# Patient Record
Sex: Female | Born: 1945 | Race: White | Hispanic: No | Marital: Married | Smoking: Former smoker
Health system: Southern US, Community
[De-identification: ages and names within clinical notes are randomized; demographics above are authoritative.]

## PROBLEM LIST (undated history)

## (undated) DIAGNOSIS — K219 Gastro-esophageal reflux disease without esophagitis: Secondary | ICD-10-CM

## (undated) DIAGNOSIS — K5900 Constipation, unspecified: Secondary | ICD-10-CM

## (undated) DIAGNOSIS — Z8619 Personal history of other infectious and parasitic diseases: Secondary | ICD-10-CM

## (undated) DIAGNOSIS — J449 Chronic obstructive pulmonary disease, unspecified: Secondary | ICD-10-CM

## (undated) DIAGNOSIS — N189 Chronic kidney disease, unspecified: Secondary | ICD-10-CM

## (undated) DIAGNOSIS — I6783 Posterior reversible encephalopathy syndrome: Secondary | ICD-10-CM

## (undated) DIAGNOSIS — I739 Peripheral vascular disease, unspecified: Secondary | ICD-10-CM

## (undated) DIAGNOSIS — J189 Pneumonia, unspecified organism: Secondary | ICD-10-CM

## (undated) DIAGNOSIS — E039 Hypothyroidism, unspecified: Secondary | ICD-10-CM

## (undated) DIAGNOSIS — F32A Depression, unspecified: Secondary | ICD-10-CM

## (undated) DIAGNOSIS — M1990 Unspecified osteoarthritis, unspecified site: Secondary | ICD-10-CM

## (undated) DIAGNOSIS — F329 Major depressive disorder, single episode, unspecified: Secondary | ICD-10-CM

## (undated) DIAGNOSIS — E785 Hyperlipidemia, unspecified: Secondary | ICD-10-CM

## (undated) HISTORY — DX: Hypothyroidism, unspecified: E03.9

## (undated) HISTORY — PX: JOINT REPLACEMENT: SHX530

## (undated) HISTORY — PX: TUBAL LIGATION: SHX77

## (undated) HISTORY — DX: Pneumonia, unspecified organism: J18.9

## (undated) HISTORY — DX: Chronic obstructive pulmonary disease, unspecified: J44.9

## (undated) HISTORY — DX: Gastro-esophageal reflux disease without esophagitis: K21.9

## (undated) HISTORY — DX: Personal history of other infectious and parasitic diseases: Z86.19

## (undated) HISTORY — DX: Major depressive disorder, single episode, unspecified: F32.9

## (undated) HISTORY — PX: TONSILLECTOMY: SUR1361

## (undated) HISTORY — DX: Constipation, unspecified: K59.00

## (undated) HISTORY — PX: ABDOMINAL AORTIC ANEURYSM REPAIR: SUR1152

## (undated) HISTORY — DX: Posterior reversible encephalopathy syndrome: I67.83

## (undated) HISTORY — DX: Unspecified osteoarthritis, unspecified site: M19.90

## (undated) HISTORY — DX: Peripheral vascular disease, unspecified: I73.9

## (undated) HISTORY — DX: Chronic kidney disease, unspecified: N18.9

## (undated) HISTORY — DX: Depression, unspecified: F32.A

## (undated) HISTORY — PX: KNEE SURGERY: SHX244

## (undated) HISTORY — DX: Hyperlipidemia, unspecified: E78.5

---

## 1997-07-03 ENCOUNTER — Ambulatory Visit (HOSPITAL_COMMUNITY): Admission: RE | Admit: 1997-07-03 | Discharge: 1997-07-03 | Payer: Self-pay | Admitting: Family Medicine

## 1999-06-09 ENCOUNTER — Encounter: Payer: Self-pay | Admitting: Emergency Medicine

## 1999-06-09 ENCOUNTER — Inpatient Hospital Stay (HOSPITAL_COMMUNITY): Admission: EM | Admit: 1999-06-09 | Discharge: 1999-06-11 | Payer: Self-pay | Admitting: Emergency Medicine

## 1999-06-10 ENCOUNTER — Encounter: Payer: Self-pay | Admitting: Family Medicine

## 1999-06-27 ENCOUNTER — Other Ambulatory Visit: Admission: RE | Admit: 1999-06-27 | Discharge: 1999-06-27 | Payer: Self-pay | Admitting: Family Medicine

## 2000-09-08 ENCOUNTER — Encounter: Payer: Self-pay | Admitting: Family Medicine

## 2000-09-08 ENCOUNTER — Encounter: Admission: RE | Admit: 2000-09-08 | Discharge: 2000-09-08 | Payer: Self-pay | Admitting: Family Medicine

## 2003-06-02 ENCOUNTER — Other Ambulatory Visit: Admission: RE | Admit: 2003-06-02 | Discharge: 2003-06-02 | Payer: Self-pay | Admitting: Family Medicine

## 2004-05-13 ENCOUNTER — Encounter: Admission: RE | Admit: 2004-05-13 | Discharge: 2004-05-13 | Payer: Self-pay | Admitting: Family Medicine

## 2004-06-21 ENCOUNTER — Ambulatory Visit: Payer: Self-pay | Admitting: Internal Medicine

## 2004-08-05 ENCOUNTER — Ambulatory Visit: Payer: Self-pay | Admitting: Internal Medicine

## 2004-08-06 ENCOUNTER — Ambulatory Visit: Payer: Self-pay | Admitting: Internal Medicine

## 2005-06-18 ENCOUNTER — Other Ambulatory Visit: Admission: RE | Admit: 2005-06-18 | Discharge: 2005-06-18 | Payer: Self-pay | Admitting: Family Medicine

## 2006-02-18 ENCOUNTER — Emergency Department (HOSPITAL_COMMUNITY): Admission: EM | Admit: 2006-02-18 | Discharge: 2006-02-18 | Payer: Self-pay | Admitting: Emergency Medicine

## 2006-06-26 ENCOUNTER — Other Ambulatory Visit: Admission: RE | Admit: 2006-06-26 | Discharge: 2006-06-26 | Payer: Self-pay | Admitting: Family Medicine

## 2007-08-02 ENCOUNTER — Other Ambulatory Visit: Admission: RE | Admit: 2007-08-02 | Discharge: 2007-08-02 | Payer: Self-pay | Admitting: Family Medicine

## 2008-08-02 ENCOUNTER — Encounter: Admission: RE | Admit: 2008-08-02 | Discharge: 2008-08-02 | Payer: Self-pay | Admitting: Family Medicine

## 2009-08-03 ENCOUNTER — Encounter: Payer: Self-pay | Admitting: Family Medicine

## 2009-08-03 ENCOUNTER — Encounter (INDEPENDENT_AMBULATORY_CARE_PROVIDER_SITE_OTHER): Payer: Self-pay | Admitting: *Deleted

## 2009-08-03 LAB — CONVERTED CEMR LAB
ALT: 15 units/L
AST: 22 units/L
Alkaline Phosphatase: 62 units/L
Cholesterol: 211 mg/dL
Potassium, serum: 4.7 mmol/L
Sodium, serum: 144 mmol/L
Total Bilirubin: 0.4 mg/dL

## 2009-10-16 ENCOUNTER — Encounter (INDEPENDENT_AMBULATORY_CARE_PROVIDER_SITE_OTHER): Payer: Self-pay | Admitting: *Deleted

## 2009-10-16 ENCOUNTER — Encounter: Payer: Self-pay | Admitting: Family Medicine

## 2009-10-16 LAB — CONVERTED CEMR LAB: Free T4: 1.68 ng/dL

## 2009-12-10 ENCOUNTER — Encounter: Admission: RE | Admit: 2009-12-10 | Discharge: 2009-12-10 | Payer: Self-pay | Admitting: Family Medicine

## 2009-12-26 ENCOUNTER — Observation Stay (HOSPITAL_COMMUNITY): Admission: EM | Admit: 2009-12-26 | Discharge: 2009-12-27 | Payer: Self-pay | Admitting: Emergency Medicine

## 2009-12-26 ENCOUNTER — Ambulatory Visit: Payer: Self-pay | Admitting: Cardiology

## 2009-12-27 ENCOUNTER — Ambulatory Visit (HOSPITAL_COMMUNITY): Admission: RE | Admit: 2009-12-27 | Discharge: 2009-12-27 | Payer: Self-pay | Admitting: Cardiology

## 2010-01-14 ENCOUNTER — Encounter: Payer: Self-pay | Admitting: Family Medicine

## 2010-01-14 ENCOUNTER — Encounter (INDEPENDENT_AMBULATORY_CARE_PROVIDER_SITE_OTHER): Payer: Self-pay | Admitting: *Deleted

## 2010-01-14 LAB — CONVERTED CEMR LAB
CO2, serum: 25 mmol/L
Calcium: 9.7 mg/dL
HCT: 41.2 %
MCV: 88 fL
Platelets: 382 10*3/uL
Sodium, serum: 134 mmol/L

## 2010-01-22 ENCOUNTER — Encounter: Payer: Self-pay | Admitting: Family Medicine

## 2010-01-30 ENCOUNTER — Encounter (INDEPENDENT_AMBULATORY_CARE_PROVIDER_SITE_OTHER): Payer: Self-pay | Admitting: *Deleted

## 2010-01-30 ENCOUNTER — Encounter: Payer: Self-pay | Admitting: Family Medicine

## 2010-01-30 LAB — CONVERTED CEMR LAB
Free T4: 1.7 ng/dL
HDL: 49 mg/dL
LDL Cholesterol: 111 mg/dL
TSH: 1.43 microintl units/mL

## 2010-03-05 ENCOUNTER — Encounter (INDEPENDENT_AMBULATORY_CARE_PROVIDER_SITE_OTHER): Payer: Self-pay | Admitting: *Deleted

## 2010-03-12 ENCOUNTER — Ambulatory Visit: Payer: Self-pay | Admitting: Family Medicine

## 2010-03-21 ENCOUNTER — Ambulatory Visit
Admission: RE | Admit: 2010-03-21 | Discharge: 2010-03-21 | Payer: Self-pay | Source: Home / Self Care | Attending: Family Medicine | Admitting: Family Medicine

## 2010-03-21 DIAGNOSIS — J309 Allergic rhinitis, unspecified: Secondary | ICD-10-CM | POA: Insufficient documentation

## 2010-03-21 DIAGNOSIS — J4489 Other specified chronic obstructive pulmonary disease: Secondary | ICD-10-CM | POA: Insufficient documentation

## 2010-03-21 DIAGNOSIS — E785 Hyperlipidemia, unspecified: Secondary | ICD-10-CM | POA: Insufficient documentation

## 2010-03-21 DIAGNOSIS — J449 Chronic obstructive pulmonary disease, unspecified: Secondary | ICD-10-CM | POA: Insufficient documentation

## 2010-03-21 DIAGNOSIS — K219 Gastro-esophageal reflux disease without esophagitis: Secondary | ICD-10-CM | POA: Insufficient documentation

## 2010-03-21 DIAGNOSIS — F32A Depression, unspecified: Secondary | ICD-10-CM | POA: Insufficient documentation

## 2010-03-21 DIAGNOSIS — R32 Unspecified urinary incontinence: Secondary | ICD-10-CM | POA: Insufficient documentation

## 2010-03-21 DIAGNOSIS — J45909 Unspecified asthma, uncomplicated: Secondary | ICD-10-CM | POA: Insufficient documentation

## 2010-03-21 DIAGNOSIS — F329 Major depressive disorder, single episode, unspecified: Secondary | ICD-10-CM | POA: Insufficient documentation

## 2010-03-22 ENCOUNTER — Encounter (INDEPENDENT_AMBULATORY_CARE_PROVIDER_SITE_OTHER): Payer: Self-pay | Admitting: *Deleted

## 2010-03-26 DIAGNOSIS — E039 Hypothyroidism, unspecified: Secondary | ICD-10-CM | POA: Insufficient documentation

## 2010-04-18 NOTE — Letter (Signed)
Summary: New Patient Letter   at [place]  [address] [place], [place] [address]   Phone: [phone]  Fax: [phone]       03/05/2010 MRN: [id number]  [facility] [address] [place], [place]  [address]  Dear Ms. Mohrmann,   Welcome to [facility] and thank you for choosing [place] as your Primary Care Providers. Enclosed you will find information about our practice that we hope you find helpful. We have also enclosed forms to be filled out prior to your visit. This will provide [place] with the necessary information and facilitate your being seen in a timely manner. If you have any questions, please call us at: [phone] and we will be happy to assist you. We look forward to seeing you at your scheduled appointment time.  Appointment March 21, 2010 at 10:00am  with Dr. Neena Rhymes   Sincerely,  Primary Health Care Team  Please arrive 15 minutes early for your first appointment and bring your insurance card. Co-pay is required at the time of your visit.  *****Please call the office if you are not able to keep this appointment. There is a charge of $50.00 if any appointment is not cancelled or rescheduled within 24 hours*****

## 2010-04-18 NOTE — Assessment & Plan Note (Signed)
Summary: NEW TO EST,NO PROBLEMS,MEDICARE & UHC/RH......   Vital Signs:  Patient profile:   65 year old female Height:      67 inches Weight:      173 pounds BMI:     27.19 Pulse rate:   89 / minute BP sitting:   110 / 68  (left arm)  Vitals Entered By: Doristine Devoid CMA (March 21, 2010 9:57 AM) CC: NEW EST- no concerns    History of Present Illness: 65 yo woman here today to establish care.  previous MD- Dr Larina Bras.  Hypothyroid- on levothyroxine.  feels sxs are well controlled.  had lab work done in Nov.  denies heat or cold intolerance.  Hyperlipidemia- on lipofen and simvastatin.  previously on Lipitor.  last labs done in Nov.  no abd pain, N/V, myalgias.  COPD- still smoking 1 ppd, using Spiriva, asmanex, ventolin.  was hospitalized in Oct for PNA.  has seen Dr Sherene Sires in the past (2004).  GERD- on omeprazole 1-2 daily.  good control of sxs.  health maintainence- due for pap.  has never had a mammogram- 'at my age, i don't think so'.  has never had colonoscopy- refuses.  Preventive Screening-Counseling & Management  Alcohol-Tobacco     Smoking Status: current     Smoking Cessation Counseling: yes     Smoke Cessation Stage: contemplative  Caffeine-Diet-Exercise     Does Patient Exercise: no      Sexual History:  currently monogamous.        Drug Use:  never.    Current Medications (verified): 1)  Spiriva Handihaler 18 Mcg Caps (Tiotropium Bromide Monohydrate) .... Take One Capsule Daily 2)  Lipofen 150 Mg Caps (Fenofibrate) .... Take One Tablet Daily 3)  Simvastatin 40 Mg Tabs (Simvastatin) .... Take One Tablet Daily 4)  Levothyroxine Sodium 88 Mcg Tabs (Levothyroxine Sodium) .... Take One Tablet Daily 5)  Fluticasone Propionate 50 Mcg/act Susp (Fluticasone Propionate) .... Use As Needed 6)  Ventolin Hfa 108 (90 Base) Mcg/act Aers (Albuterol Sulfate) .... 2 Puffs As Needed 7)  Omeprazole 20 Mg Cpdr (Omeprazole) .... Take 1-2 Tablets Daily 8)  Mucus Relief 400 Mg  Tabs (Guaifenesin) .... Take One Tablet Every 4 Hours As Needed 9)  Asmanex 120 Metered Doses 220 Mcg/inh Aepb (Mometasone Furoate) .... 2 Inhalations At Bedtime  Allergies (verified): No Known Drug Allergies  Past History:  Past Medical History: Arthritis Allergic rhinitis Asthma Depression GERD Hyperlipidemia Hyperthyroidism Urinary incontinence hx of Chicken Pox COPD Pneumonia Chest pain-[place] cards  Past Surgical History: Tubal ligation Tonsillectomy R knee surgery  Family History: CAD-mother HTN-no DM-no STROKE-mother COLON CA-no BREAST CA-no  Social History: married lives w/ mother-in-law 3 children locally retired bartenderSmoking Status:  current Does Patient Exercise:  no Sexual History:  currently monogamous Drug Use:  never  Review of Systems General:  Denies chills, fatigue, fever, loss of appetite, malaise, sleep disorder, sweats, weakness, and weight loss. Eyes:  Denies blurring and double vision. CV:  Denies chest pain or discomfort, fainting, fatigue, palpitations, shortness of breath with exertion, swelling of feet, and swelling of hands. Resp:  Denies cough, shortness of breath, and sputum productive. GI:  Denies abdominal pain, diarrhea, nausea, and vomiting. Neuro:  Denies headaches. Psych:  Denies anxiety and depression. Endo:  Denies cold intolerance and heat intolerance.  Physical Exam  General:  Well-developed,well-nourished,in no acute distress; alert,appropriate and cooperative throughout examination Head:  Normocephalic and atraumatic without obvious abnormalities. No apparent alopecia or balding. Eyes:  PERRL,  EOMI Neck:  No deformities, masses, or tenderness noted. Lungs:  Normal respiratory effort, chest expands symmetrically. Lungs are clear to auscultation, no crackles or wheezes. Heart:  Normal rate and regular rhythm. S1 and S2 normal without gallop, murmur, click, rub or other extra sounds. Abdomen:  soft, NT/ND,  +BS Pulses:  +2 carotid, radial, DP Extremities:  no C/C/E Neurologic:  alert & oriented X3, cranial nerves II-XII intact, gait normal, and DTRs symmetrical and normal.   Skin:  turgor normal and color normal.   Cervical Nodes:  No lymphadenopathy noted Psych:  Cognition and judgment appear intact. Alert and cooperative with normal attention span and concentration. No apparent delusions, illusions, hallucinations   Impression & Recommendations:  Problem # 1:  HYPOTHYROIDISM (ICD-244.9) Assessment New pt reports recent labs were normal.  will review old records.  continue to check meds every 6-12 months.  no changes in meds at this time. Her updated medication list for this problem includes:    Levothyroxine Sodium 88 Mcg Tabs (Levothyroxine sodium) .Marland Kitchen... Take one tablet daily  Problem # 2:  HYPERLIPIDEMIA (ICD-272.4) Assessment: New  tolerating meds w/out difficulty at this time.  pt due for labs in April.  will adjust meds then as needed. Her updated medication list for this problem includes:    Lipofen 150 Mg Caps (Fenofibrate) .Marland Kitchen... Take one tablet daily    Simvastatin 40 Mg Tabs (Simvastatin) .Marland Kitchen... Take one tablet daily  Orders: Prescription Created Electronically [phone])  Problem # 3:  COPD (ICD-496) Assessment: New has previously seen pulmonary but not for years.  PCP has been managing sxs w/ success.  encouraged smoking cessation.  continue current meds.  no wheezing or cough today. Her updated medication list for this problem includes:    Spiriva Handihaler 18 Mcg Caps (Tiotropium bromide monohydrate) .Marland Kitchen... Take one capsule daily    Ventolin Hfa 108 (90 Base) Mcg/act Aers (Albuterol sulfate) .Marland Kitchen... 2 puffs as needed    Asmanex 120 Metered Doses 220 Mcg/inh Aepb (Mometasone furoate) .Marland Kitchen... 2 inhalations at bedtime  Problem # 4:  GERD (ICD-530.81) Assessment: New sxs well controlled on current meds. Her updated medication list for this problem includes:    Omeprazole 20 Mg  Cpdr (Omeprazole) .Marland Kitchen... Take 1-2 tablets daily  Complete Medication List: 1)  Spiriva Handihaler 18 Mcg Caps (Tiotropium bromide monohydrate) .... Take one capsule daily 2)  Lipofen 150 Mg Caps (Fenofibrate) .... Take one tablet daily 3)  Simvastatin 40 Mg Tabs (Simvastatin) .... Take one tablet daily 4)  Levothyroxine Sodium 88 Mcg Tabs (Levothyroxine sodium) .... Take one tablet daily 5)  Fluticasone Propionate 50 Mcg/act Susp (Fluticasone propionate) .... Use as needed 6)  Ventolin Hfa 108 (90 Base) Mcg/act Aers (Albuterol sulfate) .... 2 puffs as needed 7)  Omeprazole 20 Mg Cpdr (Omeprazole) .... Take 1-2 tablets daily 8)  Mucus Relief 400 Mg Tabs (Guaifenesin) .... Take one tablet every 4 hours as needed 9)  Asmanex 120 Metered Doses 220 Mcg/inh Aepb (Mometasone furoate) .... 2 inhalations at bedtime  Patient Instructions: 1)  Please schedule your complete physical in May- do not eat before this appt 2)  We'll review your chart and see if there are any med changes we need to make 3)  Try and quit smoking!  You can do it! 4)  Try and get regular exercise 5)  Make an appt with the dentisit 6)  Call with any questions or concerns 7)  Welcome!  We're glad to have you! Prescriptions: SIMVASTATIN 40 MG  TABS (SIMVASTATIN) take one tablet daily  #30 x 6   Entered and Authorized by:   Neena Rhymes MD   Signed by:   Neena Rhymes MD on 03/21/2010   Method used:   Electronically to        [facility] [address] [phone]* (retail)       [address] [place], [place]  [address]       Ph: [phone]       Fax: [phone]   RxID:   [id number] LIPOFEN 150 MG CAPS (FENOFIBRATE) take one tablet daily  #30 x 6   Entered and Authorized by:   Neena Rhymes MD   Signed by:   Neena Rhymes MD on 03/21/2010   Method used:   Electronically to        [facility] [address] [phone]* (retail)       [address] [place], [place]  [address]       Ph:  [phone]       Fax: [phone]   RxID:   [id number]    Orders Added: 1)  Prescription Created Electronically [G8553] 2)  New Patient Level III [93235]   Immunization History:  Pneumovax Immunization History:    Pneumovax:  historical (03/17/2008)   Immunization History:  Pneumovax Immunization History:    Pneumovax:  Historical (03/17/2008)

## 2010-04-18 NOTE — Miscellaneous (Signed)
  Clinical Lists Changes  Observations: Added new observation of TSH: 1.430 microintl units/mL (01/30/2010 16:16) Added new observation of T4, FREE: 1.70 ng/dL (04/54/0981 19:14) Added new observation of TRIGLYC TOT: 127 mg/dL (78/29/5621 30:86) Added new observation of LDL: 111 mg/dL (57/84/6962 95:28) Added new observation of HDL: 49 mg/dL (41/32/4401 02:72) Added new observation of CHOLESTEROL: 185 mg/dL (53/66/4403 47:42) Added new observation of CALCIUM: 9.7 mg/dL (59/56/3875 64:33) Added new observation of GLUCOSE SER: 94 mg/dL (29/51/8841 66:06) Added new observation of CREATININE: 0.80 mg/dL (30/16/0109 32:35) Added new observation of BUN: 10 mg/dL (57/32/2025 42:70) Added new observation of CO2 TOTAL: 25 mmol/L (01/14/2010 16:16) Added new observation of CHLORIDE: 99 mmol/L (01/14/2010 16:16) Added new observation of POTASSIUM: 3.6 mmol/L (01/14/2010 16:16) Added new observation of SODIUM: 134 mmol/L (01/14/2010 16:16) Added new observation of PLATELETS: 382 10*3/mm3 (01/14/2010 16:16) Added new observation of MCH: 30.3 pg (01/14/2010 16:16) Added new observation of MCV: 88 fL (01/14/2010 16:16) Added new observation of HCT: 41.2 % (01/14/2010 16:16) Added new observation of HGB: 14.2 g/dL (62/37/6283 15:17) Added new observation of RBC: 4.68 10*6/mm3 (01/14/2010 16:16) Added new observation of WBC: 8.8 10*3/mm3 (01/14/2010 16:16) Added new observation of TSH: 1.170 microintl units/mL (10/16/2009 16:16) Added new observation of T4, FREE: 1.68 ng/dL (61/60/7371 06:26) Added new observation of TSH: 1.100 microintl units/mL (08/03/2009 16:16) Added new observation of T4, FREE: 1.85 ng/dL (94/85/4627 03:50) Added new observation of TRIGLYC TOT: 113 mg/dL (09/38/1829 93:71) Added new observation of LDL: 123 mg/dL (69/67/8938 10:17) Added new observation of HDL: 65 mg/dL (51/04/5850 77:82) Added new observation of CHOLESTEROL: 211 mg/dL (42/35/3614 43:15) Added new observation of  BILI TOTAL: 0.4 mg/dL (40/10/6759 95:09) Added new observation of ALK PHOS: 62 units/L (08/03/2009 16:16) Added new observation of SGPT (ALT): 15 units/L (08/03/2009 16:16) Added new observation of SGOT (AST): 22 units/L (08/03/2009 16:16) Added new observation of PROTEIN, TOT: 7.0 g/dL (32/67/1245 80:99) Added new observation of GLOBULIN TOT: 2.6 g/dL (83/38/2505 39:76) Added new observation of ALBUMIN: 4.4 g/dL (73/41/9379 02:40) Added new observation of CALCIUM: 9.9 mg/dL (97/35/3299 24:26) Added new observation of CO2 TOTAL: 23 mmol/L (08/03/2009 16:16) Added new observation of CHLORIDE: 102 mmol/L (08/03/2009 16:16) Added new observation of POTASSIUM: 4.7 mmol/L (08/03/2009 16:16) Added new observation of SODIUM: 144 mmol/L (08/03/2009 16:16)

## 2010-05-09 ENCOUNTER — Telehealth (INDEPENDENT_AMBULATORY_CARE_PROVIDER_SITE_OTHER): Payer: Self-pay | Admitting: *Deleted

## 2010-05-14 NOTE — Progress Notes (Signed)
Summary: refill  Phone Note Refill Request Message from:  Fax from Pharmacy on May 09, 2010 3:17 PM  Refills Requested: Medication #1:  LEVOTHYROXINE SODIUM 88 MCG TABS take one tablet daily [facility] - fax [phone]  Initial call taken by: Okey Regal [place],  May 09, 2010 3:18 PM    Prescriptions: LEVOTHYROXINE SODIUM 88 MCG TABS (LEVOTHYROXINE SODIUM) take one tablet daily  #30 x 5   Entered by:   Doristine Devoid CMA   Authorized by:   Neena Rhymes MD   Signed by:   Doristine Devoid CMA on 05/09/2010   Method used:   Electronically to        [facility] [address] [phone]* (retail)       [address] [place], [place]  [address]       Ph: [phone]       Fax: [phone]   RxID:   [id number]

## 2010-05-30 LAB — CARDIAC PANEL(CRET KIN+CKTOT+MB+TROPI)
CK, MB: 0.4 ng/mL (ref 0.3–4.0)
CK, MB: 0.5 ng/mL (ref 0.3–4.0)
Relative Index: INVALID (ref 0.0–2.5)
Relative Index: INVALID (ref 0.0–2.5)
Total CK: 27 U/L (ref 7–177)

## 2010-05-30 LAB — DIFFERENTIAL
Basophils Absolute: 0 10*3/uL (ref 0.0–0.1)
Basophils Relative: 0 % (ref 0–1)
Monocytes Absolute: 0.9 10*3/uL (ref 0.1–1.0)
Neutro Abs: 7.7 10*3/uL (ref 1.7–7.7)
Neutrophils Relative %: 78 % — ABNORMAL HIGH (ref 43–77)

## 2010-05-30 LAB — POCT CARDIAC MARKERS: Myoglobin, poc: 50.1 ng/mL (ref 12–200)

## 2010-05-30 LAB — LIPID PANEL
Cholesterol: 189 mg/dL (ref 0–200)
HDL: 58 mg/dL (ref >39)

## 2010-05-30 LAB — TSH: TSH: 1.031 u[IU]/mL (ref 0.350–4.500)

## 2010-05-30 LAB — POCT I-STAT, CHEM 8
Calcium, Ion: 1.24 mmol/L (ref 1.12–1.32)
Creatinine, Ser: 0.7 mg/dL (ref 0.4–1.2)
Hemoglobin: 15.6 g/dL — ABNORMAL HIGH (ref 12.0–15.0)
Sodium: 139 mEq/L (ref 135–145)
TCO2: 24 mmol/L (ref 0–100)

## 2010-05-30 LAB — GLUCOSE, CAPILLARY
Glucose-Capillary: 126 mg/dL — ABNORMAL HIGH (ref 70–99)
Glucose-Capillary: 131 mg/dL — ABNORMAL HIGH (ref 70–99)

## 2010-05-30 LAB — T4, FREE: Free T4: 1.25 ng/dL (ref 0.80–1.80)

## 2010-05-30 LAB — CBC
MCHC: 34.6 g/dL (ref 30.0–36.0)
RDW: 12.8 % (ref 11.5–15.5)

## 2010-07-11 ENCOUNTER — Encounter: Payer: Self-pay | Admitting: Family Medicine

## 2010-07-22 ENCOUNTER — Encounter: Payer: Self-pay | Admitting: Family Medicine

## 2010-07-22 ENCOUNTER — Ambulatory Visit (INDEPENDENT_AMBULATORY_CARE_PROVIDER_SITE_OTHER): Payer: Medicare Other | Admitting: Family Medicine

## 2010-07-22 VITALS — BP 100/70 | Temp 98.4°F | Wt 168.2 lb

## 2010-07-22 DIAGNOSIS — F17200 Nicotine dependence, unspecified, uncomplicated: Secondary | ICD-10-CM

## 2010-07-22 DIAGNOSIS — E039 Hypothyroidism, unspecified: Secondary | ICD-10-CM

## 2010-07-22 DIAGNOSIS — R634 Abnormal weight loss: Secondary | ICD-10-CM

## 2010-07-22 DIAGNOSIS — Z0000 Encounter for general adult medical examination without abnormal findings: Secondary | ICD-10-CM

## 2010-07-22 DIAGNOSIS — E785 Hyperlipidemia, unspecified: Secondary | ICD-10-CM

## 2010-07-22 LAB — CBC WITH DIFFERENTIAL/PLATELET
Basophils Absolute: 0 10*3/uL (ref 0.0–0.1)
Eosinophils Relative: 1.4 % (ref 0.0–5.0)
Lymphocytes Relative: 43.3 % (ref 12.0–46.0)
Monocytes Relative: 7.4 % (ref 3.0–12.0)
Platelets: 231 10*3/uL (ref 150.0–400.0)
RDW: 13 % (ref 11.5–14.6)
WBC: 5.7 10*3/uL (ref 4.5–10.5)

## 2010-07-22 LAB — LIPID PANEL
Cholesterol: 187 mg/dL (ref 0–200)
HDL: 48.6 mg/dL (ref >39.00)
Triglycerides: 103 mg/dL (ref 0.0–149.0)
VLDL: 20.6 mg/dL (ref 0.0–40.0)

## 2010-07-22 LAB — BASIC METABOLIC PANEL
CO2: 28 mEq/L (ref 19–32)
Calcium: 9.2 mg/dL (ref 8.4–10.5)
Chloride: 103 mEq/L (ref 96–112)
Glucose, Bld: 77 mg/dL (ref 70–99)
Potassium: 4 mEq/L (ref 3.5–5.1)
Sodium: 141 mEq/L (ref 135–145)

## 2010-07-22 LAB — HEPATIC FUNCTION PANEL
ALT: 17 U/L (ref 0–35)
AST: 22 U/L (ref 0–37)
Alkaline Phosphatase: 59 U/L (ref 39–117)
Bilirubin, Direct: 0 mg/dL (ref 0.0–0.3)
Total Protein: 6.8 g/dL (ref 6.0–8.3)

## 2010-07-22 NOTE — Patient Instructions (Signed)
Follow up in 3 months to recheck weight We'll notify you of your lab results Try and decrease your smoking Call with any questions or concerns Hang in there!!!

## 2010-07-22 NOTE — Progress Notes (Signed)
  Subjective:    Patient ID: Amy Bates, female    DOB: 01-09-1946, 65 y.o.   MRN: [id number]  HPI Here today for CPE.  Risk Factors: Hyperlipidemia- chronic problem for pt, doing well on simvastatin and Lipofen.  No abd pain, N/V, myalgias [place] use- still smoking >1 ppd.  Used patches w/out results. Hypothyroid- chronic problem for pt, on synthroid .  Pt reports she is losing weight and is wondering if dose is correct.  Recently had dose reduced. Weight loss- has lost 5 lbs since January.  Pt reports loss is gradual.  Is eating well and not following any particular diet- 'i eat whatever i want and it's usually not good for me'.  Physical Activity: walking regularly Depression: pt admits to slight depression, feels this is related to fatigue.  Denies SI/HI Hearing: normal to whispered voice at 6 ft ADL's: independent Cognitive: normal linear thought process, memory intact, no deficits noted Home Safety: feels safe at home, lives w/ husband and mother-in-law Height, Weight, BMI, Visual Acuity: see vitals, vision corrected to 20/20 w/ glasses Counseling: provided on health maintenance- refusing colonoscopy/mammo/DEXA.  Prefers to wait another year on pap. Labs Ordered: See A&P Care Plan: See A&P    Review of Systems Patient reports no vision/ hearing changes, adenopathy,fever,  persistant/recurrent hoarseness , swallowing issues, chest pain, palpitations, edema, persistant/recurrent cough, hemoptysis, gastrointestinal bleeding (melena, rectal bleeding), abdominal pain, significant heartburn, bowel changes, GU symptoms (dysuria, hematuria, incontinence), Gyn symptoms (abnormal  bleeding, pain),  syncope, focal weakness, memory loss, numbness & tingling, skin/hair/nail changes, abnormal bruising or bleeding.  + weight loss, dyspnea w/ exertion, mild depression    Objective:   Physical Exam  General Appearance:    Alert, cooperative, no distress, appears stated age  Head:     Normocephalic, without obvious abnormality, atraumatic  Eyes:    PERRL, conjunctiva/corneas clear, EOM's intact, fundi    benign, both eyes  Ears:    Normal TM's and external ear canals, both ears  Nose:   Nares normal, septum midline, mucosa normal, no drainage    or sinus tenderness  Throat:   Lips, mucosa, and tongue normal; teeth and gums normal  Neck:   Supple, symmetrical, trachea midline, no adenopathy;    Thyroid: no enlargement/tenderness/nodules  Back:     Symmetric, no curvature, ROM normal, no CVA tenderness  Lungs:     Clear to auscultation bilaterally, respirations unlabored  Chest Wall:    No tenderness or deformity   Heart:    Regular rate and rhythm, S1 and S2 normal, no murmur, rub   or gallop  Breast Exam:    No tenderness, masses, or nipple abnormality  Abdomen:     Soft, non-tender, bowel sounds active all four quadrants,    no masses, no organomegaly  Genitalia:    Deferred at pt's request     Extremities:   Extremities normal, atraumatic, no cyanosis or edema  Pulses:   2+ and symmetric all extremities  Skin:   Skin color, texture, turgor normal, no rashes or lesions  Lymph nodes:   Cervical, supraclavicular, and axillary nodes normal  Neurologic:   CNII-XII intact, normal strength, sensation and reflexes    throughout          Assessment & Plan:

## 2010-07-30 DIAGNOSIS — F17200 Nicotine dependence, unspecified, uncomplicated: Secondary | ICD-10-CM | POA: Insufficient documentation

## 2010-07-30 NOTE — Assessment & Plan Note (Signed)
Due for labs.  Tolerating meds w/out difficulty.  Adjust meds prn.

## 2010-07-30 NOTE — Assessment & Plan Note (Signed)
Encouraged pt to quit smoking.  Pt reports she will attempt the patches again and rededicate herself to the effort.  Will follow.

## 2010-07-30 NOTE — Assessment & Plan Note (Signed)
Given recent weight loss and dose adjustment will check labs to ensure that meds are appropriate.

## 2010-07-30 NOTE — Assessment & Plan Note (Signed)
Pt reports this is gradual despite maintaining a good appetite and eating poorly.  Will follow closely.  Labs pending.

## 2010-07-30 NOTE — Assessment & Plan Note (Signed)
Pt's PE WNL.  Discussed importance of pap, mammo, colonoscopy, dexa but pt refusing.  Reviewed POA and living will.  Anticipatory guidance provided.

## 2010-09-03 ENCOUNTER — Other Ambulatory Visit: Payer: Self-pay | Admitting: *Deleted

## 2010-09-03 MED ORDER — ALBUTEROL SULFATE HFA 108 (90 BASE) MCG/ACT IN AERS: 2.0000 | INHALATION_SPRAY | RESPIRATORY_TRACT | Status: DC | PRN

## 2010-09-03 NOTE — Telephone Encounter (Signed)
Refill sent.

## 2010-10-22 ENCOUNTER — Encounter: Payer: Self-pay | Admitting: Family Medicine

## 2010-10-22 ENCOUNTER — Ambulatory Visit (INDEPENDENT_AMBULATORY_CARE_PROVIDER_SITE_OTHER): Payer: Medicare Other | Admitting: Family Medicine

## 2010-10-22 DIAGNOSIS — R634 Abnormal weight loss: Secondary | ICD-10-CM

## 2010-10-22 DIAGNOSIS — G4700 Insomnia, unspecified: Secondary | ICD-10-CM

## 2010-10-22 NOTE — Patient Instructions (Signed)
Follow up in 3 months to recheck cholesterol- do not eat before this appt Try a tylenol PM and see if your sleeping improves- take only as needed Call with any questions or concerns Hang in there!

## 2010-10-22 NOTE — Progress Notes (Signed)
  Subjective:    Patient ID: Amy Bates, female    DOB: [birth year], 65 y.o.   MRN: [id number]  HPI Weight loss- pt has gained 2 lbs since last visit.  No longer losing.  Eating regularly- lots of sugary snacks.  Still smoking regularly.  Insomnia- sxs for over 1 year, will wake up between 2-4 am due to coughing and then is unable to fall asleep.  Will take daily naps.  Has never tried OTC sleep aides.  Has no trouble falling asleep.   Review of Systems For ROS see HPI     Objective:   Physical Exam  Vitals reviewed. Constitutional: She is oriented to person, place, and time. She appears well-developed and well-nourished. No distress.       Smells of cigarettes  HENT:  Head: Normocephalic and atraumatic.  Neck: Neck supple. No thyromegaly present.  Cardiovascular: Normal rate, regular rhythm, normal heart sounds and intact distal pulses.   Pulmonary/Chest: Effort normal and breath sounds normal. No respiratory distress. She has no wheezes. She has no rales.  Musculoskeletal: She exhibits no edema.  Neurological: She is alert and oriented to person, place, and time.  Skin: Skin is warm and dry.  Psychiatric: She has a normal mood and affect. Her behavior is normal. Judgment and thought content normal.          Assessment & Plan:

## 2010-10-27 DIAGNOSIS — G4700 Insomnia, unspecified: Secondary | ICD-10-CM | POA: Insufficient documentation

## 2010-10-27 NOTE — Assessment & Plan Note (Signed)
Unclear as to cause of pt's sleep disturbance but this is not a new thing for her.  Recommended OTC sleep aides prior to starting prescription meds.  Will follow.

## 2010-10-27 NOTE — Assessment & Plan Note (Signed)
Pt's weight has stabilized.  Reports she is eating regularly- including lots of sugary snacks.  Will continue to follow at future visits.

## 2010-11-08 ENCOUNTER — Other Ambulatory Visit: Payer: Self-pay | Admitting: Family Medicine

## 2010-11-08 MED ORDER — LEVOTHYROXINE SODIUM 88 MCG PO TABS: 88.0000 ug | ORAL_TABLET | Freq: Every day | ORAL | Status: DC

## 2010-11-08 NOTE — Telephone Encounter (Signed)
Rx sent 

## 2010-12-10 ENCOUNTER — Other Ambulatory Visit: Payer: Self-pay | Admitting: Family Medicine

## 2010-12-10 MED ORDER — SIMVASTATIN 40 MG PO TABS: 40.0000 mg | ORAL_TABLET | Freq: Every day | ORAL | Status: DC

## 2010-12-10 MED ORDER — FENOFIBRATE 150 MG PO CAPS: 150.0000 mg | ORAL_CAPSULE | Freq: Every day | ORAL | Status: DC

## 2010-12-10 NOTE — Telephone Encounter (Signed)
Done

## 2010-12-17 ENCOUNTER — Ambulatory Visit (INDEPENDENT_AMBULATORY_CARE_PROVIDER_SITE_OTHER): Payer: Medicare Other | Admitting: Family Medicine

## 2010-12-17 ENCOUNTER — Encounter: Payer: Self-pay | Admitting: Family Medicine

## 2010-12-17 DIAGNOSIS — J40 Bronchitis, not specified as acute or chronic: Secondary | ICD-10-CM

## 2010-12-17 MED ORDER — BENZONATATE 200 MG PO CAPS: 200.0000 mg | ORAL_CAPSULE | Freq: Three times a day (TID) | ORAL | Status: AC | PRN

## 2010-12-17 MED ORDER — AZITHROMYCIN 250 MG PO TABS: 250.0000 mg | ORAL_TABLET | Freq: Every day | ORAL | Status: AC

## 2010-12-17 NOTE — Patient Instructions (Signed)
This is likely a bronchitis Take the Azithromycin as directed Continue the mucous relief meds Use the inhalers as needed Take the Tessalon as needed for cough Drink plenty of fluids STOP SMOKING!! Call with any questions or concerns Hang in there!!!

## 2010-12-17 NOTE — Progress Notes (Signed)
  Subjective:    Patient ID: Amy Bates, female    DOB: 09-12-1945, 65 y.o.   MRN: [id number]  HPI Cough- sxs started last week.  Cough is productive of thick 'like jello' sputum.  'coughed so hard yesterday i almost passed out'.  No fevers, ear pain, nasal congestion, facial pain.  + sick contacts.  Smoking 1.5 ppd.   Review of Systems For ROS see HPI     Objective:   Physical Exam  Vitals reviewed. Constitutional: She appears well-developed and well-nourished. No distress.  HENT:  Head: Normocephalic and atraumatic.       TMs normal bilaterally Mild nasal congestion Throat w/out erythema, edema, or exudate  Eyes: Conjunctivae and EOM are normal. Pupils are equal, round, and reactive to light.  Neck: Normal range of motion. Neck supple.  Cardiovascular: Normal rate, regular rhythm, normal heart sounds and intact distal pulses.   No murmur heard. Pulmonary/Chest: Effort normal and breath sounds normal. No respiratory distress. She has no wheezes.       + hacking cough  Lymphadenopathy:    She has no cervical adenopathy.          Assessment & Plan:

## 2010-12-17 NOTE — Assessment & Plan Note (Signed)
Given smoking hx and duration of cough w/out improvement will start abx.  Reviewed supportive care and red flags that should prompt return.  Pt expressed understanding and is in agreement w/ plan.

## 2010-12-23 ENCOUNTER — Telehealth: Payer: Self-pay | Admitting: *Deleted

## 2010-12-23 MED ORDER — CLARITHROMYCIN ER 500 MG PO TB24: 1000.0000 mg | ORAL_TABLET | Freq: Every day | ORAL | Status: DC

## 2010-12-23 NOTE — Telephone Encounter (Signed)
Since pt feels she has not improved we can switch her to biaxin xl 500mg - 2 tabs daily x10 days.  Continue the Mucinex and Tessalon.

## 2010-12-23 NOTE — Telephone Encounter (Signed)
VM left on triage line stating antibiotics and tessalon perles not working.  Wants more abx or another appt. RC at 10:51am:  Pt has taken Z-pak and is using Tessalon perles.  She states neither has helped her.  She is using rescue inhaler every 2-4 hours.  She continues Flonase, generic mucinex and spiriva.  She is still smoking, but has cut down since being sick.  Pt states it has been years since she has been given an antibiotic that weak. Does pt need appt or can she have 2nd antibiotic?

## 2010-12-23 NOTE — Telephone Encounter (Signed)
Pt notified and meds sent to pharm.

## 2011-01-03 ENCOUNTER — Other Ambulatory Visit: Payer: Self-pay | Admitting: Family Medicine

## 2011-01-03 MED ORDER — ALBUTEROL SULFATE HFA 108 (90 BASE) MCG/ACT IN AERS: 2.0000 | INHALATION_SPRAY | RESPIRATORY_TRACT | Status: DC | PRN

## 2011-01-03 NOTE — Telephone Encounter (Signed)
Pt last seen 12-17-10 rx faxed

## 2011-01-23 ENCOUNTER — Encounter: Payer: Self-pay | Admitting: Family Medicine

## 2011-01-24 ENCOUNTER — Encounter: Payer: Self-pay | Admitting: Family Medicine

## 2011-01-24 ENCOUNTER — Ambulatory Visit (INDEPENDENT_AMBULATORY_CARE_PROVIDER_SITE_OTHER): Payer: Medicare Other | Admitting: Family Medicine

## 2011-01-24 VITALS — BP 125/80 | HR 83 | Temp 98.0°F | Ht 66.5 in | Wt 168.8 lb

## 2011-01-24 DIAGNOSIS — Z23 Encounter for immunization: Secondary | ICD-10-CM

## 2011-01-24 DIAGNOSIS — E785 Hyperlipidemia, unspecified: Secondary | ICD-10-CM

## 2011-01-24 LAB — HEPATIC FUNCTION PANEL
ALT: 16 U/L (ref 0–35)
AST: 25 U/L (ref 0–37)
Albumin: 4.3 g/dL (ref 3.5–5.2)
Total Bilirubin: 0.7 mg/dL (ref 0.3–1.2)
Total Protein: 7.7 g/dL (ref 6.0–8.3)

## 2011-01-24 LAB — LIPID PANEL
Cholesterol: 175 mg/dL (ref 0–200)
Triglycerides: 78 mg/dL (ref 0.0–149.0)

## 2011-01-24 NOTE — Patient Instructions (Signed)
Schedule your complete physical for May We'll notify you of your lab results Call with any questions or concerns Happy Holidays!

## 2011-01-24 NOTE — Progress Notes (Signed)
  Subjective:    Patient ID: Amy Bates, female    DOB: 1945-12-29, 65 y.o.   MRN: [id number]  HPI Hyperlipidemia- chronic problem, on Zocor, Lipofen.  Denies abd pain, N/V, myalgias.   Review of Systems For ROS see HPI     Objective:   Physical Exam  Vitals reviewed. Constitutional: She is oriented to person, place, and time. She appears well-developed and well-nourished. No distress.  HENT:  Head: Normocephalic and atraumatic.  Eyes: Conjunctivae and EOM are normal. Pupils are equal, round, and reactive to light.  Neck: Normal range of motion. Neck supple. No thyromegaly present.  Cardiovascular: Normal rate, regular rhythm, normal heart sounds and intact distal pulses.   No murmur heard. Pulmonary/Chest: Effort normal and breath sounds normal. No respiratory distress.  Abdominal: Soft. She exhibits no distension. There is no tenderness.  Musculoskeletal: She exhibits no edema.  Lymphadenopathy:    She has no cervical adenopathy.  Neurological: She is alert and oriented to person, place, and time.  Skin: Skin is warm and dry.  Psychiatric: She has a normal mood and affect. Her behavior is normal.          Assessment & Plan:

## 2011-01-26 NOTE — Assessment & Plan Note (Signed)
Chronic problem.  Check labs.  Adjust meds prn  

## 2011-01-27 ENCOUNTER — Encounter: Payer: Self-pay | Admitting: *Deleted

## 2011-02-07 ENCOUNTER — Other Ambulatory Visit: Payer: Self-pay | Admitting: Family Medicine

## 2011-02-07 MED ORDER — LEVOTHYROXINE SODIUM 88 MCG PO TABS: 88.0000 ug | ORAL_TABLET | Freq: Every day | ORAL | Status: DC

## 2011-02-07 NOTE — Telephone Encounter (Signed)
rx sent to pharmacy by e-script For synthroid

## 2011-04-17 ENCOUNTER — Telehealth: Payer: Self-pay | Admitting: *Deleted

## 2011-04-17 NOTE — Telephone Encounter (Signed)
Pt states that she got a letter from [employer] that they will not cover the following meds any more liprofen or ventolin. Pt states that letter is suggesting generic fenofibrate or proair. However Pt indication the the proair will still be to expensive for her. Advise Pt to drop off formulary and copy of letter so that we can find the appropriate med that is covered by insurance. Pt is to bring in formulary and will pick it up on FEB 11 at [facility].

## 2011-04-28 MED ORDER — FENOFIBRATE 160 MG PO TABS: 160.0000 mg | ORAL_TABLET | Freq: Every day | ORAL | Status: DC

## 2011-04-28 NOTE — Telephone Encounter (Signed)
Pt calling back to see if she will be able to pick up her insurance info and if Dr Beverely Low has had a chance to review info. Pt is to come pick up info today at 2 pm.Please advise

## 2011-04-28 NOTE — Telephone Encounter (Signed)
Called pt to advise instructions for fenofibrate and proair, pt accepted and noted pharmacy of choice [facility] on [address], pt will pick up her formulary from the front desk tommorow.

## 2011-04-28 NOTE — Telephone Encounter (Signed)
We can switch her to generic fenofibrate and the recommended proair but all inhalers will be expensive (since this is recommended it is what her insurance prefers- even w/ high cost)

## 2011-04-29 MED ORDER — ALBUTEROL SULFATE HFA 108 (90 BASE) MCG/ACT IN AERS: 2.0000 | INHALATION_SPRAY | Freq: Four times a day (QID) | RESPIRATORY_TRACT | Status: DC | PRN

## 2011-04-29 MED ORDER — FENOFIBRATE 160 MG PO TABS: 160.0000 mg | ORAL_TABLET | Freq: Every day | ORAL | Status: DC

## 2011-05-06 MED ORDER — ALBUTEROL SULFATE HFA 108 (90 BASE) MCG/ACT IN AERS: 2.0000 | INHALATION_SPRAY | Freq: Four times a day (QID) | RESPIRATORY_TRACT | Status: DC | PRN

## 2011-05-06 NOTE — Telephone Encounter (Signed)
Pt called back stating that they will not cover Proventil Rx has to be for proair. Rx sent in for proair.

## 2011-05-06 NOTE — Telephone Encounter (Signed)
Addended by: Candie Echevaria L on: 05/06/2011 04:40 PM   Modules accepted: Orders

## 2011-05-22 ENCOUNTER — Telehealth: Payer: Self-pay | Admitting: Family Medicine

## 2011-05-22 MED ORDER — LEVOTHYROXINE SODIUM 88 MCG PO TABS: 88.0000 ug | ORAL_TABLET | Freq: Every day | ORAL | Status: DC

## 2011-05-22 NOTE — Telephone Encounter (Signed)
rx sent to pharmacy by e-script  

## 2011-05-22 NOTE — Telephone Encounter (Signed)
Levothyroxine Sodium (Tab) SYNTHROID, LEVOTHROID 88 MCG Take 1 tablet (88 mcg total) by mouth daily.

## 2011-07-28 ENCOUNTER — Encounter: Payer: Self-pay | Admitting: Family Medicine

## 2011-07-28 ENCOUNTER — Ambulatory Visit (INDEPENDENT_AMBULATORY_CARE_PROVIDER_SITE_OTHER): Payer: Medicare Other | Admitting: Family Medicine

## 2011-07-28 VITALS — BP 118/76 | HR 65 | Temp 98.0°F | Ht 66.0 in | Wt 170.6 lb

## 2011-07-28 DIAGNOSIS — Z0000 Encounter for general adult medical examination without abnormal findings: Secondary | ICD-10-CM

## 2011-07-28 DIAGNOSIS — E785 Hyperlipidemia, unspecified: Secondary | ICD-10-CM

## 2011-07-28 DIAGNOSIS — E039 Hypothyroidism, unspecified: Secondary | ICD-10-CM

## 2011-07-28 LAB — CBC WITH DIFFERENTIAL/PLATELET
Eosinophils Absolute: 0.1 10*3/uL (ref 0.0–0.7)
MCHC: 33.3 g/dL (ref 30.0–36.0)
MCV: 93.2 fl (ref 78.0–100.0)
Monocytes Absolute: 0.5 10*3/uL (ref 0.1–1.0)
Neutrophils Relative %: 54.8 % (ref 43.0–77.0)
Platelets: 204 10*3/uL (ref 150.0–400.0)
RDW: 12.7 % (ref 11.5–14.6)
WBC: 7.3 10*3/uL (ref 4.5–10.5)

## 2011-07-28 LAB — BASIC METABOLIC PANEL
GFR: 78.77 mL/min (ref >60.00)
Potassium: 5 mEq/L (ref 3.5–5.1)
Sodium: 142 mEq/L (ref 135–145)

## 2011-07-28 LAB — LIPID PANEL
HDL: 57.9 mg/dL (ref >39.00)
Total CHOL/HDL Ratio: 3
Triglycerides: 97 mg/dL (ref 0.0–149.0)
VLDL: 19.4 mg/dL (ref 0.0–40.0)

## 2011-07-28 LAB — TSH: TSH: 0.39 u[IU]/mL (ref 0.35–5.50)

## 2011-07-28 LAB — HEPATIC FUNCTION PANEL
AST: 22 U/L (ref 0–37)
Alkaline Phosphatase: 53 U/L (ref 39–117)
Total Bilirubin: 0.5 mg/dL (ref 0.3–1.2)

## 2011-07-28 MED ORDER — OMEPRAZOLE 20 MG PO CPDR: 20.0000 mg | DELAYED_RELEASE_CAPSULE | Freq: Every day | ORAL | Status: DC

## 2011-07-28 NOTE — Assessment & Plan Note (Signed)
Chronic problem.  On statin w/out difficulty.  Check labs.  Adjust meds prn  

## 2011-07-28 NOTE — Patient Instructions (Signed)
Follow up in 6 months- sooner if needed We'll notify you of your lab results and make any changes if needed Call with any questions or concerns Hang in there! Happy Belated Birthday!!

## 2011-07-28 NOTE — Assessment & Plan Note (Signed)
Pt's PE WNL.  Refusing mammo (due to cost), pap and colonoscopy.  Check labs.  Anticipatory guidance provided.

## 2011-07-28 NOTE — Progress Notes (Signed)
  Subjective:    Patient ID: Amy Bates, female    DOB: 01-04-1946, 66 y.o.   MRN: [id number]  HPI Here today for CPE.  Risk Factors: Hypothyroid- chronic problem, on Synthroid.  Denies heat/cold intolerance, fatigue Hyperlipidemia- chronic problem, on Simvastatin and fenofibrate daily.  Denies abd pain, N/V, myalgias Physical Activity: daily activities but no exercise Fall Risk: none Depression: chronic problem, worsening due to mother-in-law and her behavior, not interested in meds. Hearing: normal to whispered and conversational tones at 6 ft ADL's: independent Cognitive: normal linear thought process, memory and attention intact Home Safety: safe at home, lives w/ husband and mother-in-law Height, Weight, BMI, Visual Acuity: see vitals, vision corrected to 20/20 w/ glasses Counseling: pt not interested in continuing paps, has never had mammo- will consider at a later date due to finances, has never had colonoscopy- not willing Labs Ordered: See A&P Care Plan: See A&P    Review of Systems Patient reports no vision/ hearing changes, adenopathy,fever, weight change,  persistant/recurrent hoarseness , swallowing issues, chest pain, palpitations, edema, persistant/recurrent cough, hemoptysis, dyspnea (rest/exertional/paroxysmal nocturnal), gastrointestinal bleeding (melena, rectal bleeding), abdominal pain, significant heartburn, bowel changes, GU symptoms (dysuria, hematuria, incontinence), Gyn symptoms (abnormal  bleeding, pain),  syncope, focal weakness, memory loss, numbness & tingling, skin/hair/nail changes, abnormal bruising or bleeding.     Objective:   Physical Exam  General Appearance:    Alert, cooperative, no distress, appears stated age  Head:    Normocephalic, without obvious abnormality, atraumatic  Eyes:    PERRL, conjunctiva/corneas clear, EOM's intact, fundi    benign, both eyes  Ears:    Normal TM's and external ear canals, both ears  Nose:   Nares normal, septum  midline, mucosa normal, no drainage    or sinus tenderness  Throat:   Lips, mucosa, and tongue normal; teeth and gums normal  Neck:   Supple, symmetrical, trachea midline, no adenopathy;    Thyroid: no enlargement/tenderness/nodules  Back:     Symmetric, no curvature, ROM normal, no CVA tenderness  Lungs:     Clear to auscultation bilaterally, respirations unlabored  Chest Wall:    No tenderness or deformity   Heart:    Regular rate and rhythm, S1 and S2 normal, no murmur, rub   or gallop  Breast Exam:    No tenderness, masses, or nipple abnormality  Abdomen:     Soft, non-tender, bowel sounds active all four quadrants,    no masses, no organomegaly  Genitalia:    Deferred at pt request  Rectal:    Extremities:   Extremities normal, atraumatic, no cyanosis or edema  Pulses:   2+ and symmetric all extremities  Skin:   Skin color, texture, turgor normal, no rashes or lesions  Lymph nodes:   Cervical, supraclavicular, and axillary nodes normal  Neurologic:   CNII-XII intact, normal strength, sensation and reflexes    throughout          Assessment & Plan:

## 2011-07-28 NOTE — Assessment & Plan Note (Signed)
Chronic problem.  Currently asymptomatic.  Check labs.  Adjust meds prn  

## 2011-07-29 ENCOUNTER — Encounter: Payer: Self-pay | Admitting: *Deleted

## 2011-08-04 ENCOUNTER — Telehealth: Payer: Self-pay | Admitting: *Deleted

## 2011-08-04 MED ORDER — CITALOPRAM HYDROBROMIDE 20 MG PO TABS: 20.0000 mg | ORAL_TABLET | Freq: Every day | ORAL | Status: DC

## 2011-08-04 NOTE — Telephone Encounter (Signed)
Spoke to pt to advise results/instructions. Pt understood. Sent new RX celexa 20mg  to [facility] on [address] per pt, scheduled follow up office visit for 09-04-11 at 11:15am to re-check mood, pt accepted apt

## 2011-08-04 NOTE — Telephone Encounter (Signed)
Pt states that she was recently seen and discuss that she may have some depression with some anxiety. Pt would now like to start med for this. Pt last seen on 07-28-11. Pt states that all she wants to stay in bed and cry. Pt uses [facility].Please advise

## 2011-08-04 NOTE — Telephone Encounter (Signed)
Ok to start Celexa 20mg  daily- needs OV in 1 month to recheck mood

## 2011-08-19 ENCOUNTER — Other Ambulatory Visit: Payer: Self-pay | Admitting: Family Medicine

## 2011-08-19 MED ORDER — LEVOTHYROXINE SODIUM 88 MCG PO TABS: 88.0000 ug | ORAL_TABLET | Freq: Every day | ORAL | Status: DC

## 2011-08-19 NOTE — Telephone Encounter (Signed)
rx sent to pharmacy by e-script  

## 2011-08-19 NOTE — Telephone Encounter (Signed)
refill levothyroxine 0.088mg  ( ) Tab Take one tablet by mouth daily  Qty 30 Last fill 5.5.13 Last ov 5.13.13

## 2011-08-27 ENCOUNTER — Other Ambulatory Visit: Payer: Self-pay | Admitting: Family Medicine

## 2011-08-27 MED ORDER — SIMVASTATIN 40 MG PO TABS: 40.0000 mg | ORAL_TABLET | Freq: Every day | ORAL | Status: DC

## 2011-08-27 NOTE — Telephone Encounter (Signed)
rx sent to pharmacy by e-script  

## 2011-08-27 NOTE — Telephone Encounter (Signed)
Refill for Simvastatin 40mg  tablets  Qty 30 Take one tablet by mouth daily Last fill 5.13.13 Last ov 5.13.13

## 2011-09-04 ENCOUNTER — Ambulatory Visit (INDEPENDENT_AMBULATORY_CARE_PROVIDER_SITE_OTHER): Payer: Medicare Other | Admitting: Family Medicine

## 2011-09-04 ENCOUNTER — Encounter: Payer: Self-pay | Admitting: Family Medicine

## 2011-09-04 VITALS — BP 126/80 | HR 79 | Temp 98.2°F | Ht 66.0 in | Wt 164.4 lb

## 2011-09-04 DIAGNOSIS — F329 Major depressive disorder, single episode, unspecified: Secondary | ICD-10-CM

## 2011-09-04 DIAGNOSIS — F3289 Other specified depressive episodes: Secondary | ICD-10-CM

## 2011-09-04 NOTE — Progress Notes (Signed)
  Subjective:    Patient ID: Amy Bates, female    DOB: 25-Apr-1945, 66 y.o.   MRN: [id number]  HPI Depression- switched to Celexa at last visit.  No longer having anxiety attacks, 'the chest pains have stopped'.  Sleep has improved.  Taking meds nightly due to sleepiness.  Some decreased appetite.  Husband feels there has been a big improvement.  They are looking into placing mother-in-law in assisted living.   Review of Systems For ROS see HPI     Objective:   Physical Exam  Vitals reviewed. Constitutional: She appears well-developed and well-nourished. No distress.  Psychiatric: She has a normal mood and affect. Her behavior is normal. Judgment and thought content normal.          Assessment & Plan:

## 2011-09-04 NOTE — Patient Instructions (Addendum)
Follow up in November to recheck cholesterol and blood pressure Continue the Celexa nightly Call with any questions or concerns Hang in there!!! Have a great summer!!!

## 2011-09-04 NOTE — Assessment & Plan Note (Signed)
Improved since starting celexa.  Pt and husband have both noticed an improvement.  No changes.  Will follow.

## 2011-12-08 ENCOUNTER — Telehealth: Payer: Self-pay | Admitting: Family Medicine

## 2011-12-08 MED ORDER — ALBUTEROL SULFATE HFA 108 (90 BASE) MCG/ACT IN AERS: 2.0000 | INHALATION_SPRAY | Freq: Four times a day (QID) | RESPIRATORY_TRACT | Status: DC | PRN

## 2011-12-08 NOTE — Telephone Encounter (Signed)
Refill: Proair hfa (200 puffs) 8.5 gm dos ctr. Inhale 2 puffs every 6 hours as needed for wheezing. Last fill 7.28.13

## 2011-12-08 NOTE — Telephone Encounter (Signed)
Refill done.  

## 2011-12-29 ENCOUNTER — Telehealth: Payer: Self-pay | Admitting: Family Medicine

## 2011-12-29 MED ORDER — FENOFIBRATE 160 MG PO TABS: 160.0000 mg | ORAL_TABLET | Freq: Every day | ORAL | Status: DC

## 2011-12-29 NOTE — Telephone Encounter (Signed)
rx sent to pharmacy by e-script  

## 2011-12-29 NOTE — Telephone Encounter (Signed)
Refill: fenofibrate 160 mg tablets. Take 1 tablet by mouth every day. Qty 30. Last fill 9.12.13

## 2012-01-20 ENCOUNTER — Telehealth: Payer: Self-pay | Admitting: Family Medicine

## 2012-01-20 MED ORDER — LEVOTHYROXINE SODIUM 88 MCG PO TABS: 88.0000 ug | ORAL_TABLET | Freq: Every day | ORAL | Status: DC

## 2012-01-20 NOTE — Telephone Encounter (Signed)
Rx sent 

## 2012-01-20 NOTE — Telephone Encounter (Signed)
Refill: Levothyroxine 0.088 mg tab. Take 1 tablet by mouth every day. Qty 30. Last fill 10.6.13

## 2012-02-02 ENCOUNTER — Encounter: Payer: Self-pay | Admitting: Family Medicine

## 2012-02-02 ENCOUNTER — Ambulatory Visit (INDEPENDENT_AMBULATORY_CARE_PROVIDER_SITE_OTHER): Payer: Medicare Other | Admitting: Family Medicine

## 2012-02-02 VITALS — BP 98/56 | HR 48 | Temp 98.1°F | Wt 156.0 lb

## 2012-02-02 DIAGNOSIS — J4489 Other specified chronic obstructive pulmonary disease: Secondary | ICD-10-CM

## 2012-02-02 DIAGNOSIS — J449 Chronic obstructive pulmonary disease, unspecified: Secondary | ICD-10-CM

## 2012-02-02 DIAGNOSIS — E039 Hypothyroidism, unspecified: Secondary | ICD-10-CM

## 2012-02-02 DIAGNOSIS — Z23 Encounter for immunization: Secondary | ICD-10-CM

## 2012-02-02 DIAGNOSIS — F3289 Other specified depressive episodes: Secondary | ICD-10-CM

## 2012-02-02 DIAGNOSIS — E785 Hyperlipidemia, unspecified: Secondary | ICD-10-CM

## 2012-02-02 DIAGNOSIS — F329 Major depressive disorder, single episode, unspecified: Secondary | ICD-10-CM

## 2012-02-02 LAB — HEPATIC FUNCTION PANEL
Albumin: 4.2 g/dL (ref 3.5–5.2)
Total Protein: 7.3 g/dL (ref 6.0–8.3)

## 2012-02-02 LAB — LIPID PANEL
Cholesterol: 192 mg/dL (ref 0–200)
Total CHOL/HDL Ratio: 3
Triglycerides: 87 mg/dL (ref 0.0–149.0)

## 2012-02-02 LAB — TSH: TSH: 0.28 u[IU]/mL — ABNORMAL LOW (ref 0.35–5.50)

## 2012-02-02 MED ORDER — CITALOPRAM HYDROBROMIDE 20 MG PO TABS: 20.0000 mg | ORAL_TABLET | Freq: Every day | ORAL | Status: DC

## 2012-02-02 MED ORDER — TIOTROPIUM BROMIDE MONOHYDRATE 18 MCG IN CAPS: 18.0000 ug | ORAL_CAPSULE | Freq: Every day | RESPIRATORY_TRACT | Status: DC

## 2012-02-02 NOTE — Assessment & Plan Note (Signed)
Unchanged.  Refill provided on celexa.

## 2012-02-02 NOTE — Assessment & Plan Note (Signed)
Chronic problem.  Asymptomatic.  Check labs.  Adjust meds prn  

## 2012-02-02 NOTE — Assessment & Plan Note (Signed)
Refill given on Spiriva.  Applauded pt's recent efforts on smoking cessation.  Will continue to follow.

## 2012-02-02 NOTE — Assessment & Plan Note (Signed)
Chronic problem.  Tolerating meds w/out difficulty.  Check labs.  Adjust meds prn  

## 2012-02-02 NOTE — Progress Notes (Signed)
  Subjective:    Patient ID: Amy Bates, female    DOB: 11-15-1945, 66 y.o.   MRN: [id number]  HPI Hyperlipidemia- chronic problem, on fenofibrate and Zocor.  Due for labs.  Denies abd pain, N/V, myalgais  Hypothyroid- chronic problem, on synthroid daily.  Denies excessive fatigue, changes to skin/hair/nails.  Some cold intolerance.  COPD- pt reports breathing has improved since she has cut back on smoking.  Previously smoked 1ppd, it has now taken her 1 month to smoke a pack.   Review of Systems For ROS see HPI     Objective:   Physical Exam  Vitals reviewed. Constitutional: She is oriented to person, place, and time. She appears well-developed and well-nourished. No distress.  HENT:  Head: Normocephalic and atraumatic.  Eyes: Conjunctivae normal and EOM are normal. Pupils are equal, round, and reactive to light.  Neck: Normal range of motion. Neck supple. No thyromegaly present.  Cardiovascular: Normal rate, regular rhythm, normal heart sounds and intact distal pulses.   No murmur heard. Pulmonary/Chest: Effort normal and breath sounds normal. No respiratory distress.  Abdominal: Soft. She exhibits no distension. There is no tenderness.  Musculoskeletal: She exhibits no edema.  Lymphadenopathy:    She has no cervical adenopathy.  Neurological: She is alert and oriented to person, place, and time.  Skin: Skin is warm and dry.  Psychiatric: She has a normal mood and affect. Her behavior is normal.          Assessment & Plan:

## 2012-02-02 NOTE — Patient Instructions (Addendum)
Schedule your complete physical in 6 months We'll notify you of your lab results and make any changes if needed So glad you cut back on smoking! Call with any questions or concerns Happy Holidays!!!

## 2012-02-03 ENCOUNTER — Other Ambulatory Visit: Payer: Self-pay | Admitting: *Deleted

## 2012-02-03 MED ORDER — LEVOTHYROXINE SODIUM 75 MCG PO TABS: 75.0000 ug | ORAL_TABLET | Freq: Every day | ORAL | Status: DC

## 2012-02-24 ENCOUNTER — Telehealth: Payer: Self-pay | Admitting: Family Medicine

## 2012-02-24 NOTE — Telephone Encounter (Signed)
Refill: Simvastatin 40 mg tablets. Take 1 tablet by mouth at bedtime. Qty 30. Last fill 11-27-11

## 2012-02-26 ENCOUNTER — Other Ambulatory Visit: Payer: Self-pay | Admitting: *Deleted

## 2012-02-26 ENCOUNTER — Other Ambulatory Visit: Payer: Self-pay | Admitting: Family Medicine

## 2012-02-26 DIAGNOSIS — E785 Hyperlipidemia, unspecified: Secondary | ICD-10-CM

## 2012-02-26 MED ORDER — SIMVASTATIN 40 MG PO TABS: 40.0000 mg | ORAL_TABLET | Freq: Every day | ORAL | Status: DC

## 2012-02-26 NOTE — Telephone Encounter (Signed)
RX faxed//AB/CMA

## 2012-03-02 ENCOUNTER — Telehealth: Payer: Self-pay | Admitting: Family Medicine

## 2012-03-02 MED ORDER — ALBUTEROL SULFATE HFA 108 (90 BASE) MCG/ACT IN AERS: 2.0000 | INHALATION_SPRAY | Freq: Four times a day (QID) | RESPIRATORY_TRACT | Status: DC | PRN

## 2012-03-02 NOTE — Telephone Encounter (Signed)
Refill: Proair inh (200 puffs) 8.5gm dos ctr. Inhale 2 puffs into the lungs every 6 hours as needed for wheezing. Last fill 01-20-12

## 2012-03-02 NOTE — Telephone Encounter (Signed)
Rx sent to pharmacy by e-script.//AB/CMA 

## 2012-05-03 ENCOUNTER — Telehealth: Payer: Self-pay | Admitting: Family Medicine

## 2012-05-03 NOTE — Telephone Encounter (Signed)
Also add  Levothyroxine (Tab) 75 MCG Take 1 tablet (75 mcg total) by mouth daily #30 last fill 1.17.14

## 2012-05-03 NOTE — Telephone Encounter (Signed)
refill Fenofibrate 106mg  tablets #30 take one tablet by mouth every day last fill 11.13.13

## 2012-05-04 MED ORDER — FENOFIBRATE 160 MG PO TABS: 160.0000 mg | ORAL_TABLET | Freq: Every day | ORAL | Status: DC

## 2012-05-04 MED ORDER — LEVOTHYROXINE SODIUM 75 MCG PO TABS: 75.0000 ug | ORAL_TABLET | Freq: Every day | ORAL | Status: DC

## 2012-05-04 NOTE — Telephone Encounter (Signed)
Rx sent to the pharmacy([facility] [address]) by e-script.//AB/CMA

## 2012-05-11 ENCOUNTER — Telehealth: Payer: Self-pay | Admitting: Family Medicine

## 2012-05-11 MED ORDER — ALBUTEROL SULFATE HFA 108 (90 BASE) MCG/ACT IN AERS: 2.0000 | INHALATION_SPRAY | Freq: Four times a day (QID) | RESPIRATORY_TRACT | Status: DC | PRN

## 2012-05-11 NOTE — Telephone Encounter (Signed)
refill Proair INH (200 Puffs) 8.5GM DOS CTR, 8.5, Inhale 2 puffs every 6 hours as needed for wheexing last fill 1.17.14

## 2012-05-11 NOTE — Telephone Encounter (Signed)
Rx sent to the pharmacy([facility] [address] by e-script.//AB/CMA

## 2012-06-01 ENCOUNTER — Ambulatory Visit (INDEPENDENT_AMBULATORY_CARE_PROVIDER_SITE_OTHER): Payer: Medicare Other | Admitting: Family Medicine

## 2012-06-01 ENCOUNTER — Encounter: Payer: Self-pay | Admitting: Family Medicine

## 2012-06-01 ENCOUNTER — Ambulatory Visit (HOSPITAL_BASED_OUTPATIENT_CLINIC_OR_DEPARTMENT_OTHER)
Admission: RE | Admit: 2012-06-01 | Discharge: 2012-06-01 | Disposition: A | Discharge status: Home / Self Care | Payer: Medicare Other | Source: Ambulatory Visit | Attending: Family Medicine | Admitting: Family Medicine

## 2012-06-01 VITALS — BP 100/60 | HR 80 | Temp 98.2°F | Ht 67.0 in | Wt 159.4 lb

## 2012-06-01 DIAGNOSIS — R059 Cough, unspecified: Secondary | ICD-10-CM | POA: Insufficient documentation

## 2012-06-01 DIAGNOSIS — J441 Chronic obstructive pulmonary disease with (acute) exacerbation: Secondary | ICD-10-CM

## 2012-06-01 DIAGNOSIS — J438 Other emphysema: Secondary | ICD-10-CM | POA: Insufficient documentation

## 2012-06-01 DIAGNOSIS — R05 Cough: Secondary | ICD-10-CM | POA: Insufficient documentation

## 2012-06-01 DIAGNOSIS — J3489 Other specified disorders of nose and nasal sinuses: Secondary | ICD-10-CM | POA: Insufficient documentation

## 2012-06-01 MED ORDER — CLARITHROMYCIN ER 500 MG PO TB24: 1000.0000 mg | ORAL_TABLET | Freq: Every day | ORAL | Status: AC

## 2012-06-01 MED ORDER — PROMETHAZINE-DM 6.25-15 MG/5ML PO SYRP: 5.0000 mL | ORAL_SOLUTION | Freq: Four times a day (QID) | ORAL | Status: DC | PRN

## 2012-06-01 MED ORDER — MOMETASONE FUROATE 220 MCG/INH IN AEPB: 2.0000 | INHALATION_SPRAY | Freq: Every day | RESPIRATORY_TRACT | Status: DC

## 2012-06-01 NOTE — Patient Instructions (Signed)
Go get your chest xray Start the Biaxin- 2 tabs at the same time daily- take w/ food Use the cough syrup as needed- will cause drowsiness Continue the mucus relief Restart the Asmanex Call with any questions or concerns Hang in there!!!

## 2012-06-01 NOTE — Assessment & Plan Note (Signed)
New.  Pt not currently on daily ICS.  Using Spiriva daily.  Due to duration of sxs, nature of cough, and underlying COPD will start abx.  Refill Asmanex- sample given.  Get CXR.  Reviewed supportive care and red flags that should prompt return.  Pt expressed understanding and is in agreement w/ plan.

## 2012-06-01 NOTE — Progress Notes (Signed)
  Subjective:    Patient ID: Amy Bates, female    DOB: [birth year], 67 y.o.   MRN: [id number]  HPI URI- sxs started ~10 days ago when she lost power in the house.  + nasal congestion, 'severe cough'- productive.  No sinus pain/pressure.  No fevers.  + hoarseness.  No ear pain.  Hx of COPD and tobacco use.  Using spiriva regularly.  Not using Asmanex.  Using Proair more often recently.   Review of Systems For ROS see HPI     Objective:   Physical Exam  Vitals reviewed. Constitutional: She appears well-developed and well-nourished. No distress.  HENT:  Head: Normocephalic and atraumatic.  TMs normal bilaterally Mild nasal congestion Throat w/out erythema, edema, or exudate  Eyes: Conjunctivae and EOM are normal. Pupils are equal, round, and reactive to light.  Neck: Normal range of motion. Neck supple.  Cardiovascular: Normal rate, regular rhythm, normal heart sounds and intact distal pulses.   No murmur heard. Pulmonary/Chest: Effort normal. No respiratory distress. She has no wheezes.  + hacking cough Prolonged expiratory phase Crackles at LLL  Lymphadenopathy:    She has no cervical adenopathy.          Assessment & Plan:

## 2012-07-29 ENCOUNTER — Encounter: Payer: Self-pay | Admitting: Family Medicine

## 2012-07-29 ENCOUNTER — Ambulatory Visit (INDEPENDENT_AMBULATORY_CARE_PROVIDER_SITE_OTHER): Payer: Medicare Other | Admitting: Family Medicine

## 2012-07-29 VITALS — BP 86/56 | Temp 98.0°F | Ht 67.0 in | Wt 153.0 lb

## 2012-07-29 DIAGNOSIS — E039 Hypothyroidism, unspecified: Secondary | ICD-10-CM

## 2012-07-29 DIAGNOSIS — Z0000 Encounter for general adult medical examination without abnormal findings: Secondary | ICD-10-CM

## 2012-07-29 DIAGNOSIS — R634 Abnormal weight loss: Secondary | ICD-10-CM

## 2012-07-29 DIAGNOSIS — E785 Hyperlipidemia, unspecified: Secondary | ICD-10-CM

## 2012-07-29 LAB — CBC WITH DIFFERENTIAL/PLATELET
Basophils Absolute: 0 10*3/uL (ref 0.0–0.1)
HCT: 42.5 % (ref 36.0–46.0)
Hemoglobin: 14.6 g/dL (ref 12.0–15.0)
Lymphs Abs: 2.5 10*3/uL (ref 0.7–4.0)
MCV: 91.9 fl (ref 78.0–100.0)
Monocytes Absolute: 0.5 10*3/uL (ref 0.1–1.0)
Neutro Abs: 3.3 10*3/uL (ref 1.4–7.7)
Platelets: 205 10*3/uL (ref 150.0–400.0)
RDW: 13.1 % (ref 11.5–14.6)

## 2012-07-29 LAB — HEPATIC FUNCTION PANEL
Albumin: 4.1 g/dL (ref 3.5–5.2)
Alkaline Phosphatase: 43 U/L (ref 39–117)
Total Protein: 7 g/dL (ref 6.0–8.3)

## 2012-07-29 LAB — BASIC METABOLIC PANEL
Calcium: 9.4 mg/dL (ref 8.4–10.5)
Creatinine, Ser: 1 mg/dL (ref 0.4–1.2)
GFR: 60.34 mL/min (ref >60.00)
Glucose, Bld: 81 mg/dL (ref 70–99)
Sodium: 142 mEq/L (ref 135–145)

## 2012-07-29 LAB — LIPID PANEL
Cholesterol: 160 mg/dL (ref 0–200)
HDL: 50.3 mg/dL (ref >39.00)
Total CHOL/HDL Ratio: 3
Triglycerides: 101 mg/dL (ref 0.0–149.0)

## 2012-07-29 NOTE — Patient Instructions (Addendum)
Follow up in 6 months to recheck cholesterol We'll notify you of your lab results and make any changes if needed Complete the stool cards and return them as directed Cut back on the smoking! Call with any questions or concerns Happy Bel[employer] Birthday!

## 2012-07-29 NOTE — Progress Notes (Signed)
  Subjective:    Patient ID: Amy Bates, female    DOB: Apr 12, 1945, 67 y.o.   MRN: [id number]  HPI Here today for CPE.  Risk Factors: Hyperlipidemia- chronic problem, on Zocor daily.  Denies abd pain, N/V, myalgias Hypothyroid- chronic problem, on Synthroid daily.  Denies fatigue Weight loss- pt has lost 2 lbs in the last 6 months, pt is pleased that she hasn't lost more than that.  Reports she is not concerned at this time since things seem stable. 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 Labs Ordered: See A&P Care Plan: See A&P    Review of Systems Patient reports no vision/ hearing changes, adenopathy,fever, weight change,  persistant/recurrent hoarseness , swallowing issues, chest pain, palpitations, edema, persistant/recurrent cough, hemoptysis, dyspnea (rest/exertional/paroxysmal nocturnal) above baseline, gastrointestinal bleeding (melena, rectal bleeding), abdominal pain, significant heartburn, bowel changes, GU symptoms (dysuria, hematuria, incontinence), Gyn symptoms (abnormal  bleeding, pain),  syncope, focal weakness, memory loss, numbness & tingling, skin/hair/nail changes, abnormal bruising or bleeding.     Objective:   Physical Exam  General Appearance:    Alert, cooperative, no distress, appears stated age  Head:    Normocephalic, without obvious abnormality, atraumatic  Eyes:    PERRL, conjunctiva/corneas clear, EOM's intact, fundi    benign, both eyes  Ears:    Normal TM's and external ear canals,  both ears  Nose:   Nares normal, septum midline, mucosa normal, no drainage    or sinus tenderness  Throat:   Lips, mucosa, and tongue normal; teeth and gums normal  Neck:   Supple, symmetrical, trachea midline, no adenopathy;    Thyroid: no enlargement/tenderness/nodules  Back:     Symmetric, no curvature, ROM normal, no CVA tenderness  Lungs:     Clear to auscultation bilaterally, respirations unlabored  Chest Wall:    No tenderness or deformity   Heart:    Regular rate and rhythm, S1 and S2 normal, no murmur, rub   or gallop  Breast Exam:    No tenderness, masses, or nipple abnormality  Abdomen:     Soft, non-tender, bowel sounds active all four quadrants,    no masses, no organomegaly  Genitalia:    Deferred at pt's request  Rectal:    Extremities:   Extremities normal, atraumatic, no cyanosis or edema  Pulses:   2+ and symmetric all extremities  Skin:   Skin color, texture, turgor normal, no rashes or lesions  Lymph nodes:   Cervical, supraclavicular, and axillary nodes normal  Neurologic:   CNII-XII intact, normal strength, sensation and reflexes    throughout          Assessment & Plan:

## 2012-07-30 ENCOUNTER — Encounter: Payer: Self-pay | Admitting: General Practice

## 2012-08-01 ENCOUNTER — Other Ambulatory Visit: Payer: Self-pay | Admitting: Family Medicine

## 2012-08-02 LAB — VITAMIN D 1,25 DIHYDROXY: Vitamin D2 1, 25 (OH)2: <8 pg/mL

## 2012-08-02 NOTE — Telephone Encounter (Signed)
Rx sent to the pharmacy by e-script.//AB/CMA 

## 2012-08-03 NOTE — Assessment & Plan Note (Signed)
Chronic problem.  Tolerating statin w/out difficulty.  Check labs.  Adjust meds prn  

## 2012-08-03 NOTE — Assessment & Plan Note (Signed)
Pt's PE WNL.  Pt refusing colonoscopy, mammo, DEXA due to cost.  No longer needs paps due to age.  Check labs.  Anticipatory guidance provided.

## 2012-08-03 NOTE — Assessment & Plan Note (Signed)
Chronic problem, currently asymptomatic.  Check labs.  Adjust meds prn  

## 2012-08-03 NOTE — Assessment & Plan Note (Signed)
Fairly stable.  Will continue to follow.

## 2012-08-06 ENCOUNTER — Other Ambulatory Visit (INDEPENDENT_AMBULATORY_CARE_PROVIDER_SITE_OTHER): Payer: Medicare Other

## 2012-08-06 DIAGNOSIS — Z0000 Encounter for general adult medical examination without abnormal findings: Secondary | ICD-10-CM

## 2012-08-31 ENCOUNTER — Other Ambulatory Visit: Payer: Self-pay | Admitting: Family Medicine

## 2012-09-01 NOTE — Telephone Encounter (Signed)
Rx sent to the pharmacy by e-script.//AB/CMA 

## 2012-09-13 ENCOUNTER — Other Ambulatory Visit: Payer: Self-pay | Admitting: Family Medicine

## 2012-09-14 NOTE — Telephone Encounter (Signed)
Refill done.  

## 2012-10-07 ENCOUNTER — Other Ambulatory Visit: Payer: Self-pay | Admitting: Family Medicine

## 2012-11-02 ENCOUNTER — Other Ambulatory Visit: Payer: Self-pay | Admitting: Family Medicine

## 2012-11-03 NOTE — Telephone Encounter (Signed)
Med filled.  

## 2013-01-10 ENCOUNTER — Encounter: Payer: Self-pay | Admitting: General Practice

## 2013-01-10 ENCOUNTER — Ambulatory Visit (INDEPENDENT_AMBULATORY_CARE_PROVIDER_SITE_OTHER): Payer: Medicare Other | Admitting: Family Medicine

## 2013-01-10 ENCOUNTER — Encounter: Payer: Self-pay | Admitting: Family Medicine

## 2013-01-10 VITALS — BP 124/80 | HR 65 | Temp 98.0°F | Resp 16 | Wt 146.2 lb

## 2013-01-10 DIAGNOSIS — R413 Other amnesia: Secondary | ICD-10-CM | POA: Insufficient documentation

## 2013-01-10 DIAGNOSIS — R410 Disorientation, unspecified: Secondary | ICD-10-CM

## 2013-01-10 DIAGNOSIS — R109 Unspecified abdominal pain: Secondary | ICD-10-CM

## 2013-01-10 DIAGNOSIS — R1084 Generalized abdominal pain: Secondary | ICD-10-CM

## 2013-01-10 LAB — HEPATIC FUNCTION PANEL
AST: 22 U/L (ref 0–37)
Albumin: 4.1 g/dL (ref 3.5–5.2)
Alkaline Phosphatase: 47 U/L (ref 39–117)
Bilirubin, Direct: 0.1 mg/dL (ref 0.0–0.3)
Total Protein: 7.6 g/dL (ref 6.0–8.3)

## 2013-01-10 LAB — CBC WITH DIFFERENTIAL/PLATELET
Basophils Absolute: 0 10*3/uL (ref 0.0–0.1)
Eosinophils Absolute: 0.1 10*3/uL (ref 0.0–0.7)
HCT: 41.8 % (ref 36.0–46.0)
Hemoglobin: 14.3 g/dL (ref 12.0–15.0)
Lymphocytes Relative: 37.3 % (ref 12.0–46.0)
Lymphs Abs: 2.4 10*3/uL (ref 0.7–4.0)
MCHC: 34.2 g/dL (ref 30.0–36.0)
Neutro Abs: 3.4 10*3/uL (ref 1.4–7.7)
Platelets: 251 10*3/uL (ref 150.0–400.0)
RDW: 12.5 % (ref 11.5–14.6)

## 2013-01-10 LAB — TSH: TSH: 1.95 u[IU]/mL (ref 0.35–5.50)

## 2013-01-10 LAB — BASIC METABOLIC PANEL
Calcium: 9.5 mg/dL (ref 8.4–10.5)
Creatinine, Ser: 0.8 mg/dL (ref 0.4–1.2)

## 2013-01-10 MED ORDER — POLYETHYLENE GLYCOL 3350 17 GM/SCOOP PO POWD: 17.0000 g | Freq: Two times a day (BID) | ORAL | Status: DC | PRN

## 2013-01-10 NOTE — Patient Instructions (Signed)
Follow up as needed/scheduled We'll notify you of your lab results and make any changes if needed Someone will call you with your CT scan Start the Miralax twice daily until having regular BMs and then decrease to once daily If you have additional pain, your symptoms change, or other concerns- please call! Hang in there!!

## 2013-01-10 NOTE — Progress Notes (Signed)
  Subjective:    Patient ID: Amy Bates, female    DOB: [birth year], 67 y.o.   MRN: [id number]  HPI abd pain-  3 weeks.  2 weeks w/out BM.  Had BM on Friday w/out relief of pain.  Pain is severe w/ eating.  Pt will sit on toilet for 10-15 minutes w/out results.  Pain is centralized and will 'at times, radiate to back'.  No vomiting.  No blood in stool.  Denies worsening GERD.    'low oxygen'- pt w/ hx of COPD.  On Spiriva, Asmanex, and albuterol as needed.  Pt reports she will have episodes where she forgets what she's saying mid-sentence and husband has to finish her thoughts.  'i'm getting confused'.  Still smoking.  'some days are good days.  Some aren't'.  Will have confusion 3-4x/week.  Reading comprehension and following instructions are not impacted.   Review of Systems For ROS see HPI     Objective:   Physical Exam  Vitals reviewed. Constitutional: She is oriented to person, place, and time. She appears well-developed and well-nourished. No distress.  Smells of smoke  HENT:  Head: Normocephalic and atraumatic.  Neck: Neck supple. No thyromegaly present.  Cardiovascular: Normal rate, regular rhythm, normal heart sounds and intact distal pulses.   Pulmonary/Chest: Effort normal and breath sounds normal. No respiratory distress. She has no wheezes. She has no rales.  Abdominal: Bowel sounds are normal. She exhibits distension. She exhibits no mass. There is tenderness (over lower quadrants bilaterally, L>R). There is no rebound and no guarding.  Musculoskeletal: She exhibits no edema.  Lymphadenopathy:    She has no cervical adenopathy.  Neurological: She is alert and oriented to person, place, and time. No cranial nerve deficit. Coordination normal.  Skin: Skin is warm and dry. There is pallor.  Psychiatric: She has a normal mood and affect. Her behavior is normal. Judgment and thought content normal.          Assessment & Plan:

## 2013-01-11 ENCOUNTER — Encounter (HOSPITAL_BASED_OUTPATIENT_CLINIC_OR_DEPARTMENT_OTHER): Payer: Self-pay

## 2013-01-11 ENCOUNTER — Encounter (HOSPITAL_COMMUNITY): Payer: Medicare Other | Admitting: Anesthesiology

## 2013-01-11 ENCOUNTER — Encounter (HOSPITAL_COMMUNITY): Payer: Self-pay | Admitting: Emergency Medicine

## 2013-01-11 ENCOUNTER — Telehealth: Payer: Self-pay | Admitting: Family Medicine

## 2013-01-11 ENCOUNTER — Emergency Department (HOSPITAL_COMMUNITY): Payer: Medicare Other

## 2013-01-11 ENCOUNTER — Other Ambulatory Visit: Payer: Self-pay

## 2013-01-11 ENCOUNTER — Ambulatory Visit (HOSPITAL_BASED_OUTPATIENT_CLINIC_OR_DEPARTMENT_OTHER)
Admission: RE | Admit: 2013-01-11 | Discharge: 2013-01-11 | Disposition: A | Discharge status: Home / Self Care | Payer: Medicare Other | Source: Ambulatory Visit | Attending: Family Medicine | Admitting: Family Medicine

## 2013-01-11 ENCOUNTER — Encounter (HOSPITAL_COMMUNITY)
Admission: EM | Disposition: A | Discharge status: Home / Self Care | Payer: Self-pay | Source: Home / Self Care | Attending: Surgery

## 2013-01-11 ENCOUNTER — Inpatient Hospital Stay (HOSPITAL_COMMUNITY)
Admission: EM | Admit: 2013-01-11 | Discharge: 2013-01-13 | DRG: 238 | Disposition: A | Discharge status: Home / Self Care | Payer: Medicare Other | Attending: Surgery | Admitting: Surgery

## 2013-01-11 ENCOUNTER — Emergency Department (HOSPITAL_COMMUNITY): Payer: Medicare Other | Admitting: Anesthesiology

## 2013-01-11 ENCOUNTER — Inpatient Hospital Stay (HOSPITAL_COMMUNITY): Payer: Medicare Other

## 2013-01-11 DIAGNOSIS — K219 Gastro-esophageal reflux disease without esophagitis: Secondary | ICD-10-CM | POA: Diagnosis present

## 2013-01-11 DIAGNOSIS — I739 Peripheral vascular disease, unspecified: Secondary | ICD-10-CM | POA: Diagnosis present

## 2013-01-11 DIAGNOSIS — F3289 Other specified depressive episodes: Secondary | ICD-10-CM | POA: Diagnosis present

## 2013-01-11 DIAGNOSIS — I959 Hypotension, unspecified: Secondary | ICD-10-CM | POA: Diagnosis not present

## 2013-01-11 DIAGNOSIS — E785 Hyperlipidemia, unspecified: Secondary | ICD-10-CM | POA: Diagnosis present

## 2013-01-11 DIAGNOSIS — F329 Major depressive disorder, single episode, unspecified: Secondary | ICD-10-CM | POA: Diagnosis present

## 2013-01-11 DIAGNOSIS — I714 Abdominal aortic aneurysm, without rupture, unspecified: Secondary | ICD-10-CM

## 2013-01-11 DIAGNOSIS — R410 Disorientation, unspecified: Secondary | ICD-10-CM

## 2013-01-11 DIAGNOSIS — F17200 Nicotine dependence, unspecified, uncomplicated: Secondary | ICD-10-CM | POA: Diagnosis present

## 2013-01-11 DIAGNOSIS — E039 Hypothyroidism, unspecified: Secondary | ICD-10-CM | POA: Diagnosis present

## 2013-01-11 DIAGNOSIS — Z79899 Other long term (current) drug therapy: Secondary | ICD-10-CM

## 2013-01-11 DIAGNOSIS — R6881 Early satiety: Secondary | ICD-10-CM | POA: Insufficient documentation

## 2013-01-11 DIAGNOSIS — J4489 Other specified chronic obstructive pulmonary disease: Secondary | ICD-10-CM | POA: Diagnosis present

## 2013-01-11 DIAGNOSIS — G4700 Insomnia, unspecified: Secondary | ICD-10-CM | POA: Diagnosis present

## 2013-01-11 DIAGNOSIS — R1084 Generalized abdominal pain: Secondary | ICD-10-CM

## 2013-01-11 DIAGNOSIS — J449 Chronic obstructive pulmonary disease, unspecified: Secondary | ICD-10-CM | POA: Diagnosis present

## 2013-01-11 DIAGNOSIS — R109 Unspecified abdominal pain: Secondary | ICD-10-CM | POA: Diagnosis present

## 2013-01-11 HISTORY — PX: ENDOVASCULAR STENT INSERTION: SHX5161

## 2013-01-11 LAB — CBC WITH DIFFERENTIAL/PLATELET
Eosinophils Absolute: 0.1 10*3/uL (ref 0.0–0.7)
Eosinophils Relative: 2 % (ref 0–5)
Hemoglobin: 14.2 g/dL (ref 12.0–15.0)
Lymphocytes Relative: 38 % (ref 12–46)
Lymphs Abs: 2.3 10*3/uL (ref 0.7–4.0)
MCHC: 35 g/dL (ref 30.0–36.0)
MCV: 89.6 fL (ref 78.0–100.0)
Monocytes Absolute: 0.5 10*3/uL (ref 0.1–1.0)
Monocytes Relative: 8 % (ref 3–12)
Neutrophils Relative %: 52 % (ref 43–77)
Platelets: 246 10*3/uL (ref 150–400)
RBC: 4.53 MIL/uL (ref 3.87–5.11)
WBC: 6.1 10*3/uL (ref 4.0–10.5)

## 2013-01-11 LAB — APTT: aPTT: 27 seconds (ref 24–37)

## 2013-01-11 LAB — BASIC METABOLIC PANEL
CO2: 24 mEq/L (ref 19–32)
Chloride: 106 mEq/L (ref 96–112)
GFR calc Af Amer: >90 mL/min (ref >90)
Potassium: 3.7 mEq/L (ref 3.5–5.1)

## 2013-01-11 LAB — CBC
HCT: 35.9 % — ABNORMAL LOW (ref 36.0–46.0)
Hemoglobin: 12.6 g/dL (ref 12.0–15.0)
MCH: 31.4 pg (ref 26.0–34.0)
Platelets: 197 10*3/uL (ref 150–400)
RBC: 4.01 MIL/uL (ref 3.87–5.11)
RDW: 12.6 % (ref 11.5–15.5)
WBC: 8 10*3/uL (ref 4.0–10.5)

## 2013-01-11 LAB — COMPREHENSIVE METABOLIC PANEL
ALT: 8 U/L (ref 0–35)
Alkaline Phosphatase: 58 U/L (ref 39–117)
CO2: 27 mEq/L (ref 19–32)
Creatinine, Ser: 0.81 mg/dL (ref 0.50–1.10)
GFR calc Af Amer: 86 mL/min — ABNORMAL LOW (ref >90)
GFR calc non Af Amer: 74 mL/min — ABNORMAL LOW (ref >90)
Glucose, Bld: 82 mg/dL (ref 70–99)
Potassium: 4.3 mEq/L (ref 3.5–5.1)
Sodium: 136 mEq/L (ref 135–145)
Total Bilirubin: 0.3 mg/dL (ref 0.3–1.2)

## 2013-01-11 LAB — MAGNESIUM: Magnesium: 1.7 mg/dL (ref 1.5–2.5)

## 2013-01-11 LAB — MRSA PCR SCREENING: MRSA by PCR: NEGATIVE

## 2013-01-11 LAB — PREPARE RBC (CROSSMATCH)

## 2013-01-11 LAB — PROTIME-INR: INR: 1.08 (ref 0.00–1.49)

## 2013-01-11 LAB — ABO/RH: ABO/RH(D): B POS

## 2013-01-11 SURGERY — ENDOVASCULAR STENT GRAFT INSERTION
Anesthesia: General | Wound class: Clean

## 2013-01-11 MED ORDER — METOPROLOL TARTRATE 1 MG/ML IV SOLN: 2.0000 mg | INTRAVENOUS | Status: DC | PRN

## 2013-01-11 MED ORDER — FLUTICASONE PROPIONATE HFA 44 MCG/ACT IN AERO
2.0000 | INHALATION_SPRAY | Freq: Two times a day (BID) | RESPIRATORY_TRACT | Status: DC
  Administered 2013-01-11 – 2013-01-13 (×4): 2 via RESPIRATORY_TRACT
  Filled 2013-01-11: qty 10.6

## 2013-01-11 MED ORDER — PHENYLEPHRINE HCL 10 MG/ML IJ SOLN
10.0000 mg | INTRAVENOUS | Status: DC | PRN
  Administered 2013-01-11: 15 ug/min via INTRAVENOUS

## 2013-01-11 MED ORDER — PROMETHAZINE-DM 6.25-15 MG/5ML PO SYRP
5.0000 mL | ORAL_SOLUTION | Freq: Four times a day (QID) | ORAL | Status: DC | PRN
  Filled 2013-01-11: qty 5

## 2013-01-11 MED ORDER — ALBUMIN HUMAN 5 % IV SOLN
INTRAVENOUS | Status: DC | PRN
  Administered 2013-01-11: 15:00:00 via INTRAVENOUS

## 2013-01-11 MED ORDER — PROTAMINE SULFATE 10 MG/ML IV SOLN
INTRAVENOUS | Status: DC | PRN
  Administered 2013-01-11 (×5): 10 mg via INTRAVENOUS

## 2013-01-11 MED ORDER — DOPAMINE-DEXTROSE 3.2-5 MG/ML-% IV SOLN
3.0000 ug/kg/min | INTRAVENOUS | Status: DC
  Administered 2013-01-11: 3 ug/kg/min via INTRAVENOUS

## 2013-01-11 MED ORDER — IODIXANOL 320 MG/ML IV SOLN
INTRAVENOUS | Status: DC | PRN
  Administered 2013-01-11: 200 mL via INTRA_ARTERIAL

## 2013-01-11 MED ORDER — CITALOPRAM HYDROBROMIDE 20 MG PO TABS
20.0000 mg | ORAL_TABLET | ORAL | Status: DC
  Administered 2013-01-12: 20 mg via ORAL
  Filled 2013-01-11: qty 1

## 2013-01-11 MED ORDER — GUAIFENESIN 400 MG PO TABS: 400.0000 mg | ORAL_TABLET | ORAL | Status: DC

## 2013-01-11 MED ORDER — HEPARIN SODIUM (PORCINE) 1000 UNIT/ML IJ SOLN
INTRAMUSCULAR | Status: DC | PRN
  Administered 2013-01-11: 2 mL via INTRAVENOUS
  Administered 2013-01-11: 6 mL via INTRAVENOUS

## 2013-01-11 MED ORDER — IOHEXOL 300 MG/ML  SOLN
100.0000 mL | Freq: Once | INTRAMUSCULAR | Status: AC | PRN
  Administered 2013-01-11: 100 mL via INTRAVENOUS

## 2013-01-11 MED ORDER — ONDANSETRON HCL 4 MG/2ML IJ SOLN
INTRAMUSCULAR | Status: DC | PRN
  Administered 2013-01-11: 4 mg via INTRAVENOUS

## 2013-01-11 MED ORDER — DEXTROSE 5 % IV SOLN
1.5000 g | INTRAVENOUS | Status: AC
  Administered 2013-01-11: 1.5 g via INTRAVENOUS

## 2013-01-11 MED ORDER — OXYCODONE HCL 5 MG PO TABS: 5.0000 mg | ORAL_TABLET | Freq: Once | ORAL | Status: DC | PRN

## 2013-01-11 MED ORDER — PANTOPRAZOLE SODIUM 40 MG PO TBEC
40.0000 mg | DELAYED_RELEASE_TABLET | Freq: Every day | ORAL | Status: DC
  Administered 2013-01-12: 40 mg via ORAL
  Filled 2013-01-11: qty 1

## 2013-01-11 MED ORDER — POTASSIUM CHLORIDE CRYS ER 20 MEQ PO TBCR: 20.0000 meq | EXTENDED_RELEASE_TABLET | Freq: Once | ORAL | Status: AC | PRN

## 2013-01-11 MED ORDER — PHENOL 1.4 % MT LIQD: 1.0000 | OROMUCOSAL | Status: DC | PRN

## 2013-01-11 MED ORDER — HYDROMORPHONE HCL PF 1 MG/ML IJ SOLN
0.2500 mg | INTRAMUSCULAR | Status: DC | PRN
  Administered 2013-01-11 (×2): 0.5 mg via INTRAVENOUS

## 2013-01-11 MED ORDER — ARTIFICIAL TEARS OP OINT
TOPICAL_OINTMENT | OPHTHALMIC | Status: DC | PRN
  Administered 2013-01-11: 1 via OPHTHALMIC

## 2013-01-11 MED ORDER — LACTATED RINGERS IV SOLN
INTRAVENOUS | Status: DC | PRN
  Administered 2013-01-11 (×2): via INTRAVENOUS

## 2013-01-11 MED ORDER — ONDANSETRON HCL 4 MG/2ML IJ SOLN: 4.0000 mg | Freq: Four times a day (QID) | INTRAMUSCULAR | Status: DC | PRN

## 2013-01-11 MED ORDER — LEVOTHYROXINE SODIUM 75 MCG PO TABS
75.0000 ug | ORAL_TABLET | Freq: Every day | ORAL | Status: DC
  Administered 2013-01-11 – 2013-01-13 (×2): 75 ug via ORAL
  Filled 2013-01-11 (×3): qty 1

## 2013-01-11 MED ORDER — PROMETHAZINE HCL 25 MG/ML IJ SOLN: 6.2500 mg | INTRAMUSCULAR | Status: DC | PRN

## 2013-01-11 MED ORDER — TIOTROPIUM BROMIDE MONOHYDRATE 18 MCG IN CAPS
18.0000 ug | ORAL_CAPSULE | Freq: Every day | RESPIRATORY_TRACT | Status: DC
  Administered 2013-01-12 – 2013-01-13 (×2): 18 ug via RESPIRATORY_TRACT
  Filled 2013-01-11: qty 5

## 2013-01-11 MED ORDER — LIDOCAINE HCL (CARDIAC) 20 MG/ML IV SOLN
INTRAVENOUS | Status: DC | PRN
  Administered 2013-01-11: 60 mg via INTRAVENOUS

## 2013-01-11 MED ORDER — GUAIFENESIN 200 MG PO TABS
400.0000 mg | ORAL_TABLET | ORAL | Status: DC | PRN
  Filled 2013-01-11: qty 2

## 2013-01-11 MED ORDER — ACETAMINOPHEN 325 MG PO TABS: 325.0000 mg | ORAL_TABLET | ORAL | Status: DC | PRN

## 2013-01-11 MED ORDER — HYDROMORPHONE HCL PF 1 MG/ML IJ SOLN
INTRAMUSCULAR | Status: AC
  Filled 2013-01-11: qty 1

## 2013-01-11 MED ORDER — ACETAMINOPHEN 650 MG RE SUPP: 325.0000 mg | RECTAL | Status: DC | PRN

## 2013-01-11 MED ORDER — FENTANYL CITRATE 0.05 MG/ML IJ SOLN
INTRAMUSCULAR | Status: DC | PRN
  Administered 2013-01-11 (×2): 50 ug via INTRAVENOUS
  Administered 2013-01-11: 100 ug via INTRAVENOUS

## 2013-01-11 MED ORDER — DEXTROSE 5 % IV SOLN
1.5000 g | Freq: Two times a day (BID) | INTRAVENOUS | Status: AC
  Administered 2013-01-12 (×2): 1.5 g via INTRAVENOUS
  Filled 2013-01-11 (×3): qty 1.5

## 2013-01-11 MED ORDER — MORPHINE SULFATE 2 MG/ML IJ SOLN: 2.0000 mg | INTRAMUSCULAR | Status: DC | PRN

## 2013-01-11 MED ORDER — OXYCODONE HCL 5 MG/5ML PO SOLN: 5.0000 mg | Freq: Once | ORAL | Status: DC | PRN

## 2013-01-11 MED ORDER — DOPAMINE-DEXTROSE 3.2-5 MG/ML-% IV SOLN
INTRAVENOUS | Status: AC
  Administered 2013-01-11: 800 mg
  Filled 2013-01-11: qty 250

## 2013-01-11 MED ORDER — CITALOPRAM HYDROBROMIDE 20 MG PO TABS: 20.0000 mg | ORAL_TABLET | Freq: Every day | ORAL | Status: DC

## 2013-01-11 MED ORDER — ZOLPIDEM TARTRATE 5 MG PO TABS: 5.0000 mg | ORAL_TABLET | Freq: Every evening | ORAL | Status: DC | PRN

## 2013-01-11 MED ORDER — FENOFIBRATE 160 MG PO TABS
160.0000 mg | ORAL_TABLET | Freq: Every day | ORAL | Status: DC
Start: 2013-01-11 — End: 2013-01-13
  Administered 2013-01-11 – 2013-01-12 (×2): 160 mg via ORAL
  Filled 2013-01-11 (×3): qty 1

## 2013-01-11 MED ORDER — PROPOFOL 10 MG/ML IV BOLUS
INTRAVENOUS | Status: DC | PRN
  Administered 2013-01-11: 100 mg via INTRAVENOUS

## 2013-01-11 MED ORDER — OXYCODONE HCL 5 MG PO TABS: 5.0000 mg | ORAL_TABLET | ORAL | Status: DC | PRN

## 2013-01-11 MED ORDER — ALBUTEROL SULFATE HFA 108 (90 BASE) MCG/ACT IN AERS: 2.0000 | INHALATION_SPRAY | Freq: Four times a day (QID) | RESPIRATORY_TRACT | Status: DC | PRN

## 2013-01-11 MED ORDER — ENOXAPARIN SODIUM 40 MG/0.4ML ~~LOC~~ SOLN
40.0000 mg | SUBCUTANEOUS | Status: DC
  Filled 2013-01-11 (×2): qty 0.4

## 2013-01-11 MED ORDER — LABETALOL HCL 5 MG/ML IV SOLN
10.0000 mg | INTRAVENOUS | Status: DC | PRN
  Filled 2013-01-11: qty 4

## 2013-01-11 MED ORDER — MIDAZOLAM HCL 5 MG/5ML IJ SOLN
INTRAMUSCULAR | Status: DC | PRN
  Administered 2013-01-11: 0.5 mg via INTRAVENOUS

## 2013-01-11 MED ORDER — SODIUM CHLORIDE 0.9 % IV SOLN
INTRAVENOUS | Status: DC
  Administered 2013-01-11 – 2013-01-12 (×2): via INTRAVENOUS

## 2013-01-11 MED ORDER — NEOSTIGMINE METHYLSULFATE 1 MG/ML IJ SOLN
INTRAMUSCULAR | Status: DC | PRN
  Administered 2013-01-11: 3 mg via INTRAVENOUS

## 2013-01-11 MED ORDER — GLYCOPYRROLATE 0.2 MG/ML IJ SOLN
INTRAMUSCULAR | Status: DC | PRN
  Administered 2013-01-11: 0.2 mg via INTRAVENOUS
  Administered 2013-01-11: 0.6 mg via INTRAVENOUS

## 2013-01-11 MED ORDER — LACTATED RINGERS IV SOLN
INTRAVENOUS | Status: DC | PRN
  Administered 2013-01-11: 14:00:00 via INTRAVENOUS

## 2013-01-11 MED ORDER — ROCURONIUM BROMIDE 100 MG/10ML IV SOLN
INTRAVENOUS | Status: DC | PRN
  Administered 2013-01-11: 10 mg via INTRAVENOUS
  Administered 2013-01-11: 50 mg via INTRAVENOUS

## 2013-01-11 MED ORDER — POLYETHYLENE GLYCOL 3350 17 GM/SCOOP PO POWD
17.0000 g | Freq: Two times a day (BID) | ORAL | Status: DC | PRN
  Filled 2013-01-11: qty 255

## 2013-01-11 MED ORDER — ALUM & MAG HYDROXIDE-SIMETH 200-200-20 MG/5ML PO SUSP: 15.0000 mL | ORAL | Status: DC | PRN

## 2013-01-11 MED ORDER — 0.9 % SODIUM CHLORIDE (POUR BTL) OPTIME
TOPICAL | Status: DC | PRN
  Administered 2013-01-11: 1000 mL

## 2013-01-11 MED ORDER — SODIUM CHLORIDE 0.9 % IV SOLN: 500.0000 mL | Freq: Once | INTRAVENOUS | Status: AC | PRN

## 2013-01-11 MED ORDER — DOCUSATE SODIUM 100 MG PO CAPS
100.0000 mg | ORAL_CAPSULE | Freq: Every day | ORAL | Status: DC
  Administered 2013-01-12: 100 mg via ORAL
  Filled 2013-01-11 (×2): qty 1

## 2013-01-11 MED ORDER — HYDRALAZINE HCL 20 MG/ML IJ SOLN: 10.0000 mg | INTRAMUSCULAR | Status: DC | PRN

## 2013-01-11 MED ORDER — SODIUM CHLORIDE 0.9 % IR SOLN
Status: DC | PRN
  Administered 2013-01-11: 14:00:00

## 2013-01-11 MED ORDER — SIMVASTATIN 40 MG PO TABS
40.0000 mg | ORAL_TABLET | Freq: Every day | ORAL | Status: DC
  Administered 2013-01-12: 40 mg via ORAL
  Filled 2013-01-11 (×2): qty 1

## 2013-01-11 MED ORDER — FLUTICASONE PROPIONATE 50 MCG/ACT NA SUSP: 2.0000 | NASAL | Status: DC | PRN

## 2013-01-11 SURGICAL SUPPLY — 92 items
BAG DECANTER FOR FLEXI CONT (MISCELLANEOUS) IMPLANT
BALLN CODA OCL 2-9.0-35-120-3 (BALLOONS)
BALLOON COD OCL 2-9.0-35-120-3 (BALLOONS) IMPLANT
BALLOON FOX SV 5.0X40 (BALLOONS) ×2 IMPLANT
BLADE SURG ROTATE 9660 (MISCELLANEOUS) ×2 IMPLANT
CANISTER SUCTION 2500CC (MISCELLANEOUS) ×2 IMPLANT
CATH BEACON 5 .035 65 VANSC3 (CATHETERS) ×2 IMPLANT
CLIP TI MEDIUM 24 (CLIP) IMPLANT
CLIP TI MEDIUM 6 (CLIP) ×2 IMPLANT
CLIP TI WIDE RED SMALL 24 (CLIP) IMPLANT
CLIP TI WIDE RED SMALL 6 (CLIP) ×2 IMPLANT
COVER MAYO STAND STRL (DRAPES) ×2 IMPLANT
COVER SURGICAL LIGHT HANDLE (MISCELLANEOUS) ×2 IMPLANT
DERMABOND ADVANCED (GAUZE/BANDAGES/DRESSINGS) ×1
DERMABOND ADVANCED .7 DNX12 (GAUZE/BANDAGES/DRESSINGS) ×1 IMPLANT
DEVICE CLOSURE PERCLS PRGLD 6F (VASCULAR PRODUCTS) ×4 IMPLANT
DEVICE TORQUE 50000 (MISCELLANEOUS) IMPLANT
DRAIN CHANNEL 10F 3/8 F FF (DRAIN) IMPLANT
DRAIN CHANNEL 10M FLAT 3/4 FLT (DRAIN) IMPLANT
DRAPE ORTHO SPLIT 77X108 STRL (DRAPES) ×1
DRAPE SURG ORHT 6 SPLT 77X108 (DRAPES) ×1 IMPLANT
DRAPE TABLE COVER HEAVY DUTY (DRAPES) ×2 IMPLANT
DRSG TEGADERM 4X4.75 (GAUZE/BANDAGES/DRESSINGS) ×2 IMPLANT
DRYSEAL FLEXSHEATH 12FR 33CM (SHEATH) ×1
DRYSEAL FLEXSHEATH 16FR 33CM (SHEATH) ×1
ELECT REM PT RETURN 9FT ADLT (ELECTROSURGICAL) ×4
ELECTRODE REM PT RTRN 9FT ADLT (ELECTROSURGICAL) ×2 IMPLANT
EVACUATOR 3/16  PVC DRAIN (DRAIN)
EVACUATOR 3/16 PVC DRAIN (DRAIN) IMPLANT
EVACUATOR SILICONE 100CC (DRAIN) IMPLANT
EXCLUDER TNK 23X14.5MMX12CM (Endovascular Graft) ×1 IMPLANT
EXCLUDER TRUNK 23X14.5MMX12CM (Endovascular Graft) ×2 IMPLANT
FLEXOR CHECK FLO INTRODUCER ×2 IMPLANT
GAUZE SPONGE 2X2 8PLY STRL LF (GAUZE/BANDAGES/DRESSINGS) ×1 IMPLANT
GAUZE SPONGE 4X4 16PLY XRAY LF (GAUZE/BANDAGES/DRESSINGS) ×2 IMPLANT
GLOVE BIOGEL PI IND STRL 7.0 (GLOVE) ×1 IMPLANT
GLOVE BIOGEL PI IND STRL 7.5 (GLOVE) ×1 IMPLANT
GLOVE BIOGEL PI INDICATOR 7.0 (GLOVE) ×1
GLOVE BIOGEL PI INDICATOR 7.5 (GLOVE) ×1
GLOVE SURG SS PI 7.0 STRL IVOR (GLOVE) ×2 IMPLANT
GLOVE SURG SS PI 7.5 STRL IVOR (GLOVE) ×2 IMPLANT
GOWN PREVENTION PLUS XXLARGE (GOWN DISPOSABLE) ×2 IMPLANT
GOWN STRL NON-REIN LRG LVL3 (GOWN DISPOSABLE) ×4 IMPLANT
GOWN STRL REIN XL XLG (GOWN DISPOSABLE) ×2 IMPLANT
GRAFT BALLN CATH 65CM (STENTS) ×1 IMPLANT
GRAFT EXCLUDER LEG 12X14 (Endovascular Graft) ×2 IMPLANT
GRAFT EXCLUDER LEG 16X10 (Endovascular Graft) ×2 IMPLANT
HEMOSTAT SNOW SURGICEL 2X4 (HEMOSTASIS) IMPLANT
KIT BASIN OR (CUSTOM PROCEDURE TRAY) ×2 IMPLANT
KIT ENCORE 26 ADVANTAGE (KITS) ×2 IMPLANT
KIT ROOM TURNOVER OR (KITS) ×2 IMPLANT
NEEDLE PERC 18GX7CM (NEEDLE) ×2 IMPLANT
NS IRRIG 1000ML POUR BTL (IV SOLUTION) ×2 IMPLANT
PACK AORTA (CUSTOM PROCEDURE TRAY) ×2 IMPLANT
PAD ARMBOARD 7.5X6 YLW CONV (MISCELLANEOUS) ×4 IMPLANT
PENCIL BUTTON HOLSTER BLD 10FT (ELECTRODE) IMPLANT
PERCLOSE PROGLIDE 6F (VASCULAR PRODUCTS) ×8
SHEATH AVANTI 11CM 5FR (MISCELLANEOUS) ×2 IMPLANT
SHEATH DRYSEAL FLEX 12FR 33CM (SHEATH) ×1 IMPLANT
SHEATH DRYSEAL FLEX 16FR 33CM (SHEATH) ×1 IMPLANT
SPONGE GAUZE 2X2 STER 10/PKG (GAUZE/BANDAGES/DRESSINGS) ×1
SPONGE GAUZE 4X4 12PLY (GAUZE/BANDAGES/DRESSINGS) ×2 IMPLANT
STAPLER VISISTAT 35W (STAPLE) IMPLANT
STENT GRAFT BALLN CATH 65CM (STENTS) ×1
STENT VIABAHN 5X50X120 (Permanent Stent) ×2 IMPLANT
STOCKINETTE IMPERVIOUS LG (DRAPES) ×2 IMPLANT
STOPCOCK MORSE 400PSI 3WAY (MISCELLANEOUS) ×4 IMPLANT
SUT ETHILON 3 0 PS 1 (SUTURE) IMPLANT
SUT PROLENE 5 0 C 1 24 (SUTURE) IMPLANT
SUT PROLENE 6 0 CC (SUTURE) ×2 IMPLANT
SUT PROLENE 7 0 BV1 MDA (SUTURE) ×2 IMPLANT
SUT VIC AB 2-0 CT1 27 (SUTURE)
SUT VIC AB 2-0 CT1 TAPERPNT 27 (SUTURE) IMPLANT
SUT VIC AB 3-0 SH 27 (SUTURE) ×2
SUT VIC AB 3-0 SH 27X BRD (SUTURE) ×2 IMPLANT
SUT VICRYL 4-0 PS2 18IN ABS (SUTURE) ×4 IMPLANT
SYR 20CC LL (SYRINGE) ×4 IMPLANT
SYR 30ML LL (SYRINGE) IMPLANT
SYR 5ML LL (SYRINGE) IMPLANT
SYR MEDRAD MARK V 150ML (SYRINGE) ×2 IMPLANT
SYRINGE 10CC LL (SYRINGE) ×6 IMPLANT
TOWEL OR 17X24 6PK STRL BLUE (TOWEL DISPOSABLE) ×6 IMPLANT
TOWEL OR 17X26 10 PK STRL BLUE (TOWEL DISPOSABLE) ×6 IMPLANT
TRAY FOLEY CATH 16FRSI W/METER (SET/KITS/TRAYS/PACK) ×2 IMPLANT
TUBING CIL FLEX 10 FLL-RA (TUBING) ×2 IMPLANT
TUBING HIGH PRESSURE 120CM (CONNECTOR) ×2 IMPLANT
WATER STERILE IRR 1000ML POUR (IV SOLUTION) IMPLANT
WIRE AMPLATZ SS-J .035X180CM (WIRE) ×4 IMPLANT
WIRE BENTSON .035X145CM (WIRE) ×6 IMPLANT
WIRE MICRO SET SILHO 5FR 7 (SHEATH) ×2 IMPLANT
WIRE SPARTACORE .014X300CM (WIRE) ×2 IMPLANT
viabahn (Stent) ×2 IMPLANT

## 2013-01-11 NOTE — Anesthesia Preprocedure Evaluation (Addendum)
Anesthesia Evaluation  Patient identified by MRN, date of birth, ID band Patient awake    Reviewed: Allergy & Precautions, H&P , NPO status , Patient's Chart, lab work & pertinent test results, reviewed documented beta blocker date and time   Airway Mallampati: II TM Distance: >3 FB Neck ROM: Full    Dental  (+) Teeth Intact and Poor Dentition   Pulmonary asthma , pneumonia -, resolved, COPD COPD inhaler,    Pulmonary exam normal       Cardiovascular + Peripheral Vascular Disease     Neuro/Psych Depression    GI/Hepatic GERD-  Medicated and Controlled,  Endo/Other  Hypothyroidism   Renal/GU      Musculoskeletal   Abdominal Normal abdominal exam  (+)   Peds  Hematology   Anesthesia Other Findings   Reproductive/Obstetrics                          Anesthesia Physical Anesthesia Plan  ASA: III  Anesthesia Plan: General   Post-op Pain Management:    Induction: Intravenous  Airway Management Planned: Oral ETT  Additional Equipment: Arterial line  Intra-op Plan:   Post-operative Plan: Possible Post-op intubation/ventilation  Informed Consent: I have reviewed the patients History and Physical, chart, labs and discussed the procedure including the risks, benefits and alternatives for the proposed anesthesia with the patient or authorized representative who has indicated his/her understanding and acceptance.   Dental advisory given  Plan Discussed with: CRNA, Anesthesiologist and Surgeon  Anesthesia Plan Comments:        Anesthesia Quick Evaluation

## 2013-01-11 NOTE — Transfer of Care (Signed)
Immediate Anesthesia Transfer of Care Note  Patient: Amy Bates  Procedure(s) Performed: Procedure(s): ENDOVASCULAR STENT GRAFT INSERTION; ULTRASOUND GUIDED, Renal Stent left renal artery , open left brachial artery closure. (N/A)  Patient Location: PACU  Anesthesia Type:General  Level of Consciousness: awake, alert  and oriented  Airway & Oxygen Therapy: Patient Spontanous Breathing and Patient connected to nasal cannula oxygen  Post-op Assessment: Report given to PACU RN and Post -op Vital signs reviewed and stable  Post vital signs: Reviewed and stable  Complications: No apparent anesthesia complications

## 2013-01-11 NOTE — Telephone Encounter (Signed)
Called pt and LMOVM to return call. To inform on the imaging results and appt with Dr. Durwin Nora at [facility] on 01/12/2013 @ 2:45pm  Address: [address] Phone: [phone]

## 2013-01-11 NOTE — ED Provider Notes (Signed)
CSN: [id number]     Arrival date & time 01/11/13  1207 History   First MD Initiated Contact with Patient 01/11/13 1226     Chief Complaint  Patient presents with  . AAA   (Consider location/radiation/quality/duration/timing/severity/associated sxs/prior Treatment) Patient is a 67 y.o. female presenting with abdominal pain. The history is provided by the patient.  Abdominal Pain Pain location:  Periumbilical Pain quality: sharp   Pain radiates to:  Does not radiate Pain severity:  Mild Onset quality:  Gradual Duration:  3 weeks Timing:  Intermittent Progression:  Waxing and waning Chronicity:  New Context comment:  At rest Relieved by:  Nothing Worsened by:  Eating Ineffective treatments:  None tried Associated symptoms: no chest pain, no cough, no diarrhea, no dysuria, no fatigue, no fever, no hematuria, no nausea, no shortness of breath and no vomiting     Past Medical History  Diagnosis Date  . Arthritis   . Allergic rhinitis   . Asthma   . Depression   . GERD (gastroesophageal reflux disease)   . Hyperlipidemia   . Urinary incontinence   . History of chicken pox   . COPD (chronic obstructive pulmonary disease)   . Pneumonia   . Chest pain     [facility]  . Hypothyroid    Past Surgical History  Procedure Laterality Date  . Tubal ligation    . Tonsillectomy    . Knee surgery      right   Family History  Problem Relation Age of Onset  . Coronary artery disease Mother   . Stroke Mother   . Hypertension Neg Hx   . Diabetes Neg Hx   . Cancer Neg Hx    History  Substance Use Topics  . Smoking status: Current Every Day Smoker  . Smokeless tobacco: Not on file  . Alcohol Use: Yes     Comment: occ.   OB History   Grav Para Term Preterm Abortions TAB SAB Ect Mult Living   3 3             Review of Systems  Constitutional: Negative for fever and fatigue.  HENT: Negative for congestion and drooling.   Eyes: Negative for pain.  Respiratory:  Negative for cough and shortness of breath.   Cardiovascular: Negative for chest pain.  Gastrointestinal: Positive for abdominal pain. Negative for nausea, vomiting and diarrhea.  Genitourinary: Negative for dysuria and hematuria.  Musculoskeletal: Negative for back pain, gait problem and neck pain.  Skin: Negative for color change.  Neurological: Negative for dizziness and headaches.  Hematological: Negative for adenopathy.  Psychiatric/Behavioral: Negative for behavioral problems.  All other systems reviewed and are negative.    Allergies  Review of patient's allergies indicates no known allergies.  Home Medications   Current Outpatient Rx  Name  Route  Sig  Dispense  Refill  . citalopram (CELEXA) 20 MG tablet      TAKE 1 TABLET BY MOUTH EVERY DAY   30 tablet   3   . fenofibrate 160 MG tablet      TAKE 1 TABLET BY MOUTH EVERY DAY   30 tablet   3   . fluticasone (FLONASE) 50 MCG/ACT nasal spray   Nasal   2 sprays by Nasal route as needed.           Marland Kitchen guaifenesin (MUCUS RELIEF) 400 MG TABS   Oral   Take 400 mg by mouth every 4 (four) hours.           Marland Kitchen  levothyroxine (SYNTHROID, LEVOTHROID) 75 MCG tablet      TAKE 1 TABLET BY MOUTH DAILY   30 tablet   3   . mometasone (ASMANEX 120 METERED DOSES) 220 MCG/INH inhaler   Inhalation   Inhale 2 puffs into the lungs at bedtime.   1 Inhaler   6   . omeprazole (PRILOSEC) 20 MG capsule      TAKE 1 CAPSULE BY MOUTH EVERY DAY   90 capsule   1   . Polyethylene Glycol 400 0.25 % SOLN   Ophthalmic   Apply 2 drops to eye as needed.         . polyethylene glycol powder (GLYCOLAX/MIRALAX) powder   Oral   Take 17 g by mouth 2 (two) times daily as needed.   3350 g   1   . PROAIR HFA 108 (90 BASE) MCG/ACT inhaler      INHALE 2 PUFFS BY MOUTH EVERY 6 HOURS AS NEEDED FOR WHEEZING   8.5 g   2   . promethazine-dextromethorphan (PROMETHAZINE-DM) 6.25-15 MG/5ML syrup   Oral   Take 5 mLs by mouth 4 (four) times  daily as needed for cough.   240 mL   0   . simvastatin (ZOCOR) 40 MG tablet      TAKE ONE TABLET BY MOUTH EVERY NIGHT AT BEDTIME   30 tablet   6   . SPIRIVA HANDIHALER 18 MCG inhalation capsule      INHALE 1 CAPSULE VIA HANDIHALER ONCE DAILY   30 capsule   5    BP 128/59  Pulse 85  Temp(Src) 98.1 F (36.7 C) (Oral)  Resp 16  Wt 144 lb 1 oz (65.346 kg)  BMI 22.56 kg/m2  SpO2 98% Physical Exam  Nursing note and vitals reviewed. Constitutional: She is oriented to person, place, and time. She appears well-developed and well-nourished.  HENT:  Head: Normocephalic.  Mouth/Throat: Oropharynx is clear and moist. No oropharyngeal exudate.  Eyes: Conjunctivae and EOM are normal. Pupils are equal, round, and reactive to light.  Neck: Normal range of motion. Neck supple.  Cardiovascular: Normal rate, regular rhythm, normal heart sounds and intact distal pulses.  Exam reveals no gallop and no friction rub.   No murmur heard. Pulmonary/Chest: Effort normal and breath sounds normal. No respiratory distress. She has no wheezes.  Abdominal: Soft. Bowel sounds are normal. There is tenderness (mild ttp just to the left of the umbilicus). There is no rebound and no guarding.  Musculoskeletal: Normal range of motion. She exhibits no edema and no tenderness.  Neurological: She is alert and oriented to person, place, and time.  Skin: Skin is warm and dry.  Psychiatric: She has a normal mood and affect. Her behavior is normal.    ED Course  Procedures (including critical care time) Labs Review Labs Reviewed  COMPREHENSIVE METABOLIC PANEL - Abnormal; Notable for the following:    GFR calc non Af Amer 74 (*)    GFR calc Af Amer 86 (*)    All other components within normal limits  CBC WITH DIFFERENTIAL  TYPE AND SCREEN  PREPARE RBC (CROSSMATCH)  ABO/RH   Imaging Review Ct Abdomen Pelvis W Contrast  01/11/2013   CLINICAL DATA:  Diffuse abdominal pain, early satiety  EXAM: CT ABDOMEN  AND PELVIS WITH CONTRAST  TECHNIQUE: Multidetector CT imaging of the abdomen and pelvis was performed using the standard protocol following bolus administration of intravenous contrast.  CONTRAST:  OMNIPAQUE IOHEXOL 300 MG/ML  SOLN  COMPARISON:  None.  FINDINGS: Suspected mild emphysematous changes at the lung bases.  Liver, spleen, pancreas, and adrenal glands are within normal limits.  Gallbladder is unremarkable. No intrahepatic or extrahepatic ductal dilatation.  Kidneys are within normal limits. No hydronephrosis.  No evidence of bowel obstruction. Normal appendix.  5.4 x 4.8 cm infrarenal abdominal aortic aneurysm extending from just below the level of the left renal artery to the aortic bifurcation. Associated thickened wall/soft tissue rind (coronal image 32).  No abdominopelvic ascites.  No suspicious abdominopelvic lymphadenopathy.  Uterus and bilateral ovaries are unremarkable.  Bladder is within normal limits.  Mild degenerative changes of the visualized thoracolumbar spine.  IMPRESSION: 5.4 x 4.8 cm infrarenal abdominal aortic aneurysm with thickened wall/soft tissue rind, suggesting an inflammatory component. Surgical consultation is suggested.  These results were called by telephone at the time of interpretation on 01/11/2013 at 10:48 AM to Dr. Neena Rhymes , who verbally acknowledged these results.   Electronically Signed   By: Charline Bills M.D.   On: 01/11/2013 10:48    EKG Interpretation     Ventricular Rate:    PR Interval:    QRS Duration:   QT Interval:    QTC Calculation:   R Axis:     Text Interpretation:              Date: 01/11/2013  Rate: 76  Rhythm: normal sinus rhythm  QRS Axis: normal  Intervals: normal  ST/T Wave abnormalities: nonspecific ST changes  Conduction Disutrbances:none  Narrative Interpretation: V2 no longer inverted  Old EKG Reviewed: changes noted   MDM   1. AAA (abdominal aortic aneurysm)    12:42 PM 67 y.o. female who  presents with intermittent sharp left-sided abdominal pain for approximately 3 weeks. She notes the pain is worse with eating. She had a CT scan performed as an outpatient yesterday which showed the results below. She was seen here by her primary care physician to be evaluated by a vascular surgeon. She is afebrile and vital signs are unremarkable on exam. She is currently having 5/10 pain but refuses any pain medicine. Will consult vascular surgeon for evaluation.  CT on 01/10/13 showed a 5.4 x 4.8 cm infrarenal abdominal aortic aneurysm with thickened  wall/soft tissue rind, suggesting an inflammatory component.  Pt admitted to Vascular service.      Junius Argyle, MD 01/11/13 1537

## 2013-01-11 NOTE — ED Notes (Signed)
OR permit signed  ?

## 2013-01-11 NOTE — Telephone Encounter (Signed)
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  Recommended urgent vascular surgery consult  Please call pt to notify her of fact that she has an aneurysm and we will be referring to surgery for evaluation.

## 2013-01-11 NOTE — Assessment & Plan Note (Signed)
New.  Pt's pain is significant and lasting x3 weeks.  No associated vomiting or diarrhea to suggest viral illness.  Due to early satiety and pain, will get CT scan to assess.  Reviewed supportive care and red flags that should prompt return.  Pt expressed understanding and is in agreement w/ plan.

## 2013-01-11 NOTE — Telephone Encounter (Signed)
Pt notified of appt and results. Will be at the appt tomorrow.

## 2013-01-11 NOTE — Anesthesia Postprocedure Evaluation (Signed)
  Anesthesia Post-op Note  Patient: Amy Bates  Procedure(s) Performed: Procedure(s): ENDOVASCULAR STENT GRAFT INSERTION; ULTRASOUND GUIDED, Renal Stent left renal artery , open left brachial artery closure. (N/A)  Patient Location: PACU  Anesthesia Type:General  Level of Consciousness: awake  Airway and Oxygen Therapy: Patient Spontanous Breathing  Post-op Pain: mild  Post-op Assessment: Post-op Vital signs reviewed  Post-op Vital Signs: Reviewed  Complications: No apparent anesthesia complications

## 2013-01-11 NOTE — Preoperative (Signed)
Beta Blockers   Reason not to administer Beta Blockers:Not Applicable 

## 2013-01-11 NOTE — Op Note (Signed)
[facility]  Patient name: Amy Bates MRN: [id number] DOB: 05-09-1945 Sex: female  01/11/2013 Pre-operative Diagnosis: Symptomatic juxtarenal abdominal aortic aneurysm Post-operative diagnosis:  Same Surgeon:  Jorge Ny Assistants:  Lloyd Huger Procedure:   #1: Bilateral ultrasound-guided percutaneous common femoral artery access   #2: Open exposure of left brachial artery   #3: Catheter in aorta x2   #4: Endovascular repair of abdominal aortic aneurysm   #5: Distal extension x1   #6: Stent, left renal artery Anesthesia:  Gen. Blood Loss:  See anesthesia record Specimens:  None  Findings:  Complete exclusion  Indications:  The patient presented to the emergency department with several days history of abdominal pain.  She had a CT scan which showed a 5.4 cm aneurysm with inflammatory changes.  I felt that this was a etiology of her abdominal pain and recommended repair.  Because of her comorbidities I did not feel like she was a great candidate for open repair and therefore I elected to treat her juxtarenal aneurysm with a Snorkel technique  Devices used: Main body is a Biomedical scientist, primary right, 23 x 14 x 12.  Ipsilateral extension is a Biomedical scientist 10 x 7 contralateral left is a Biomedical scientist 12 x 14  Procedure:  The patient was identified in the holding area and taken to [facility] OR ROOM 16  The patient was then placed supine on the table. general anesthesia was administered.  The patient was prepped and draped in the usual sterile fashion.  A time out was called and antibiotics were administered.  The left brachial artery was visualized under ultrasound.  A longitudinal incision was made in the mid upper arm.  The brachial artery was then dissected out and encircled with Vesseloops proximally and distally.  The artery was very small approximately 4 mm.  This incision was packed with a moist gauze and attention was turned towards the groin.   Ultrasound was used to evaluate bilateral common femoral arteries.  There was minimal calcification.  A #11 blade was used to make a skin nick bilaterally.  Under ultrasound guidance, bilateral common femoral arteries were cannulated with 18-gauge needle.  An 035 wire was advanced into the aorta.  An 8 French sheath was used to dilate the subcutaneous tract.  Glide devices were deployed and the 11:00 and 1:00 position for pre-closure.  8 French sheaths were placed bilaterally.  Next, the left brachial artery was cannulated with a micropuncture needle.  An 018 wire was advanced without resistance, followed by a micropuncture sheath.  I then advanced a Benson wire and removed a micropuncture sheath, exchanging it with a 6 French 55 cm antral 1 sheath.  The patient was then fully heparinized.  The antral 1 sheath was then advanced into the descending thoracic aorta and positioned just at the level of the celiac artery.  Next, using a Vanchee 3 catheter, the left renal artery was cannulated.  A contrast injection was performed to confirm successful cannulation.  An 014 [place] core wire was then advanced into the left renal artery.  At this point, stiff wires were placed in the groin.  A 16 french sheath was advanced up the right side.  The main body device was prepared on the back table, this was a Biomedical scientist 23 x 14 x 12.  A Omni flush catheter from the left side was used to perform arteriography.  Is located the right renal artery which was the target for the  main body device.  At this point, I selected a 5 x 50 Viabahn stent and advanced it over the 014 wire from the left arm into the left renal artery.  Next, the main body was deployed landing at the level of the right renal artery.  Once I was satisfied with the location of the main body, I then deployed the Viabahn stent with the proximal edge approximately 5 mm above the top part of the main body.  Next, the contralateral gate was cannulated with a Omni  flush catheter and a Benson wire.  Retrograde injections were performed initially in the right anterior oblique position to locate the left hypogastric artery.  A Amplatz superstiff wire was then advanced up the left side and a 12 French sheath followed.  I then deployed the contralateral limb.  This was a Biomedical scientist 12 x 14.  The ipsilateral device was then fully deployed and the sheath was withdrawn.  A retrograde injection from the right groin with the image detector in the left anterior oblique position was performed.  The right hypogastric artery was identified a ipsilateral extension 10 x 7 Gore Excluder device was then deployed.  I then went to balloon the main body as well as the left renal artery stent.  A 5 x 40 balloon was placed into the left renal artery stent and a Q-50 balloon was advanced into the aorta.  The top was molded as were the overlaps of the devices from both the left and right leg.  A completion arteriogram was performed.  This point there was a type IA endoleak.  I reinserted the by 40 balloon into the left renal artery as well as the q.-50 balloon into the main body.  Repeat molding was then performed.  A completion arteriogram after these maneuvers showed resolution of the type I endoleak with preservation of both renal arteries and good opacification of the iliac legs.  At this point I was satisfied with our results.  Bentson wires were left in for closure.  The pro-glide devices were secured, both groins were hemostatic.  I then withdrew the sheath on the left brachial artery.  This artery was closed with interrupted sutures in a transverse fashion.  These were 3 7-0 Prolene sutures.  50 mg of protamine was then administered.  Once hemostasis was satisfactory the pro-glide sutures were cut and the groin and the skin closed with 4-0 Vicryl.  The incision in the left arm was closed with a layer of 3-0 Vicryl and a layer of 4-0 Vicryl.  Dermabond was applied.  There were no  complications.   Disposition:  To PACU in stable condition.   Juleen China, M.D. [facility] [place] Office: [phone] Pager:  [phone]

## 2013-01-11 NOTE — Telephone Encounter (Signed)
Noted Referral coordinator already working to get her scheduled. Forwarding message to her.

## 2013-01-11 NOTE — Assessment & Plan Note (Signed)
New.  Check labs to r/o thyroid abnormality, anemia, or electrolyte disturbance.  If labs normal, will consider neuro referral for confusion vs O2 monitor to determine if pt is having desats as she suspects.  Reviewed supportive care and red flags that should prompt return.  Pt expressed understanding and is in agreement w/ plan.

## 2013-01-11 NOTE — Anesthesia Procedure Notes (Addendum)
Procedures The patient was identified and consent was given.  Hand hygiene and sterile gloves were used.  Following the time-out, the brachial  region was prepped and draped in a sterile fashion.  The artery was located and a wire was inserted.  The skin was dilated and a 20cm, 20ga catheter was inserted.  The placement was confirmed by arterial flow.  The catheter was secured with a sterile dressing and sutured in place. The patient tolerated the procedure well. Start: 1440 End: 1450 J. Claybon Jabs, MD

## 2013-01-11 NOTE — Consult Note (Signed)
[facility] [place] CONSULT NOTE   MRN : [id number]  Reason for Consult: 5.4 cm symptomatic AAA - not ruptured Referring Physician: Sheliah Hatch, MD   History of Present Illness: Amy Bates is a 67 y.o. female, smoker, COPD, hx gastric ulcer who was seen in [place] for 3 week hx of abdominal pain left > right and periumbilical. Pt was seen in the ER and had CT scan today which showed 5.4 x 4.8 cm infrarenal abdominal aortic aneurysm with thickened  wall/soft tissue rind, suggesting an inflammatory component. This is not ruptured.  Pt has also been having pain radiating to her back and notes it is more painful with eating. She denies pain in her legs.  She is treated for hypercholesterolemia with a stain.  She is not on home oxygen for her COPD, but is limited in her walking by shortness of breath.  She continues to smoke 1 pk/day.   She does not take ASA because of her history of bleeding ulcers in the remote past.  No current facility-administered medications for this encounter.   Current Outpatient Prescriptions  Medication Sig Dispense Refill  . citalopram (CELEXA) 20 MG tablet TAKE 1 TABLET BY MOUTH EVERY DAY  30 tablet  3  . fenofibrate 160 MG tablet TAKE 1 TABLET BY MOUTH EVERY DAY  30 tablet  3  . fluticasone (FLONASE) 50 MCG/ACT nasal spray 2 sprays by Nasal route as needed.        Marland Kitchen guaifenesin (MUCUS RELIEF) 400 MG TABS Take 400 mg by mouth every 4 (four) hours.        Marland Kitchen levothyroxine (SYNTHROID, LEVOTHROID) 75 MCG tablet TAKE 1 TABLET BY MOUTH DAILY  30 tablet  3  . mometasone (ASMANEX 120 METERED DOSES) 220 MCG/INH inhaler Inhale 2 puffs into the lungs at bedtime.  1 Inhaler  6  . omeprazole (PRILOSEC) 20 MG capsule TAKE 1 CAPSULE BY MOUTH EVERY DAY  90 capsule  1  . Polyethylene Glycol 400 0.25 % SOLN Apply 2 drops to eye as needed.      . polyethylene glycol powder (GLYCOLAX/MIRALAX) powder Take 17 g by mouth 2 (two) times daily as needed.   3350 g  1  . PROAIR HFA 108 (90 BASE) MCG/ACT inhaler INHALE 2 PUFFS BY MOUTH EVERY 6 HOURS AS NEEDED FOR WHEEZING  8.5 g  2  . promethazine-dextromethorphan (PROMETHAZINE-DM) 6.25-15 MG/5ML syrup Take 5 mLs by mouth 4 (four) times daily as needed for cough.  240 mL  0  . simvastatin (ZOCOR) 40 MG tablet TAKE ONE TABLET BY MOUTH EVERY NIGHT AT BEDTIME  30 tablet  6  . SPIRIVA HANDIHALER 18 MCG inhalation capsule INHALE 1 CAPSULE VIA HANDIHALER ONCE DAILY  30 capsule  5    Pt meds include: Statin :Yes Betablocker: No ASA: No Other anticoagulants/antiplatelets: none  Past Medical History  Diagnosis Date  . Arthritis   . Allergic rhinitis   . Asthma   . Depression   . GERD (gastroesophageal reflux disease)   . Hyperlipidemia   . Urinary incontinence   . History of chicken pox   . COPD (chronic obstructive pulmonary disease)   . Pneumonia   . Chest pain     [facility]  . Hypothyroid     Past Surgical History  Procedure Laterality Date  . Tubal ligation    . Tonsillectomy    . Knee surgery      right    Social History  History  Substance Use Topics  . Smoking status: Current Every Day Smoker  . Smokeless tobacco: Not on file  . Alcohol Use: Yes     Comment: occ.    Family History Family History  Problem Relation Age of Onset  . Coronary artery disease Mother   . Stroke Mother   . Hypertension Neg Hx   . Diabetes Neg Hx   . Cancer Neg Hx     No Known Allergies   REVIEW OF SYSTEMS  General: [ ]  Weight loss, [ ]  Fever, [ ]  chills Neurologic: [ ]  Dizziness, [ ]  Blackouts, [ ]  Seizure [ ]  Stroke, [ ]  "Mini stroke", [ ]  Slurred speech, [ ]  Temporary blindness; [ ]  weakness in arms or legs, [ ]  Hoarseness [ ]  Dysphagia Cardiac: [ ]  Chest pain/pressure, [ ]  Shortness of breath at rest [x ] Shortness of breath with exertion, [ ]  Atrial fibrillation or irregular heartbeat  Vascular: [ ]  Pain in legs with walking, [ ]  Pain in legs at rest, [ ]  Pain in legs at  night,  [ ]  Non-healing ulcer, [ ]  Blood clot in vein/DVT,   Pulmonary: [ ]  Home oxygen, [ ]  Productive cough, [ ]  Coughing up blood, [ ]  Asthma,  [ ]  Wheezing [x ] COPD Musculoskeletal:  [ ]  Arthritis, [ ]  Low back pain, [ ]  Joint pain Hematologic: [ ]  Easy Bruising, [ ]  Anemia; [ ]  Hepatitis Gastrointestinal: [ ]  Blood in stool, [x ] Gastroesophageal Reflux/heartburn, Urinary: [ ]  chronic Kidney disease, [ ]  on HD - [ ]  MWF or [ ]  TTHS, [ ]  Burning with urination, [ ]  Difficulty urinating Skin: [ ]  Rashes, [ ]  Wounds Psychological: [ ]  Anxiety, [ ]  Depression  Physical Examination Filed Vitals:   01/11/13 1212  BP: 128/59  Pulse: 85  Temp: 98.1 F (36.7 C)  TempSrc: Oral  Resp: 16  Weight: 144 lb 1 oz (65.346 kg)  SpO2: 98%   Body mass index is 22.56 kg/(m^2).  General:  WDWN in NAD Gait: Normal HEENT: WNL Eyes: Pupils equal Pulmonary: normal non-labored breathing , with Rales R base Cardiac: RRR, without  Murmurs, rubs or gallops; No carotid bruits Abdomen: soft, + periumbilical and LLQ tenderness Skin: no rashes, ulcers noted;  no Gangrene , no cellulitis; no open wounds;   Vascular Exam/Pulses: 2+ radial, DP pulses bilaterally Musculoskeletal: no muscle wasting or atrophy; no edema  Neurologic: A&O X 3; Appropriate Affect ;  SENSATION: normal; MOTOR FUNCTION: 5/5 Symmetric Speech is fluent/normal   Significant Diagnostic Studies: CBC Lab Results  Component Value Date   WBC 6.4 01/10/2013   HGB 14.3 01/10/2013   HCT 41.8 01/10/2013   MCV 90.7 01/10/2013   PLT 251.0 01/10/2013    BMET    Component Value Date/Time   NA 138 01/10/2013 1232   K 4.6 01/10/2013 1232   CL 101 01/10/2013 1232   CO2 28 01/10/2013 1232   GLUCOSE 86 01/10/2013 1232   GLUCOSE 94 01/14/2010   BUN 13 01/10/2013 1232   CREATININE 0.8 01/10/2013 1232   CALCIUM 9.5 01/10/2013 1232   The CrCl is unknown because both a height and weight (above a minimum accepted value) are required  for this calculation.   ASSESSMENT/PLAN: Abdominal pan radiating to her back-symptomatic 5.4cm AAA EVAR AAA today   ROCZNIAK,REGINA J 01/11/2013 12:58 PM  I agree with the above.  The patient was seen and examined.  She has a symptomatic AAA.  I feel that  this is a sign of pending rupture and it needs to be repaired.  We discussed open vs endovascular repair.  She will require a left renal snorkel to place a stent graft.  I discussed the risks and benefits including GI complications, stroke, LE complications and death.  They wish to proceed.   Durene Cal

## 2013-01-11 NOTE — ED Notes (Signed)
Reports having mid to lower abd pain x 3 weeks, went for ct scan this am and called to come to ED due to AAA on scan. Pt presents stable, no acute distress noted.

## 2013-01-11 NOTE — Telephone Encounter (Signed)
Per Annabelle Harman @ [facility], one of their surgeons reviewed pts chart and advised that she go to [facility] ED. Pt is on her way there to meet their surgeon on call.

## 2013-01-12 ENCOUNTER — Encounter: Payer: Medicare Other | Admitting: Vascular Surgery

## 2013-01-12 ENCOUNTER — Other Ambulatory Visit: Payer: Self-pay | Admitting: *Deleted

## 2013-01-12 DIAGNOSIS — I714 Abdominal aortic aneurysm, without rupture: Secondary | ICD-10-CM

## 2013-01-12 DIAGNOSIS — Z48812 Encounter for surgical aftercare following surgery on the circulatory system: Secondary | ICD-10-CM

## 2013-01-12 LAB — BASIC METABOLIC PANEL
Creatinine, Ser: 0.68 mg/dL (ref 0.50–1.10)
GFR calc Af Amer: >90 mL/min (ref >90)
GFR calc non Af Amer: 89 mL/min — ABNORMAL LOW (ref >90)
Glucose, Bld: 112 mg/dL — ABNORMAL HIGH (ref 70–99)
Potassium: 3.6 mEq/L (ref 3.5–5.1)
Sodium: 135 mEq/L (ref 135–145)

## 2013-01-12 LAB — CBC
MCH: 31.6 pg (ref 26.0–34.0)
MCHC: 35 g/dL (ref 30.0–36.0)
Platelets: 199 10*3/uL (ref 150–400)
RBC: 3.99 MIL/uL (ref 3.87–5.11)
RDW: 12.5 % (ref 11.5–15.5)

## 2013-01-12 MED ORDER — SODIUM CHLORIDE 0.9 % IV BOLUS (SEPSIS)
500.0000 mL | Freq: Once | INTRAVENOUS | Status: AC
  Administered 2013-01-12: 500 mL via INTRAVENOUS

## 2013-01-12 NOTE — Progress Notes (Signed)
Pt transferred from 2S-03 to 2W-13 via wheelchair, on monitor. VSS, right 18 g PIV and left 18 g PIV intact upon arrival, NS infusing at 75 ml/hr via right wrist IV. Pt ambulated from wheelchair to bed without difficulty. No complaints, no apparent distress. Transferred wearing eyeglasses and own underwear, states she has no other belongings at the hospital. Attempted to call husband Kaiah Hosea x 2 to notify of transfer via [phone] as listed on chart and per pt, but no answer. Report given to Caryl Comes, RN.

## 2013-01-12 NOTE — Progress Notes (Addendum)
[facility] [place]  Post-op EVAR Date of Surgery: 01/11/2013 Surgeon: Surgeon(s): Nada Libman, MD POD: 1 Day Post-Op Device: Main body is a Biomedical scientist, primary right, 23 x 14 x 12. Ipsilateral extension is a Biomedical scientist 10 x 7 contralateral left is a Biomedical scientist 12 x 14 Stent, left renal artery  Subjective  Amy Bates is a 67 y.o. female who is s/p EVAR. The patient denies back pain; denies abdominal pain; denies lower extremity pain.  She is Ambulating. Had some  Nausea/ vomiting last pm. No Nausea this am - passing flatus. Left hand without problems. Pt. has not voided with foley out.  Significant Diagnostic Studies: CBC Lab Results  Component Value Date   WBC 7.8 01/12/2013   HGB 12.6 01/12/2013   HCT 36.0 01/12/2013   MCV 90.2 01/12/2013   PLT 199 01/12/2013     BMET    Component Value Date/Time   NA 135 01/12/2013 0410   K 3.6 01/12/2013 0410   CL 101 01/12/2013 0410   CO2 25 01/12/2013 0410   GLUCOSE 112* 01/12/2013 0410   GLUCOSE 94 01/14/2010   BUN 10 01/12/2013 0410   CREATININE 0.68 01/12/2013 0410   CALCIUM 8.8 01/12/2013 0410   GFRNONAA 89* 01/12/2013 0410   GFRAA >90 01/12/2013 0410    COAG Lab Results  Component Value Date   INR 1.08 01/11/2013   No results found for this basename: PTT     I/O last 3 completed shifts: In: 3526.2 [I.V.:3226.2; IV Piggyback:300] Out: 2585 [Urine:2385; Blood:200] No data found.   Physical Examination  BP Readings from Last 3 Encounters:  01/12/13 86/38  01/12/13 86/38  01/10/13 124/80   Temp Readings from Last 3 Encounters:  01/12/13 98.5 F (36.9 C) Oral  01/12/13 98.5 F (36.9 C) Oral  01/10/13 98 F (36.7 C) Oral   SpO2 Readings from Last 3 Encounters:  01/12/13 96%  01/12/13 96%  01/10/13 95%   Pulse Readings from Last 3 Encounters:  01/12/13 91  01/12/13 91  01/10/13 65    General: A&O x 3, WDWN female in NAD Gait: Normal Pulmonary: normal  non-labored breathing  Cardiac: RRR Abdomen: soft, NT, minimal BS Left UA wound healing Bilateral groin wounds: clean, dry, intact, without hematoma Vascular Exam/Pulses: 2+ radial and brachial pulses palp bilat DP pulses palp bilat  Neurologic: A&O X 3; Appropriate Affect  Assessment: Amy Bates is a 67 y.o. female who is 1 Day Post-Op EVAR/left renal stent.  Pt is doing well with no complaints Renal function normal N/V resolved - positive flatus Hypotension - on dopamine - not on antihypertensives at home   Plan: transfer to floor if weaned off dopamine  The importance of surveillance of the endograft was discussed with the patient  A CTA of abdomen and pelvis will be scheduled for one month to assess for endoleak.  The patient will follow up with [place] in one month with these studies.   SignedMarlowe Shores  01/12/2013 8:15 AM.

## 2013-01-13 ENCOUNTER — Telehealth: Payer: Self-pay | Admitting: Surgery

## 2013-01-13 MED ORDER — OXYCODONE HCL 5 MG PO TABS: 5.0000 mg | ORAL_TABLET | Freq: Four times a day (QID) | ORAL | Status: DC | PRN

## 2013-01-13 NOTE — Progress Notes (Addendum)
[facility] Progress Note  01/13/2013 7:47 AM 2 Days Post-Op  Subjective:  "tired of the broth-too salty to eat"  Afebrile VSS 96%-92% RA  Filed Vitals:   01/13/13 0743  BP:   Pulse: 89  Temp:   Resp:     Physical Exam: Incisions:  Left arm incision c/d/i with ecchymosis-pt states it has improved; bilateral groins are soft without hematoma Extremities:  + palpable DP pulses bilaterally Cardiac:  RRR Lungs:  Non-labored Abdomen:  Soft, NT/ND  CBC    Component Value Date/Time   WBC 7.8 01/12/2013 0410   RBC 3.99 01/12/2013 0410   HGB 12.6 01/12/2013 0410   HCT 36.0 01/12/2013 0410   PLT 199 01/12/2013 0410   MCV 90.2 01/12/2013 0410   MCH 31.6 01/12/2013 0410   MCHC 35.0 01/12/2013 0410   RDW 12.5 01/12/2013 0410   LYMPHSABS 2.3 01/11/2013 1250   MONOABS 0.5 01/11/2013 1250   EOSABS 0.1 01/11/2013 1250   BASOSABS 0.0 01/11/2013 1250    BMET    Component Value Date/Time   NA 135 01/12/2013 0410   K 3.6 01/12/2013 0410   CL 101 01/12/2013 0410   CO2 25 01/12/2013 0410   GLUCOSE 112* 01/12/2013 0410   GLUCOSE 94 01/14/2010   BUN 10 01/12/2013 0410   CREATININE 0.68 01/12/2013 0410   CALCIUM 8.8 01/12/2013 0410   GFRNONAA 89* 01/12/2013 0410   GFRAA >90 01/12/2013 0410    INR    Component Value Date/Time   INR 1.08 01/11/2013 1900     Intake/Output Summary (Last 24 hours) at 01/13/13 0747 Last data filed at 01/13/13 0640  Gross per 24 hour  Intake 2795.45 ml  Output   1450 ml  Net 1345.45 ml     Assessment:  67 y.o. female is s/p:  #1: Bilateral ultrasound-guided percutaneous common femoral artery access  #2: Open exposure of left brachial artery  #3: Catheter in aorta x2  #4: Endovascular repair of abdominal aortic aneurysm  #5: Distal extension x1  #6: Stent, left renal artery   2 Days Post-Op  Plan: -doing well this am. She is off of dopamine -will advance diet and d/c IVF -BUN/Cr normal with good UOP -home  today -f/u with Dr. Myra Gianotti in 4 weeks with CTA -DVT prophylaxis:  Lovenox   Doreatha Massed, PA-C [facility] [phone] 01/13/2013 7:47 AM

## 2013-01-13 NOTE — Telephone Encounter (Addendum)
Message copied by Fredrich Birks on Thu Jan 13, 2013  2:48 PM ------      Message from: Carmen, [place] K      Created: Wed Jan 12, 2013  8:32 AM      Regarding: schedule                   ----- Message -----         From: Dara Lords, PA-C         Sent: 01/11/2013   4:13 PM           To: Sharee Pimple, CMA            S/p EVAR for rupture AAA 01/11/13.  F/u in 4 weeks with EVAR CTA protocol with Dr. Myra Gianotti.            Thanks,      Lelon Mast ------  01/13/13: left detailed message on home # re CTA appt and MD follow up, dpm

## 2013-01-13 NOTE — Discharge Summary (Signed)
[facility] Discharge Summary  Amy Bates 1945-04-30 67 y.o. female  [id number]  Admission Date: 01/11/2013  Discharge Date: 01/13/13  Physician: Nada Libman, MD  Admission Diagnosis: AAA (abdominal aortic aneurysm) [441.4]   HPI:   This is a 67 y.o. female smoker, COPD, hx gastric ulcer who was seen in [place] for 3 week hx of abdominal pain left > right and periumbilical. Pt was seen in the ER and had CT scan today which showed 5.4 x 4.8 cm infrarenal abdominal aortic aneurysm with thickened  wall/soft tissue rind, suggesting an inflammatory component. This is not ruptured.  Pt has also been having pain radiating to her back and notes it is more painful with eating. She denies pain in her legs.  She is treated for hypercholesterolemia with a stain. She is not on home oxygen for her COPD, but is limited in her walking by shortness of breath. She continues to smoke 1 pk/day. She does not take ASA because of her history of bleeding ulcers in the remote past.   Hospital Course:  The patient was admitted to the hospital and taken to the operating room on 01/11/2013 and underwent: #1: Bilateral ultrasound-guided percutaneous common femoral artery access  #2: Open exposure of left brachial artery  #3: Catheter in aorta x2  #4: Endovascular repair of abdominal aortic aneurysm  #5: Distal extension x1  #6: Stent, left renal artery    The pt tolerated the procedure well and was transported to the PACU in good condition. By POD 1, she was requiring some dopamine for BP support, but this was weaned successfully.  She was transferred to the telemetry floor on POD 1.  The remainder of the hospital course consisted of increasing mobilization and increasing intake of solids without difficulty.  CBC    Component Value Date/Time   WBC 7.8 01/12/2013 0410   RBC 3.99 01/12/2013 0410   HGB 12.6 01/12/2013 0410   HCT 36.0 01/12/2013 0410   PLT 199 01/12/2013  0410   MCV 90.2 01/12/2013 0410   MCH 31.6 01/12/2013 0410   MCHC 35.0 01/12/2013 0410   RDW 12.5 01/12/2013 0410   LYMPHSABS 2.3 01/11/2013 1250   MONOABS 0.5 01/11/2013 1250   EOSABS 0.1 01/11/2013 1250   BASOSABS 0.0 01/11/2013 1250    BMET    Component Value Date/Time   NA 135 01/12/2013 0410   K 3.6 01/12/2013 0410   CL 101 01/12/2013 0410   CO2 25 01/12/2013 0410   GLUCOSE 112* 01/12/2013 0410   GLUCOSE 94 01/14/2010   BUN 10 01/12/2013 0410   CREATININE 0.68 01/12/2013 0410   CALCIUM 8.8 01/12/2013 0410   GFRNONAA 89* 01/12/2013 0410   GFRAA >90 01/12/2013 0410     Discharge Instructions:   The patient is discharged to home with extensive instructions on wound care and progressive ambulation.  They are instructed not to drive or perform any heavy lifting until returning to see the physician in his office.  Discharge Orders   Future Appointments Provider Department Dept Phone   01/31/2013 9:00 AM Sheliah Hatch, MD [facility]  [place] [phone]   02/07/2013 1:30 PM Nada Libman, MD [facility] [phone]   Future Orders Complete By Expires   ABDOMINAL PROCEDURE/ANEURYSM REPAIR/AORTO-BIFEMORAL BYPASS:  Call MD for increased abdominal pain; cramping diarrhea; nausea/vomiting  As directed    ABDOMINAL PROCEDURE/ANEURYSM REPAIR/AORTO-BIFEMORAL BYPASS:  Call MD for increased abdominal pain; cramping diarrhea; nausea/vomiting  As directed    Call MD for:  redness, tenderness, or signs of infection (pain, swelling, bleeding, redness, odor or green/yellow discharge around incision site)  As directed    Call MD for:  redness, tenderness, or signs of infection (pain, swelling, bleeding, redness, odor or green/yellow discharge around incision site)  As directed    Call MD for:  severe or increased pain, loss or decreased feeling  in affected limb(s)  As directed    Call MD for:  severe or increased pain, loss or  decreased feeling  in affected limb(s)  As directed    Call MD for:  temperature >100.5  As directed    Call MD for:  temperature >100.5  As directed    Discharge wound care:  As directed    Comments:     Shower daily with soap and water starting 01/13/13   Discharge wound care:  As directed    Comments:     Shower daily with soap and water starting 01/14/13   Driving Restrictions  As directed    Comments:     No driving for 2 weeks   Driving Restrictions  As directed    Comments:     No driving for 2 weeks and while taking pain medication.   Lifting restrictions  As directed    Comments:     No lifting for 4 weeks   Lifting restrictions  As directed    Comments:     No lifting for 4 weeks   Resume previous diet  As directed    Resume previous diet  As directed       Discharge Diagnosis:  AAA (abdominal aortic aneurysm) [441.4]  Secondary Diagnosis: Patient Active Problem List   Diagnosis Date Noted  . AAA (abdominal aortic aneurysm) 01/11/2013  . Abdominal pain, diffuse 01/10/2013  . Subacute confusional state 01/10/2013  . COPD exacerbation 06/01/2012  . Bronchitis 12/17/2010  . Insomnia 10/27/2010  . Tobacco use disorder 07/30/2010  . General medical examination 07/22/2010  . Weight loss 07/22/2010  . HYPOTHYROIDISM 03/26/2010  . HYPERLIPIDEMIA 03/21/2010  . DEPRESSION 03/21/2010  . ALLERGIC RHINITIS 03/21/2010  . ASTHMA 03/21/2010  . COPD 03/21/2010  . GERD 03/21/2010  . URINARY INCONTINENCE 03/21/2010   Past Medical History  Diagnosis Date  . Arthritis   . Allergic rhinitis   . Asthma   . Depression   . GERD (gastroesophageal reflux disease)   . Hyperlipidemia   . Urinary incontinence   . History of chicken pox   . COPD (chronic obstructive pulmonary disease)   . Pneumonia   . Chest pain     [facility]  . Hypothyroid        Medication List         albuterol 108 (90 BASE) MCG/ACT inhaler  Commonly known as:  PROVENTIL HFA;VENTOLIN HFA   Inhale 2 puffs into the lungs every 6 (six) hours as needed for wheezing.     citalopram 20 MG tablet  Commonly known as:  CELEXA  Take 20 mg by mouth every other day.     fenofibrate 160 MG tablet  Take 160 mg by mouth daily.     guaifenesin 400 MG Tabs tablet  Commonly known as:  HUMIBID E  Take 400 mg by mouth every 4 (four) hours as needed (for congestion).     levothyroxine 75 MCG tablet  Commonly known as:  SYNTHROID, LEVOTHROID  Take 75 mcg by mouth daily before breakfast.  mometasone 220 MCG/INH inhaler  Commonly known as:  ASMANEX  Inhale 2 puffs into the lungs daily.     omeprazole 20 MG capsule  Commonly known as:  PRILOSEC  Take 20 mg by mouth daily.     oxyCODONE 5 MG immediate release tablet  Commonly known as:  ROXICODONE  Take 1 tablet (5 mg total) by mouth every 6 (six) hours as needed for pain.     Polyethylene Glycol 400 0.25 % Soln  Apply 2 drops to eye as needed.     polyethylene glycol packet  Commonly known as:  MIRALAX / GLYCOLAX  Take 17 g by mouth 2 (two) times daily as needed (for constipation).     simvastatin 40 MG tablet  Commonly known as:  ZOCOR  Take 40 mg by mouth every evening.     tiotropium 18 MCG inhalation capsule  Commonly known as:  SPIRIVA  Place 18 mcg into inhaler and inhale daily.        Roxicodone #30 No Refill  Disposition: home  Patient's condition: is Good  Follow up: 1. Dr. Myra Gianotti in 4 weeks with CTA   Doreatha Massed, PA-C [facility] [phone] 01/13/2013  7:56 AM   - For VQI Registry use --- Instructions: Press F2 to tab through selections.  Delete question if not applicable.   Post-op:  Time to Extubation: [x ] In OR, [ ]  < 12 hrs, [ ]  12-24 hrs, [ ]  >=24 hrs Vasopressors Req. Post-op: Yes-dopamine-weaned successfully MI: no, [ ]  Troponin only, [ ]  EKG or Clinical New Arrhythmia: No CHF: No ICU Stay: 1 day in stepdown Transfusion: No  If yes, n/a units  given  Complications: Resp failure: no, [ ]  Pneumonia, [ ]  Ventilator Chg in renal function: no, [ ]  Inc. Cr > 0.5, [ ]  Temp. Dialysis, [ ]  Permanent dialysis Leg ischemia: no, no Surgery needed, [ ]  Yes, Surgery needed, [ ]  Amputation Bowel ischemia: no, [ ]  Medical Rx, [ ]  Surgical Rx Wound complication: no, [ ]  Superficial separation/infection, [ ]  Return to OR Return to OR: No  Return to OR for bleeding: No Stroke: no, [ ]  Minor, [ ]  Major  Discharge medications: Statin use:  Yes If No: [ ]  For Medical reasons, [ ]  Non-compliant, [ ]  Not-indicated ASA use:  No  If No: [ ]  For Medical reasons, [ ]  Non-compliant, [ ]  Not-indicated Plavix use:  No If No: [ ]  For Medical reasons, [ ]  Non-compliant, [ ]  Not-indicated Beta blocker use:  No If No: [ ]  For Medical reasons, [ ]  Non-compliant, [ ]  Not-indicated

## 2013-01-13 NOTE — Progress Notes (Signed)
Abdominal pain resolved Tolerating regular diet Ecchymosis on left arm improved OK for d/c  F/u 1 month with CTA  WElls Nathalia Wismer

## 2013-01-15 LAB — TYPE AND SCREEN
Unit division: 0
Unit division: 0

## 2013-01-16 NOTE — Discharge Summary (Signed)
I agree with the above  Amy Bates 

## 2013-01-18 ENCOUNTER — Encounter (HOSPITAL_COMMUNITY): Payer: Self-pay | Admitting: Surgery

## 2013-01-25 ENCOUNTER — Other Ambulatory Visit: Payer: Self-pay | Admitting: Family Medicine

## 2013-01-25 NOTE — Telephone Encounter (Signed)
Med filled.  

## 2013-01-31 ENCOUNTER — Other Ambulatory Visit: Payer: Self-pay | Admitting: General Practice

## 2013-01-31 ENCOUNTER — Ambulatory Visit (INDEPENDENT_AMBULATORY_CARE_PROVIDER_SITE_OTHER): Payer: Medicare Other | Admitting: Family Medicine

## 2013-01-31 ENCOUNTER — Encounter: Payer: Self-pay | Admitting: Family Medicine

## 2013-01-31 VITALS — BP 140/90 | HR 87 | Temp 98.2°F | Resp 16 | Wt 140.5 lb

## 2013-01-31 DIAGNOSIS — F3289 Other specified depressive episodes: Secondary | ICD-10-CM

## 2013-01-31 DIAGNOSIS — E785 Hyperlipidemia, unspecified: Secondary | ICD-10-CM

## 2013-01-31 DIAGNOSIS — F17200 Nicotine dependence, unspecified, uncomplicated: Secondary | ICD-10-CM

## 2013-01-31 DIAGNOSIS — F329 Major depressive disorder, single episode, unspecified: Secondary | ICD-10-CM

## 2013-01-31 DIAGNOSIS — I714 Abdominal aortic aneurysm, without rupture, unspecified: Secondary | ICD-10-CM

## 2013-01-31 DIAGNOSIS — Z23 Encounter for immunization: Secondary | ICD-10-CM

## 2013-01-31 LAB — HEPATIC FUNCTION PANEL
Albumin: 3.3 g/dL — ABNORMAL LOW (ref 3.5–5.2)
Alkaline Phosphatase: 52 U/L (ref 39–117)
Total Bilirubin: 0.3 mg/dL (ref 0.3–1.2)

## 2013-01-31 LAB — LIPID PANEL
Cholesterol: 206 mg/dL — ABNORMAL HIGH (ref 0–200)
HDL: 32.6 mg/dL — ABNORMAL LOW (ref >39.00)
Total CHOL/HDL Ratio: 6
VLDL: 33.2 mg/dL (ref 0.0–40.0)

## 2013-01-31 MED ORDER — ATORVASTATIN CALCIUM 20 MG PO TABS: 20.0000 mg | ORAL_TABLET | Freq: Every day | ORAL | Status: DC

## 2013-01-31 MED ORDER — CITALOPRAM HYDROBROMIDE 20 MG PO TABS: 20.0000 mg | ORAL_TABLET | Freq: Every day | ORAL | Status: DC

## 2013-01-31 MED ORDER — ATORVASTATIN CALCIUM 40 MG PO TABS: 40.0000 mg | ORAL_TABLET | Freq: Every day | ORAL | Status: DC

## 2013-01-31 MED ORDER — TRAZODONE HCL 50 MG PO TABS: 25.0000 mg | ORAL_TABLET | Freq: Every evening | ORAL | Status: DC | PRN

## 2013-01-31 NOTE — Progress Notes (Signed)
  Subjective:    Patient ID: Amy Bates, female    DOB: Sep 25, 1945, 67 y.o.   MRN: [id number]  HPI Hyperlipidemia- chronic problem, on statin.  Denies abd pain, N/V, myalgias.  AAA- pt was found to have a large symptomatic AAA on CT and went to ER and was admitted by vascular for emergency surgery.  Had intravascular stenting done.  Denies CP, SOB, HAs, visual changes, edema.  Tobacco use- pt quit as of 10/28 en route to the hospital.  'i have no desire to smoke'.  Depression- husband reports 'she goes to pieces all the time'.  Only taking Celexa every other day.  Has not considered counseling.  'it's just a big scare'.  Not sleeping, not eating.  Frequently tearful.   Review of Systems For ROS see HPI     Objective:   Physical Exam  Vitals reviewed. Constitutional: She is oriented to person, place, and time. She appears well-developed and well-nourished. She appears distressed (tearful, very upset).  Cardiovascular: Normal rate, regular rhythm, normal heart sounds and intact distal pulses.   Pulmonary/Chest: Effort normal and breath sounds normal. No respiratory distress. She has no wheezes. She has no rales.  Abdominal: Soft. Bowel sounds are normal. She exhibits no distension. There is no tenderness. There is no rebound and no guarding.  Neurological: She is alert and oriented to person, place, and time.  Skin: Skin is warm and dry.  Psychiatric:  Tearful, anxious          Assessment & Plan:

## 2013-01-31 NOTE — Patient Instructions (Signed)
Follow up in 1 month to recheck mood We'll notify you of your lab results and make any changes if needed Start the Celexa EVERY DAY! Use the trazodone nightly for sleep- start w/ 1/2 tab Start Enteric Coated Aspirin 81mg  daily It's ok to be emotional- this was a big deal!  Talk about your feelings Sherri Rad in there!!!

## 2013-01-31 NOTE — Assessment & Plan Note (Signed)
Deteriorated.  Pt to increase Celexa daily.  Start trazodone for sleep.  Encouraged open communication b/c pt and spouse to do agree on how she should be handling this health scare and subsequent surgery.  Discussed possibility of counseling but pt is stressed about finances.  Will follow closely.

## 2013-01-31 NOTE — Assessment & Plan Note (Signed)
New to provider.  Identified on CT and emergently repaired w/ endovascular stent.  Pt is going to start enteric coated ASA 81 mg daily and follow w/ vascular as instructed.  No longer smoking!

## 2013-01-31 NOTE — Assessment & Plan Note (Signed)
QUIT as of 01/11/13!  Applauded her efforts.

## 2013-01-31 NOTE — Assessment & Plan Note (Signed)
Chronic problem.  Tolerating statin w/out difficulty.  Check labs.  Adjust meds prn  

## 2013-02-01 ENCOUNTER — Telehealth: Payer: Self-pay | Admitting: Family Medicine

## 2013-02-01 NOTE — Telephone Encounter (Signed)
Patient called stating that she went to the pharmacy to pick up her Lipitor but there was two prescription for it. One was for (40mg ) and the other one was for (20mg ). Please call patient thanks.

## 2013-02-01 NOTE — Telephone Encounter (Signed)
Pt notified to not use the lipitor 20mg .

## 2013-02-04 ENCOUNTER — Telehealth: Payer: Self-pay

## 2013-02-04 ENCOUNTER — Encounter: Payer: Self-pay | Admitting: Surgery

## 2013-02-04 NOTE — Telephone Encounter (Signed)
Rec'd call from technician at [facility] regarding "abnormal Cr. 1.68, GFR 31 on 01/12/13."  Requesting to have pt. hydrate over weekend, prior to repeat labs, and CTA abd/ pelvis on 02/07/13.  Nurse rechecked lab values from 10/29, and noted the Cr. 0.68, and GFR 89 10/29; Cr 0.70, GFR 88 01/11/13.  Contacted Jean at [facility] regarding lab values.  Stated to disregard previous request to repeat labs and increase hydration, as there was inaccurate information reported intially.  Pt. scheduled to have CTA abd/ pelvis 11/24 and follow-up with Dr. Myra Gianotti following CTA.

## 2013-02-07 ENCOUNTER — Encounter: Payer: Self-pay | Admitting: Surgery

## 2013-02-07 ENCOUNTER — Ambulatory Visit
Admission: RE | Admit: 2013-02-07 | Discharge: 2013-02-07 | Disposition: A | Discharge status: Home / Self Care | Payer: Medicare Other | Source: Ambulatory Visit | Attending: Surgery | Admitting: Surgery

## 2013-02-07 ENCOUNTER — Ambulatory Visit (INDEPENDENT_AMBULATORY_CARE_PROVIDER_SITE_OTHER): Payer: Medicare Other | Admitting: Surgery

## 2013-02-07 VITALS — BP 135/75 | HR 86 | Ht 69.0 in | Wt 138.0 lb

## 2013-02-07 DIAGNOSIS — Z48812 Encounter for surgical aftercare following surgery on the circulatory system: Secondary | ICD-10-CM

## 2013-02-07 DIAGNOSIS — I714 Abdominal aortic aneurysm, without rupture, unspecified: Secondary | ICD-10-CM | POA: Insufficient documentation

## 2013-02-07 MED ORDER — IOHEXOL 350 MG/ML SOLN
100.0000 mL | Freq: Once | INTRAVENOUS | Status: AC | PRN
  Administered 2013-02-07: 100 mL via INTRAVENOUS

## 2013-02-07 NOTE — Progress Notes (Signed)
The patient is here today for followup.  She presented to the hospital on 01/11/2013 with a symptomatic juxtarenal abdominal aortic aneurysm.  Her CT scan showed a 5.4 cm aneurysm with inflammatory changes.  I felt this was the etiology of her back pain.  Because of the inflammatory component I felt a percutaneous approach was more appropriate.  This did require snorkel technique for the left renal artery.  The patient's abdominal pain has now resolved.  Her only complaint today is that of some numbness around her left brachial exposure.  Her femoral sites are healing nicely.  CT angiogram was performed today.  This shows good exclusion of the aneurysm, however there has been interval occlusion of the left renal stent.  I discussed the CT scan findings with the patient and her husband.  I have encouraged her to stay hydrated.  I do not think there is anything that can be done to salvage the left renal stent.  I have scheduled the patient to come back in 6 months for carotid ultrasound as well as abdominal duplex.

## 2013-02-09 NOTE — Addendum Note (Signed)
Addended by: Melodye Ped C on: 02/09/2013 09:03 AM   Modules accepted: Orders

## 2013-02-14 ENCOUNTER — Other Ambulatory Visit: Payer: Self-pay | Admitting: Family Medicine

## 2013-02-14 NOTE — Telephone Encounter (Signed)
Med filled.  

## 2013-03-01 ENCOUNTER — Ambulatory Visit (INDEPENDENT_AMBULATORY_CARE_PROVIDER_SITE_OTHER): Payer: Medicare Other | Admitting: Family Medicine

## 2013-03-01 ENCOUNTER — Encounter: Payer: Self-pay | Admitting: Family Medicine

## 2013-03-01 VITALS — BP 130/82 | HR 80 | Temp 98.2°F | Resp 16 | Wt 138.2 lb

## 2013-03-01 DIAGNOSIS — R413 Other amnesia: Secondary | ICD-10-CM

## 2013-03-01 DIAGNOSIS — R519 Headache, unspecified: Secondary | ICD-10-CM | POA: Insufficient documentation

## 2013-03-01 DIAGNOSIS — Z8249 Family history of ischemic heart disease and other diseases of the circulatory system: Secondary | ICD-10-CM | POA: Insufficient documentation

## 2013-03-01 DIAGNOSIS — F329 Major depressive disorder, single episode, unspecified: Secondary | ICD-10-CM

## 2013-03-01 DIAGNOSIS — N280 Ischemia and infarction of kidney: Secondary | ICD-10-CM | POA: Insufficient documentation

## 2013-03-01 DIAGNOSIS — Z823 Family history of stroke: Secondary | ICD-10-CM

## 2013-03-01 DIAGNOSIS — N2889 Other specified disorders of kidney and ureter: Secondary | ICD-10-CM

## 2013-03-01 DIAGNOSIS — R51 Headache: Secondary | ICD-10-CM

## 2013-03-01 NOTE — Patient Instructions (Signed)
Follow up in 6-8 weeks to recheck memory and mood We'll call you with your neuro appt Try and relax- a lot of the forgetfulness is probably due to stress and anxiety Call if your symptoms change or worsen We'll notify you of your lab results Call with any questions or concerns Happy Holidays!!!

## 2013-03-01 NOTE — Progress Notes (Signed)
   Subjective:    Patient ID: Amy Bates, female    DOB: 09-Mar-1946, 67 y.o.   MRN: [id number]  HPI Depression- pt feels sxs have improved, husband admits that 'i'm stressing her out over mom's estate'.  Pt is no longer tearful, able to hold it together.  Memory loss- husband reports she's more forgetful.  Husband is finishing her sentences.  Pt took MMSE 12-18 months ago and scored well.  Pt reports multiple family members w/ brain aneurysms and hemorrhagic strokes.  Now having HAs.  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  Vascular surgeon felt there was no way to salvage kidney.   Review of Systems For ROS see HPI     Objective:   Physical Exam  Vitals reviewed. Constitutional: She is oriented to person, place, and time. She appears well-developed and well-nourished. No distress.  HENT:  Head: Normocephalic and atraumatic.  Eyes: Conjunctivae and EOM are normal. Pupils are equal, round, and reactive to light.  Neck: Normal range of motion. Neck supple. No thyromegaly present.  Cardiovascular: Normal rate, regular rhythm, normal heart sounds and intact distal pulses.   No murmur heard. Pulmonary/Chest: Effort normal and breath sounds normal. No respiratory distress.  Abdominal: Soft. She exhibits no distension. There is no tenderness.  Musculoskeletal: She exhibits no edema.  Lymphadenopathy:    She has no cervical adenopathy.  Neurological: She is alert and oriented to person, place, and time.  Skin: Skin is warm and dry.  Psychiatric: She has a normal mood and affect. Her behavior is normal.          Assessment & Plan:

## 2013-03-01 NOTE — Progress Notes (Signed)
Pre visit review using our clinic review tool, if applicable. No additional management support is needed unless otherwise documented below in the visit note. 

## 2013-03-02 ENCOUNTER — Encounter: Payer: Self-pay | Admitting: General Practice

## 2013-03-02 LAB — BASIC METABOLIC PANEL
BUN: 14 mg/dL (ref 6–23)
CO2: 25 mEq/L (ref 19–32)
Calcium: 9.3 mg/dL (ref 8.4–10.5)
Creatinine, Ser: 1.1 mg/dL (ref 0.4–1.2)
GFR: 53.28 mL/min — ABNORMAL LOW (ref >60.00)
Glucose, Bld: 66 mg/dL — ABNORMAL LOW (ref 70–99)

## 2013-03-08 ENCOUNTER — Encounter: Payer: Self-pay | Admitting: Family Medicine

## 2013-03-08 NOTE — Assessment & Plan Note (Signed)
Refer to neuro given family hx of brain aneurysm and personal hx of aortic aneurysm.  Will follow.

## 2013-03-08 NOTE — Assessment & Plan Note (Signed)
Improved since taking meds daily.  Will follow.

## 2013-03-08 NOTE — Assessment & Plan Note (Signed)
New.  Check BMP to assess renal fxn.  Adjust meds based on renal fxn.

## 2013-03-08 NOTE — Assessment & Plan Note (Signed)
New.  Suspect this is due to pt's ongoing depression but given vascular issues will refer to neuro.  Will follow.

## 2013-03-08 NOTE — Assessment & Plan Note (Signed)
New.  Given hx of brain aneurysm in family, will refer to neuro

## 2013-03-14 ENCOUNTER — Encounter (HOSPITAL_COMMUNITY): Payer: Self-pay | Admitting: Emergency Medicine

## 2013-03-14 ENCOUNTER — Emergency Department (HOSPITAL_COMMUNITY)
Admission: EM | Admit: 2013-03-14 | Discharge: 2013-03-14 | Discharge status: Against Medical Advice | Payer: Medicare Other | Attending: Emergency Medicine | Admitting: Emergency Medicine

## 2013-03-14 DIAGNOSIS — R630 Anorexia: Secondary | ICD-10-CM | POA: Insufficient documentation

## 2013-03-14 DIAGNOSIS — J4489 Other specified chronic obstructive pulmonary disease: Secondary | ICD-10-CM | POA: Insufficient documentation

## 2013-03-14 DIAGNOSIS — R51 Headache: Secondary | ICD-10-CM | POA: Insufficient documentation

## 2013-03-14 DIAGNOSIS — J449 Chronic obstructive pulmonary disease, unspecified: Secondary | ICD-10-CM | POA: Insufficient documentation

## 2013-03-14 DIAGNOSIS — Z87891 Personal history of nicotine dependence: Secondary | ICD-10-CM | POA: Insufficient documentation

## 2013-03-14 DIAGNOSIS — J45909 Unspecified asthma, uncomplicated: Secondary | ICD-10-CM | POA: Insufficient documentation

## 2013-03-14 DIAGNOSIS — R413 Other amnesia: Secondary | ICD-10-CM | POA: Insufficient documentation

## 2013-03-14 NOTE — ED Notes (Signed)
Pt and family to desk; pt states she is leaving; husband states she can take better care of pt at home; advised to return at any time

## 2013-03-14 NOTE — ED Notes (Signed)
Pt c/o memory "problems" x 1 month, loss of appetite x 2 months, and headache x 2 weeks.  Pain score 3/10.  Pt has been seen by PCP for same complaint and told that it is all related to stress.  Pt's husband sts that they have been through "a lot of medical and family-related stress."  Pt sts she has an appointment w/ a neurologist in January.  A & Ox4, but there is a delay in response.  Pt's husband sts the delayed responses have been going on "for over a month."

## 2013-03-15 ENCOUNTER — Encounter (HOSPITAL_BASED_OUTPATIENT_CLINIC_OR_DEPARTMENT_OTHER): Payer: Self-pay | Admitting: Emergency Medicine

## 2013-03-15 ENCOUNTER — Emergency Department (HOSPITAL_BASED_OUTPATIENT_CLINIC_OR_DEPARTMENT_OTHER)
Admission: EM | Admit: 2013-03-15 | Discharge: 2013-03-15 | Disposition: A | Discharge status: Home / Self Care | Payer: Medicare Other | Attending: Emergency Medicine | Admitting: Emergency Medicine

## 2013-03-15 ENCOUNTER — Emergency Department (HOSPITAL_BASED_OUTPATIENT_CLINIC_OR_DEPARTMENT_OTHER): Payer: Medicare Other

## 2013-03-15 DIAGNOSIS — F3289 Other specified depressive episodes: Secondary | ICD-10-CM | POA: Insufficient documentation

## 2013-03-15 DIAGNOSIS — Z79899 Other long term (current) drug therapy: Secondary | ICD-10-CM | POA: Insufficient documentation

## 2013-03-15 DIAGNOSIS — Z87891 Personal history of nicotine dependence: Secondary | ICD-10-CM | POA: Insufficient documentation

## 2013-03-15 DIAGNOSIS — J449 Chronic obstructive pulmonary disease, unspecified: Secondary | ICD-10-CM | POA: Insufficient documentation

## 2013-03-15 DIAGNOSIS — E039 Hypothyroidism, unspecified: Secondary | ICD-10-CM | POA: Insufficient documentation

## 2013-03-15 DIAGNOSIS — IMO0002 Reserved for concepts with insufficient information to code with codable children: Secondary | ICD-10-CM | POA: Insufficient documentation

## 2013-03-15 DIAGNOSIS — E785 Hyperlipidemia, unspecified: Secondary | ICD-10-CM | POA: Insufficient documentation

## 2013-03-15 DIAGNOSIS — R413 Other amnesia: Secondary | ICD-10-CM | POA: Insufficient documentation

## 2013-03-15 DIAGNOSIS — K219 Gastro-esophageal reflux disease without esophagitis: Secondary | ICD-10-CM | POA: Insufficient documentation

## 2013-03-15 DIAGNOSIS — Z8701 Personal history of pneumonia (recurrent): Secondary | ICD-10-CM | POA: Insufficient documentation

## 2013-03-15 DIAGNOSIS — Z7982 Long term (current) use of aspirin: Secondary | ICD-10-CM | POA: Insufficient documentation

## 2013-03-15 DIAGNOSIS — R350 Frequency of micturition: Secondary | ICD-10-CM | POA: Insufficient documentation

## 2013-03-15 DIAGNOSIS — M129 Arthropathy, unspecified: Secondary | ICD-10-CM | POA: Insufficient documentation

## 2013-03-15 DIAGNOSIS — J4489 Other specified chronic obstructive pulmonary disease: Secondary | ICD-10-CM | POA: Insufficient documentation

## 2013-03-15 DIAGNOSIS — F329 Major depressive disorder, single episode, unspecified: Secondary | ICD-10-CM | POA: Insufficient documentation

## 2013-03-15 DIAGNOSIS — J069 Acute upper respiratory infection, unspecified: Secondary | ICD-10-CM | POA: Insufficient documentation

## 2013-03-15 DIAGNOSIS — Z8619 Personal history of other infectious and parasitic diseases: Secondary | ICD-10-CM | POA: Insufficient documentation

## 2013-03-15 LAB — URINE MICROSCOPIC-ADD ON

## 2013-03-15 LAB — BASIC METABOLIC PANEL
CO2: 23 mEq/L (ref 19–32)
Calcium: 9.8 mg/dL (ref 8.4–10.5)
GFR calc Af Amer: 59 mL/min — ABNORMAL LOW (ref >90)
GFR calc non Af Amer: 51 mL/min — ABNORMAL LOW (ref >90)
Potassium: 3 mEq/L — ABNORMAL LOW (ref 3.7–5.3)
Sodium: 135 mEq/L — ABNORMAL LOW (ref 137–147)

## 2013-03-15 LAB — URINALYSIS, ROUTINE W REFLEX MICROSCOPIC
Glucose, UA: NEGATIVE mg/dL
Ketones, ur: NEGATIVE mg/dL
Nitrite: NEGATIVE
Protein, ur: NEGATIVE mg/dL
Specific Gravity, Urine: 1.01 (ref 1.005–1.030)
Urobilinogen, UA: 0.2 mg/dL (ref 0.0–1.0)

## 2013-03-15 LAB — TROPONIN I: Troponin I: <0.3 ng/mL (ref <0.30)

## 2013-03-15 LAB — CBC WITH DIFFERENTIAL/PLATELET
Basophils Absolute: 0 10*3/uL (ref 0.0–0.1)
Basophils Relative: 0 % (ref 0–1)
Eosinophils Absolute: 0 10*3/uL (ref 0.0–0.7)
Eosinophils Relative: 1 % (ref 0–5)
MCH: 30.1 pg (ref 26.0–34.0)
MCHC: 34.6 g/dL (ref 30.0–36.0)
Neutrophils Relative %: 58 % (ref 43–77)
Platelets: 235 10*3/uL (ref 150–400)
RBC: 4.82 MIL/uL (ref 3.87–5.11)
RDW: 12.8 % (ref 11.5–15.5)

## 2013-03-15 MED ORDER — SODIUM CHLORIDE 0.9 % IV BOLUS (SEPSIS)
1000.0000 mL | Freq: Once | INTRAVENOUS | Status: AC
  Administered 2013-03-15: 1000 mL via INTRAVENOUS

## 2013-03-15 NOTE — ED Provider Notes (Signed)
CSN: [id number]     Arrival date & time 03/15/13  0904 History   First MD Initiated Contact with Patient 03/15/13 0920     Chief Complaint  Patient presents with  . Memory Loss  . Headache  . Cough   (Consider location/radiation/quality/duration/timing/severity/associated sxs/prior Treatment) Patient is a 67 y.o. female presenting with headaches and cough.  Headache Associated symptoms: cough   Cough Associated symptoms: headaches    PT has three complaints, chief of which is headache, diffuse, intermittent throbbing ongoing for about 2 weeks, not associated with blurry vision, numbness or tingling but she has felt off balance at times. She has had a cough and URI symptoms over a similar time period, but denies any fever, vomiting or diarrhea. She does report frequent urination. She also reports about 6 months of gradually worsening, but waxing and waning memory loss. No abrupt change recently. Husband reports last night could not remember the year, but today is back to normal. She has seen PCP for same, scheduled to see Neurology in about 3 weeks.   Past Medical History  Diagnosis Date  . Arthritis   . Allergic rhinitis   . Asthma   . Depression   . GERD (gastroesophageal reflux disease)   . Hyperlipidemia   . Urinary incontinence   . History of chicken pox   . COPD (chronic obstructive pulmonary disease)   . Pneumonia   . Chest pain     [facility]  . Hypothyroid    Past Surgical History  Procedure Laterality Date  . Tubal ligation    . Tonsillectomy    . Knee surgery      right  . Endovascular stent insertion N/A 01/11/2013    Procedure: ENDOVASCULAR STENT GRAFT INSERTION; ULTRASOUND GUIDED, Renal Stent left renal artery , open left brachial artery closure.;  Surgeon: Nada Libman, MD;  Location: [facility] OR;  Service: Vascular;  Laterality: N/A;  . Abdominal aortic aneurysm repair     Family History  Problem Relation Age of Onset  . Coronary artery disease Mother    . Stroke Mother   . Hypertension Neg Hx   . Diabetes Neg Hx   . Cancer Neg Hx    History  Substance Use Topics  . Smoking status: Former Smoker    Start date: 01/11/2013  . Smokeless tobacco: Not on file  . Alcohol Use: No   OB History   Grav Para Term Preterm Abortions TAB SAB Ect Mult Living   3 3             Review of Systems  Respiratory: Positive for cough.   Neurological: Positive for headaches.   All other systems reviewed and are negative except as noted in HPI.   Allergies  Review of patient's allergies indicates no known allergies.  Home Medications   Current Outpatient Rx  Name  Route  Sig  Dispense  Refill  . albuterol (PROVENTIL HFA;VENTOLIN HFA) 108 (90 BASE) MCG/ACT inhaler   Inhalation   Inhale 2 puffs into the lungs every 6 (six) hours as needed for wheezing.         Marland Kitchen aspirin EC 81 MG tablet   Oral   Take 81 mg by mouth daily.         . citalopram (CELEXA) 20 MG tablet   Oral   Take 1 tablet (20 mg total) by mouth daily.   30 tablet   6   . guaifenesin (HUMIBID E) 400 MG TABS tablet  Oral   Take 400 mg by mouth every 4 (four) hours as needed (for congestion).         . mometasone (ASMANEX) 220 MCG/INH inhaler   Inhalation   Inhale 2 puffs into the lungs daily.         Marland Kitchen atorvastatin (LIPITOR) 40 MG tablet   Oral   Take 1 tablet (40 mg total) by mouth daily.   90 tablet   3   . fenofibrate 160 MG tablet   Oral   Take 160 mg by mouth daily.         Marland Kitchen levothyroxine (SYNTHROID, LEVOTHROID) 75 MCG tablet   Oral   Take 75 mcg by mouth daily before breakfast.         . omeprazole (PRILOSEC) 20 MG capsule      TAKE ONE CAPSULE BY MOUTH EVERY DAY   90 capsule   1   . polyethylene glycol (MIRALAX / GLYCOLAX) packet   Oral   Take 17 g by mouth 2 (two) times daily as needed (for constipation).         . Polyethylene Glycol 400 0.25 % SOLN   Ophthalmic   Apply 2 drops to eye as needed.         . simvastatin  (ZOCOR) 40 MG tablet               . tiotropium (SPIRIVA) 18 MCG inhalation capsule   Inhalation   Place 18 mcg into inhaler and inhale daily.         . traZODone (DESYREL) 50 MG tablet   Oral   Take 0.5-1 tablets (25-50 mg total) by mouth at bedtime as needed for sleep.   30 tablet   3    BP 154/74  Pulse 94  Temp(Src) 97.9 F (36.6 C) (Oral)  Resp 18  SpO2 95% Physical Exam  Nursing note and vitals reviewed. Constitutional: She is oriented to person, place, and time. She appears well-developed and well-nourished.  HENT:  Head: Normocephalic and atraumatic.  Eyes: EOM are normal. Pupils are equal, round, and reactive to light.  Neck: Normal range of motion. Neck supple.  Cardiovascular: Normal rate, normal heart sounds and intact distal pulses.   Pulmonary/Chest: Effort normal and breath sounds normal.  Abdominal: Bowel sounds are normal. She exhibits no distension. There is no tenderness.  Musculoskeletal: Normal range of motion. She exhibits no edema and no tenderness.  Neurological: She is alert and oriented to person, place, and time. She has normal strength. No cranial nerve deficit or sensory deficit. Coordination normal.  Skin: Skin is warm and dry. No rash noted.  Psychiatric: She has a normal mood and affect.    ED Course  Procedures (including critical care time) Labs Review Labs Reviewed  CBC WITH DIFFERENTIAL - Abnormal; Notable for the following:    Monocytes Relative 16 (*)    All other components within normal limits  BASIC METABOLIC PANEL - Abnormal; Notable for the following:    Sodium 135 (*)    Potassium 3.0 (*)    Glucose, Bld 122 (*)    GFR calc non Af Amer 51 (*)    GFR calc Af Amer 59 (*)    All other components within normal limits  URINALYSIS, ROUTINE W REFLEX MICROSCOPIC - Abnormal; Notable for the following:    Hgb urine dipstick SMALL (*)    All other components within normal limits  URINE MICROSCOPIC-ADD ON - Abnormal; Notable  for the following:  Bacteria, UA FEW (*)    Casts HYALINE CASTS (*)    All other components within normal limits  TROPONIN I   Imaging Review Dg Chest 2 View  03/15/2013   CLINICAL DATA:  Memory loss.  COPD.  EXAM: CHEST  2 VIEW  COMPARISON:  01/11/2013.  FINDINGS: Mediastinum and hilar structures are normal. Heart size normal. Pulmonary vascularity normal. No pleural effusion or pneumothorax. COPD cannot be excluded. No focal infiltrate. No acute osseous abnormality. Osteopenia. Aortic stent graft.  IMPRESSION: 1. COPD. 2. Abdominal aortic stent graft noted .   Electronically Signed   By: Maisie Fus  Register   On: 03/15/2013 10:25   Ct Head Wo Contrast  03/15/2013   CLINICAL DATA:  Headaches and memory loss. Previous abdominal aortic aneurysm surgery.  EXAM: CT HEAD WITHOUT CONTRAST  TECHNIQUE: Contiguous axial images were obtained from the base of the skull through the vertex without contrast.  COMPARISON:  None.  FINDINGS: No evidence for acute infarction, hemorrhage, mass lesion, hydrocephalus, or extra-axial fluid. Mild atrophy. Moderate hypoattenuation of the white matter, likely chronic microvascular ischemic change. Calvarium intact. No acute sinus or mastoid disease. Mild mucosal thickening left anterior ethmoid air cells. Negative appearing orbits.  IMPRESSION: No acute intracranial abnormality. Mild atrophy with moderate chronic microvascular ischemic change   Electronically Signed   By: Davonna Belling M.D.   On: 03/15/2013 10:26    EKG Interpretation    Date/Time:  Tuesday March 15 2013 09:52:36 EST Ventricular Rate:  83 PR Interval:  124 QRS Duration: 82 QT Interval:  434 QTC Calculation: 509 R Axis:   82 Text Interpretation:  Normal sinus rhythm ST \\T \ T wave abnormality, consider anterior ischemia Prolonged QT Abnormal ECG Since last tracing Nonspecific T wave abnormality NOW PRESENT Confirmed by SHELDON  MD, CHARLES ([id number]) on 03/15/2013 10:20:37 AM            MDM    1. Memory loss   2. Acute URI     Pt is awake and alert now, no significant memory loss on my exam. She is able to remember details of her recent illness as well as her past history. Husband states he has to finish her sentences sometimes when she is talking on the phone to her sister, but during my exam, other than occasional hesitation with responses, she is able to answer all questions appropriately. She has reported family history of cerebral aneurysm, will check labs and CT head. Also CXR for recent cough. Suspect her cough and headache are related to viral illness. Non-specific memory loss unlikely to be related to an acute process. Pt and husband informed that neurology follow up would still be necessary absent a particular finding today.   11:50 AM Reviewed labs and imaging results with patient and husband including evidence of microvascular changes. Recommend Neuro followup. Return for any other concerns.    Charles B. Bernette Mayers, MD 03/15/13 [phone]

## 2013-03-15 NOTE — ED Notes (Signed)
Patient transported to CT 

## 2013-03-15 NOTE — ED Notes (Signed)
Pt reports a headache that started yesterday and she was in bed all day.  Pt went to [facility] last night around 6:30, was triaged, stated they waited 3 hours and LWBS.  Spouse is concerned about memory and states she could not recall the year yesterday. Pt has had memory problems x 6 months, however pt and states they feel this is not age related.

## 2013-03-17 ENCOUNTER — Other Ambulatory Visit: Payer: Self-pay | Admitting: Family Medicine

## 2013-03-18 NOTE — Telephone Encounter (Signed)
Med filled.  

## 2013-04-06 ENCOUNTER — Encounter: Payer: Self-pay | Admitting: Neurology

## 2013-04-06 ENCOUNTER — Ambulatory Visit (INDEPENDENT_AMBULATORY_CARE_PROVIDER_SITE_OTHER): Payer: Medicare HMO | Admitting: Neurology

## 2013-04-06 ENCOUNTER — Telehealth: Payer: Self-pay

## 2013-04-06 VITALS — BP 122/68 | HR 70 | Temp 97.5°F | Resp 18 | Ht 67.0 in | Wt 134.0 lb

## 2013-04-06 DIAGNOSIS — R413 Other amnesia: Secondary | ICD-10-CM

## 2013-04-06 DIAGNOSIS — Z823 Family history of stroke: Secondary | ICD-10-CM

## 2013-04-06 DIAGNOSIS — F32A Depression, unspecified: Secondary | ICD-10-CM

## 2013-04-06 DIAGNOSIS — F3289 Other specified depressive episodes: Secondary | ICD-10-CM

## 2013-04-06 DIAGNOSIS — F329 Major depressive disorder, single episode, unspecified: Secondary | ICD-10-CM

## 2013-04-06 DIAGNOSIS — Z8249 Family history of ischemic heart disease and other diseases of the circulatory system: Secondary | ICD-10-CM

## 2013-04-06 DIAGNOSIS — R51 Headache: Secondary | ICD-10-CM

## 2013-04-06 LAB — VITAMIN B12: Vitamin B-12: 296 pg/mL (ref 211–911)

## 2013-04-06 NOTE — Progress Notes (Addendum)
NEUROLOGY CONSULTATION NOTE  Amy Bates MRN: [id number] DOB: 07/20/1945  Referring provider: Dr. Beverely Low Primary care provider: Dr. Beverely Low  Reason for consult:  Confusion and headache  HISTORY OF PRESENT ILLNESS: Amy Bates is a 68 year old right-handed woman with history of COPD, hypothyroidism, hyperlipidemia, depression, renal artery occlusion, and abdominal aortic aneurysm who presents for headache and memory loss.  She is accompanied by her husband.  Records and images were personally reviewed where available.  She was found to have a 5.4 x 4.8cm unruptured infrarenal abdominal aortic aneurysm after experiencing three weeks of abdominal pain.  She underwent endovascular repair and stenting on 01/11/13.  Since that time, she and her husband have noted periods of confusion.  She initially began having word-finding difficulties where she would lose track of thought and would fumble for the words.  She understands others and is able to read without difficulty.  This was intermittent but becoming more frequent.  She also began experiencing bi-frontal and retro-orbital headaches, described as pounding, about 6/10 (not worse headache of her life) and not associated with nausea, vomiting, photophobia, phonophobia, or visual disturbance.  She denies neck or shoulder pain.  It would occur about 3 days a week and would last a couple of hours.  Onset seems to occur later in the day.  It is not positional.  She treats it with acetaminophen, which helps.  She denies vision problems.  Whenever she has word-finding difficulties, her husband will start asking her questions to test her.  She presented to the ED on 03/15/13 because she was not able to tell her husband the year or her daughters' names.  CT Head revealed chronic small vessel ischemic changes, but no evidence of acute infarct, mass lesion or hemorrhage.  She was diagnosed with an upper respiratory infection. She has a history of depression, but  she has had significant increase in depression since the surgery.  She is very tearful and cries easily.  She does not sleep well.  She is fatigued.  She has reduced appetite and now drinks Ensure for supplementation.  She and her husband have been under a great deal of stress over the past year.  His mother died and there are conflicts in executing her will.  This has caused both financial stress as well as strain on their marriage.  She is also fearful that she may have Alzheimer's disease as her father had this.  She was taking antidepressant but stopped on her own.  She has no history of headache.  Family history is significant for father with Alzheimer's disease and aunt with cerebral aneurysm.   PAST MEDICAL HISTORY: Past Medical History  Diagnosis Date  . Arthritis   . Allergic rhinitis   . Asthma   . Depression   . GERD (gastroesophageal reflux disease)   . Hyperlipidemia   . Urinary incontinence   . History of chicken pox   . COPD (chronic obstructive pulmonary disease)   . Pneumonia   . Chest pain     [facility]  . Hypothyroid     PAST SURGICAL HISTORY: Past Surgical History  Procedure Laterality Date  . Tubal ligation    . Tonsillectomy    . Knee surgery      right  . Endovascular stent insertion N/A 01/11/2013    Procedure: ENDOVASCULAR STENT GRAFT INSERTION; ULTRASOUND GUIDED, Renal Stent left renal artery , open left brachial artery closure.;  Surgeon: Nada Libman, MD;  Location: [facility] OR;  Service:  Vascular;  Laterality: N/A;  . Abdominal aortic aneurysm repair      MEDICATIONS: Current Outpatient Prescriptions on File Prior to Visit  Medication Sig Dispense Refill  . albuterol (PROVENTIL HFA;VENTOLIN HFA) 108 (90 BASE) MCG/ACT inhaler Inhale 2 puffs into the lungs every 6 (six) hours as needed for wheezing.      Marland Kitchen aspirin EC 81 MG tablet Take 81 mg by mouth daily.      Marland Kitchen atorvastatin (LIPITOR) 40 MG tablet Take 1 tablet (40 mg total) by mouth daily.  90  tablet  3  . citalopram (CELEXA) 20 MG tablet Take 1 tablet (20 mg total) by mouth daily.  30 tablet  6  . fenofibrate 160 MG tablet Take 160 mg by mouth daily.      . fenofibrate 160 MG tablet TAKE 1 TABLET BY MOUTH EVERY DAY  30 tablet  6  . guaifenesin (HUMIBID E) 400 MG TABS tablet Take 400 mg by mouth every 4 (four) hours as needed (for congestion).      Marland Kitchen levothyroxine (SYNTHROID, LEVOTHROID) 75 MCG tablet Take 75 mcg by mouth daily before breakfast.      . levothyroxine (SYNTHROID, LEVOTHROID) 75 MCG tablet TAKE 1 TABLET BY MOUTH EVERY DAY  30 tablet  6  . mometasone (ASMANEX) 220 MCG/INH inhaler Inhale 2 puffs into the lungs daily.      Marland Kitchen omeprazole (PRILOSEC) 20 MG capsule TAKE ONE CAPSULE BY MOUTH EVERY DAY  90 capsule  1  . polyethylene glycol (MIRALAX / GLYCOLAX) packet Take 17 g by mouth 2 (two) times daily as needed (for constipation).      . Polyethylene Glycol 400 0.25 % SOLN Apply 2 drops to eye as needed.      . simvastatin (ZOCOR) 40 MG tablet       . tiotropium (SPIRIVA) 18 MCG inhalation capsule Place 18 mcg into inhaler and inhale daily.      . traZODone (DESYREL) 50 MG tablet Take 0.5-1 tablets (25-50 mg total) by mouth at bedtime as needed for sleep.  30 tablet  3   No current facility-administered medications on file prior to visit.    ALLERGIES: No Known Allergies  FAMILY HISTORY: Family History  Problem Relation Age of Onset  . Coronary artery disease Mother   . Stroke Mother   . Hypertension Neg Hx   . Diabetes Neg Hx   . Cancer Neg Hx     SOCIAL HISTORY: History   Social History  . Marital Status: Married    Spouse Name: N/A    Number of Children: 3  . Years of Education: N/A   Occupational History  . Retired Leisure centre manager    Social History Main Topics  . Smoking status: Former Smoker    Start date: 01/11/2013  . Smokeless tobacco: Not on file  . Alcohol Use: No  . Drug Use: No  . Sexual Activity: Not on file   Other Topics Concern  . Not  on file   Social History Narrative   Lives with mother-in-law.    REVIEW OF SYSTEMS: Constitutional: Fatigue, weight loss Eyes: No visual changes, double vision, eye pain Ear, nose and throat: No hearing loss, ear pain, nasal congestion, sore throat Cardiovascular: Chest discomfort Respiratory:  Some shortness of breath GastrointestinaI: Diarrhea Genitourinary:  No dysuria, urinary retention or frequency Musculoskeletal:  No neck pain, back pain Integumentary: Rash, pruritus  Neurological: as above Psychiatric: No depression, insomnia, anxiety Endocrine: No palpitations, fatigue, diaphoresis, mood swings, change in  appetite, change in weight, increased thirst Hematologic/Lymphatic:  No anemia, purpura, petechiae. Allergic/Immunologic: no itchy/runny eyes, nasal congestion, recent allergic reactions, rashes  PHYSICAL EXAM: Filed Vitals:   04/06/13 0756  BP: 122/68  Pulse: 70  Temp: 97.5 F (36.4 C)  Resp: 18   General: No acute distress Head:  Normocephalic/atraumatic Neck: supple, no paraspinal tenderness, full range of motion Back: No paraspinal tenderness Heart: regular rate and rhythm Lungs: Clear to auscultation bilaterally. Vascular: No carotid bruits. Neurological Exam: Mental status: alert and oriented to person, place, and time, speech fluent and not dysarthric, able to name, repeat, read, write and follow 3 step commands across midline.  Able to spell WORLD backwards.  Delayed recall 1 of 3 words.  Able to draw intersecting pentagons correctly.  Able to draw clock and numbers correctly, but struggled when asked to place hands at "10 past 11".  MMSE 28/30 Cranial nerves: CN I: not tested CN II: pupils equal, round and reactive to light, visual fields intact, fundi not visualized. CN III, IV, VI:  full range of motion, no nystagmus, no ptosis CN V: facial sensation intact CN VII: upper and lower face symmetric CN VIII: hearing intact CN IX, X: gag intact, uvula  midline CN XI: sternocleidomastoid and trapezius muscles intact CN XII: tongue midline Bulk & Tone: normal, no fasciculations. Motor: 5/5 throughout Sensation: endorses inconsistent reduced pinprick in left hand and forearm.  Vibration intact. Deep Tendon Reflexes: 2+ throughout, toes down Finger to nose testing: normal Gait: flexed posture but normal arm-swing, stance and stride.  Some difficulty with tandem walking. Romberg negative.  IMPRESSION: 1.  Memory problems 2.  Headache  I suspect that both are related to stress and depression following her surgery.  Her cognitive exam is fairly unremarkable.  I really don't suspect cerebral aneurysm as cause of headache either, but with history of AAA, family history of cerebral aneurysm (although not first degree relative) and since these are new, we will check CTA (cannot have MRI due to metal in her jaw).  PLAN: 1.  Check B12 2.  CTA of head 3.  Address depression and sleep problems with Dr. Beverely Lowabori. 4.  Follow up in 6 months for re-evaluation.  60 minutes spent with patient, over 50% spent counseling and coordinating care.  Thank you for allowing me to take part in the care of this patient.  Shon MilletAdam Berlinda Farve, DO  CC:  Neena RhymesKatherine Tabori, MD

## 2013-04-06 NOTE — Patient Instructions (Addendum)
I do think that depression is playing a central role in all this. 1.  We will check vitamin B12 level as this can affect memory. 2.  We will check cat scan of arteries of head- January 23@ 8am [phone] 3.  Please talk with Dr. Beverely Lowabori about addressing depression and sleep problems and possible counseling in addition to antidepressant medication. 4.  Follow up for re-evaluation in 6 months. 5.  May continue acetaminophen for headache, but do not exceed more than 2 days out of week.

## 2013-04-06 NOTE — Telephone Encounter (Signed)
Call from Neuro and they are requesting a referral to be faxed due to the patient having [employer]. Referral is in the system will forward to [place].

## 2013-04-07 NOTE — Telephone Encounter (Signed)
Referral faxed to [employer]. Awaiting authorization. °

## 2013-04-08 ENCOUNTER — Encounter (HOSPITAL_COMMUNITY): Payer: Self-pay

## 2013-04-08 ENCOUNTER — Ambulatory Visit (HOSPITAL_COMMUNITY)
Admission: RE | Admit: 2013-04-08 | Discharge: 2013-04-08 | Disposition: A | Discharge status: Home / Self Care | Payer: Medicare HMO | Source: Ambulatory Visit | Attending: Neurology | Admitting: Neurology

## 2013-04-08 DIAGNOSIS — R51 Headache: Secondary | ICD-10-CM | POA: Diagnosis not present

## 2013-04-08 DIAGNOSIS — I6789 Other cerebrovascular disease: Secondary | ICD-10-CM | POA: Insufficient documentation

## 2013-04-08 DIAGNOSIS — I671 Cerebral aneurysm, nonruptured: Secondary | ICD-10-CM | POA: Insufficient documentation

## 2013-04-08 DIAGNOSIS — F29 Unspecified psychosis not due to a substance or known physiological condition: Secondary | ICD-10-CM | POA: Insufficient documentation

## 2013-04-08 MED ORDER — IOHEXOL 350 MG/ML SOLN
50.0000 mL | Freq: Once | INTRAVENOUS | Status: AC | PRN
  Administered 2013-04-08: 50 mL via INTRAVENOUS

## 2013-04-12 ENCOUNTER — Telehealth: Payer: Self-pay | Admitting: *Deleted

## 2013-04-12 NOTE — Telephone Encounter (Signed)
Message copied by Fredirick MaudlinVAN DER GLAS, Vang Kraeger E on Tue Apr 12, 2013 11:21 AM ------      Message from: JAFFE, ADAM R      Created: Fri Apr 08, 2013 12:22 PM       Please let Ms. Malen GauzeFoster know that the CTA does show a small aneurysm (3mm).  It is very small and an incidental finding.  It is not contributing to headache.  Usually aneurysms this size have virtually no risk of rupture over 5 year.  I would probably repeat the CTA in one year for routine follow up.            ARJ      ----- Message -----         From: Rad Results In Interface         Sent: 04/08/2013  11:53 AM           To: Cira ServantAdam Robert Jaffe, DO                   ------

## 2013-04-13 ENCOUNTER — Encounter: Payer: Self-pay | Admitting: *Deleted

## 2013-04-13 ENCOUNTER — Telehealth: Payer: Self-pay | Admitting: *Deleted

## 2013-04-13 NOTE — Telephone Encounter (Signed)
Message copied by Fredirick MaudlinVAN DER GLAS, Latonyia Lopata E on Wed Apr 13, 2013  8:18 AM ------      Message from: JAFFE, ADAM R      Created: Fri Apr 08, 2013 12:22 PM       Please let Ms. Malen GauzeFoster know that the CTA does show a small aneurysm (3mm).  It is very small and an incidental finding.  It is not contributing to headache.  Usually aneurysms this size have virtually no risk of rupture over 5 year.  I would probably repeat the CTA in one year for routine follow up.            ARJ      ----- Message -----         From: Rad Results In Interface         Sent: 04/08/2013  11:53 AM           To: Cira ServantAdam Robert Jaffe, DO                   ------

## 2013-04-13 NOTE — Telephone Encounter (Signed)
Called patient this am after several attempts to reach this patient with no luck I will mail letter to patient .

## 2013-04-22 ENCOUNTER — Ambulatory Visit: Payer: Medicare Other | Admitting: Family Medicine

## 2013-04-27 ENCOUNTER — Encounter: Payer: Self-pay | Admitting: Family Medicine

## 2013-04-27 ENCOUNTER — Ambulatory Visit (INDEPENDENT_AMBULATORY_CARE_PROVIDER_SITE_OTHER): Payer: Medicare HMO | Admitting: Family Medicine

## 2013-04-27 ENCOUNTER — Other Ambulatory Visit: Payer: Self-pay | Admitting: Family Medicine

## 2013-04-27 VITALS — BP 126/78 | HR 54 | Temp 98.1°F | Resp 16 | Wt 135.5 lb

## 2013-04-27 DIAGNOSIS — F329 Major depressive disorder, single episode, unspecified: Secondary | ICD-10-CM

## 2013-04-27 DIAGNOSIS — R413 Other amnesia: Secondary | ICD-10-CM

## 2013-04-27 DIAGNOSIS — F3289 Other specified depressive episodes: Secondary | ICD-10-CM

## 2013-04-27 MED ORDER — PANTOPRAZOLE SODIUM 40 MG PO TBEC: 40.0000 mg | DELAYED_RELEASE_TABLET | Freq: Every day | ORAL | Status: DC

## 2013-04-27 MED ORDER — ARIPIPRAZOLE 2 MG PO TABS: 2.0000 mg | ORAL_TABLET | Freq: Every day | ORAL | Status: DC

## 2013-04-27 MED ORDER — CITALOPRAM HYDROBROMIDE 40 MG PO TABS: 40.0000 mg | ORAL_TABLET | Freq: Every day | ORAL | Status: DC

## 2013-04-27 NOTE — Telephone Encounter (Signed)
Med filled.  

## 2013-04-27 NOTE — Progress Notes (Signed)
   Subjective:    Patient ID: Amy Bates, female    DOB: 02/22/1946, 68 y.o.   MRN: [id number]  HPI Depression- husband reports that pt cries 'all the time'.  She 'doesn't do anything'.  Not going out of the house, not engaging in activities.  'she goes from bed to the chair and back to the bed again'.  Pt admits that she's scared of 'everything' since her AAA repair.  Had recent neuro evaluation due to memory loss- neuro felt strongly it was due to severe dementia and not actual dementia.     Review of Systems For ROS see HPI     Objective:   Physical Exam  Vitals reviewed. Constitutional: She appears well-developed and well-nourished. She appears distressed (tearful, unable to speak).  Neurological: She is alert.  Psychiatric:  Tearful, anxious, unable to speak or answer questions for herself          Assessment & Plan:

## 2013-04-27 NOTE — Assessment & Plan Note (Signed)
Deteriorated.  sxs now severe.  Increase Celexa to 40mg  daily and add low dose Abilify to get pt out of her current depressed state.  Names and #s of psychiatrists given to assist in pt's need for ongoing tx.  Will follow.

## 2013-04-27 NOTE — Patient Instructions (Signed)
STOP the Omeprazole START the Protonix daily instead (won't interact w/ depression meds) INCREASE the Celexa to 2 tabs daily (40mg ), 1 of the new prescription ADD the Abilify daily- 1 tab daily Call and schedule with a psychiatrist GET OUT OF THE HOUSE!  DO SOMETHING! Call with any questions or concerns Hang in there!!!

## 2013-04-27 NOTE — Assessment & Plan Note (Signed)
Reviewed Neuro's note.  Pt's exam WNL.  Feeling is that pt's memory loss is due to severe depression.  Will attempt to treat depression and monitor for improvement.

## 2013-04-28 ENCOUNTER — Encounter: Payer: Self-pay | Admitting: Family Medicine

## 2013-05-09 ENCOUNTER — Telehealth: Payer: Self-pay | Admitting: *Deleted

## 2013-05-09 NOTE — Telephone Encounter (Signed)
Received paperwork for handicap placard application. Billing sheet attached, form filled out as much as possible, and [facility]ly Lowabori. JG//CMA

## 2013-05-16 NOTE — Telephone Encounter (Signed)
Form completed 05/11/13 and patient informed that it's ready for pick up. JG//CMA

## 2013-05-26 ENCOUNTER — Encounter (HOSPITAL_COMMUNITY): Payer: Self-pay | Admitting: Emergency Medicine

## 2013-05-26 ENCOUNTER — Emergency Department (HOSPITAL_COMMUNITY): Payer: Medicare HMO

## 2013-05-26 ENCOUNTER — Observation Stay (HOSPITAL_COMMUNITY)
Admission: EM | Admit: 2013-05-26 | Discharge: 2013-05-28 | Disposition: A | Discharge status: Home / Self Care | Payer: Medicare HMO | Attending: Internal Medicine | Admitting: Internal Medicine

## 2013-05-26 DIAGNOSIS — G4700 Insomnia, unspecified: Secondary | ICD-10-CM

## 2013-05-26 DIAGNOSIS — R4182 Altered mental status, unspecified: Secondary | ICD-10-CM

## 2013-05-26 DIAGNOSIS — R51 Headache: Secondary | ICD-10-CM

## 2013-05-26 DIAGNOSIS — F329 Major depressive disorder, single episode, unspecified: Secondary | ICD-10-CM

## 2013-05-26 DIAGNOSIS — J441 Chronic obstructive pulmonary disease with (acute) exacerbation: Secondary | ICD-10-CM

## 2013-05-26 DIAGNOSIS — Z8249 Family history of ischemic heart disease and other diseases of the circulatory system: Secondary | ICD-10-CM

## 2013-05-26 DIAGNOSIS — F3289 Other specified depressive episodes: Secondary | ICD-10-CM

## 2013-05-26 DIAGNOSIS — E785 Hyperlipidemia, unspecified: Secondary | ICD-10-CM

## 2013-05-26 DIAGNOSIS — E039 Hypothyroidism, unspecified: Secondary | ICD-10-CM

## 2013-05-26 DIAGNOSIS — R569 Unspecified convulsions: Secondary | ICD-10-CM

## 2013-05-26 DIAGNOSIS — W19XXXA Unspecified fall, initial encounter: Secondary | ICD-10-CM

## 2013-05-26 DIAGNOSIS — K219 Gastro-esophageal reflux disease without esophagitis: Secondary | ICD-10-CM

## 2013-05-26 DIAGNOSIS — J309 Allergic rhinitis, unspecified: Secondary | ICD-10-CM

## 2013-05-26 DIAGNOSIS — R634 Abnormal weight loss: Secondary | ICD-10-CM

## 2013-05-26 DIAGNOSIS — J4489 Other specified chronic obstructive pulmonary disease: Secondary | ICD-10-CM

## 2013-05-26 DIAGNOSIS — J449 Chronic obstructive pulmonary disease, unspecified: Secondary | ICD-10-CM | POA: Insufficient documentation

## 2013-05-26 DIAGNOSIS — R1084 Generalized abdominal pain: Secondary | ICD-10-CM

## 2013-05-26 DIAGNOSIS — J40 Bronchitis, not specified as acute or chronic: Secondary | ICD-10-CM

## 2013-05-26 DIAGNOSIS — R413 Other amnesia: Principal | ICD-10-CM

## 2013-05-26 DIAGNOSIS — R32 Unspecified urinary incontinence: Secondary | ICD-10-CM

## 2013-05-26 DIAGNOSIS — Z87891 Personal history of nicotine dependence: Secondary | ICD-10-CM | POA: Insufficient documentation

## 2013-05-26 DIAGNOSIS — I6783 Posterior reversible encephalopathy syndrome: Secondary | ICD-10-CM

## 2013-05-26 DIAGNOSIS — F17200 Nicotine dependence, unspecified, uncomplicated: Secondary | ICD-10-CM

## 2013-05-26 DIAGNOSIS — Y92009 Unspecified place in unspecified non-institutional (private) residence as the place of occurrence of the external cause: Secondary | ICD-10-CM

## 2013-05-26 DIAGNOSIS — I714 Abdominal aortic aneurysm, without rupture, unspecified: Secondary | ICD-10-CM

## 2013-05-26 DIAGNOSIS — M47812 Spondylosis without myelopathy or radiculopathy, cervical region: Secondary | ICD-10-CM | POA: Insufficient documentation

## 2013-05-26 DIAGNOSIS — J45909 Unspecified asthma, uncomplicated: Secondary | ICD-10-CM

## 2013-05-26 DIAGNOSIS — N280 Ischemia and infarction of kidney: Secondary | ICD-10-CM

## 2013-05-26 DIAGNOSIS — Z0000 Encounter for general adult medical examination without abnormal findings: Secondary | ICD-10-CM

## 2013-05-26 DIAGNOSIS — R296 Repeated falls: Secondary | ICD-10-CM

## 2013-05-26 LAB — CBC WITH DIFFERENTIAL/PLATELET
Basophils Absolute: 0 10*3/uL (ref 0.0–0.1)
Basophils Relative: 0 % (ref 0–1)
EOS ABS: 0 10*3/uL (ref 0.0–0.7)
EOS PCT: 0 % (ref 0–5)
HCT: 39.2 % (ref 36.0–46.0)
Hemoglobin: 13.8 g/dL (ref 12.0–15.0)
LYMPHS PCT: 11 % — AB (ref 12–46)
Lymphs Abs: 1.3 10*3/uL (ref 0.7–4.0)
MCH: 30.6 pg (ref 26.0–34.0)
MCHC: 35.2 g/dL (ref 30.0–36.0)
MCV: 86.9 fL (ref 78.0–100.0)
Monocytes Absolute: 0.4 10*3/uL (ref 0.1–1.0)
Monocytes Relative: 4 % (ref 3–12)
Neutro Abs: 9.7 10*3/uL — ABNORMAL HIGH (ref 1.7–7.7)
Neutrophils Relative %: 84 % — ABNORMAL HIGH (ref 43–77)
PLATELETS: 251 10*3/uL (ref 150–400)
RBC: 4.51 MIL/uL (ref 3.87–5.11)
RDW: 13 % (ref 11.5–15.5)
WBC: 11.5 10*3/uL — ABNORMAL HIGH (ref 4.0–10.5)

## 2013-05-26 LAB — URINALYSIS, ROUTINE W REFLEX MICROSCOPIC
Bilirubin Urine: NEGATIVE
Glucose, UA: NEGATIVE mg/dL
Hgb urine dipstick: NEGATIVE
Ketones, ur: NEGATIVE mg/dL
Leukocytes, UA: NEGATIVE
NITRITE: NEGATIVE
Protein, ur: NEGATIVE mg/dL
SPECIFIC GRAVITY, URINE: 1.016 (ref 1.005–1.030)
Urobilinogen, UA: 1 mg/dL (ref 0.0–1.0)
pH: 7.5 (ref 5.0–8.0)

## 2013-05-26 LAB — CBG MONITORING, ED: Glucose-Capillary: 130 mg/dL — ABNORMAL HIGH (ref 70–99)

## 2013-05-26 MED ORDER — ONDANSETRON HCL 4 MG/2ML IJ SOLN
4.0000 mg | Freq: Once | INTRAMUSCULAR | Status: AC
  Administered 2013-05-26: 4 mg via INTRAVENOUS
  Filled 2013-05-26: qty 2

## 2013-05-26 MED ORDER — SODIUM CHLORIDE 0.9 % IV BOLUS (SEPSIS)
1000.0000 mL | Freq: Once | INTRAVENOUS | Status: AC
  Administered 2013-05-26: 1000 mL via INTRAVENOUS

## 2013-05-26 NOTE — ED Notes (Signed)
Patient transported to X-ray and CT 

## 2013-05-26 NOTE — ED Notes (Addendum)
Pt from home. Pt supposed to use a walker , but does not use it. Was walking up a hill and fell backwards and hit the back of her head. Pt is NOT on blood thinners. Family reports pt has had increased confusion and memory loss since her AAA surgery in January. Pt alert to name, place, and event. PERLA and follows commands. Pt's son states that pt has had several CT scans of head and they have all been normal. Pt recently diagnosed with depression and that is what is causing her confusion and memory loss.

## 2013-05-26 NOTE — ED Provider Notes (Signed)
CSN: [id number]     Arrival date & time 05/26/13  1938 History   First MD Initiated Contact with Patient 05/26/13 1949     Chief Complaint  Patient presents with  . Fall     (Consider location/radiation/quality/duration/timing/severity/associated sxs/prior Treatment) The history is provided by the patient. No language interpreter was used.  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 As per daughter who was a witness to the fall, stated that the patient lost her balance and fell backwards hitting her head on the pavement. Stated that the patient was alert and oriented after the fall. Daughter reported that when she turned around to get her father she noticed that the patient was disorienting and mildly shaking. Stated that patient would not respond when name was called. Daughter reported that EMS was called and patient was brought to the ED. Husband reported that patient has not been acting normal for the past couple of weeks - stated that she has been more disoriented than normal. Stated that her mental status started to change back in October of 2014 after her AAA repair.  Husband reported that patient seems to have a short term memory loss, is unable to work the TV, and losing ability to remember to do things that she did on a daily basis. Husband reported that they have brought it to the attention of the patient's PCP who reported that this is due to depression or early stages of dementia. Patient stated that she is having a headache. Denied dizziness, blurred vision, visual distortions, fever, shortness of breath, difficulty breathing.  Past Medical History  Diagnosis Date  . Arthritis   . Allergic rhinitis   . Asthma   . Depression   . GERD (gastroesophageal reflux disease)   . Hyperlipidemia   . Urinary incontinence   . History of  chicken pox   . COPD (chronic obstructive pulmonary disease)   . Pneumonia   . Chest pain     [facility]  . Hypothyroid    Past Surgical History  Procedure Laterality Date  . Tubal ligation    . Tonsillectomy    . Knee surgery      right  . Endovascular stent insertion N/A 01/11/2013    Procedure: ENDOVASCULAR STENT GRAFT INSERTION; ULTRASOUND GUIDED, Renal Stent left renal artery , open left brachial artery closure.;  Surgeon: Nada Libman, MD;  Location: [facility] OR;  Service: Vascular;  Laterality: N/A;  . Abdominal aortic aneurysm repair     Family History  Problem Relation Age of Onset  . Coronary artery disease Mother   . Stroke Mother   . Hypertension Neg Hx   . Diabetes Neg Hx   . Cancer Neg Hx    History  Substance Use Topics  . Smoking status: Former Smoker    Start date: 01/11/2013  . Smokeless tobacco: Not on file  . Alcohol Use: No   OB History   Grav Para Term Preterm Abortions TAB SAB Ect Mult Living   3 3             Review of Systems  Unable to perform ROS: Acuity of condition  Constitutional: Negative for fever and chills.  Respiratory: Negative for shortness of breath.   Cardiovascular: Positive for chest pain.  Gastrointestinal: Negative for nausea, vomiting and abdominal pain.  Musculoskeletal: Positive for back pain (  Lower back) and neck pain.  Neurological: Positive for headaches. Negative for dizziness and weakness.  All other systems reviewed and are negative.      Allergies  Ibuprofen  Home Medications   Current Outpatient Rx  Name  Route  Sig  Dispense  Refill  . albuterol (PROVENTIL HFA;VENTOLIN HFA) 108 (90 BASE) MCG/ACT inhaler   Inhalation   Inhale 2 puffs into the lungs every 6 (six) hours as needed for wheezing.         . ARIPiprazole (ABILIFY) 2 MG tablet   Oral   Take 1 tablet (2 mg total) by mouth daily.   30 tablet   3   . atorvastatin (LIPITOR) 40 MG tablet   Oral   Take 1 tablet (40 mg total) by mouth  daily.   90 tablet   3   . buPROPion (WELLBUTRIN XL) 150 MG 24 hr tablet   Oral   Take 150 mg by mouth daily.         . fenofibrate 160 MG tablet   Oral   Take 160 mg by mouth daily.         Marland Kitchen. guaifenesin (HUMIBID E) 400 MG TABS tablet   Oral   Take 400 mg by mouth every 4 (four) hours as needed (for congestion).         Marland Kitchen. levothyroxine (SYNTHROID, LEVOTHROID) 75 MCG tablet   Oral   Take 75 mcg by mouth daily before breakfast.         . mometasone (ASMANEX) 220 MCG/INH inhaler   Inhalation   Inhale 2 puffs into the lungs daily.         . pantoprazole (PROTONIX) 40 MG tablet   Oral   Take 1 tablet (40 mg total) by mouth daily.   30 tablet   3   . polyethylene glycol (MIRALAX / GLYCOLAX) packet   Oral   Take 17 g by mouth 2 (two) times daily as needed (for constipation).         Marland Kitchen. tiotropium (SPIRIVA) 18 MCG inhalation capsule   Inhalation   Place 18 mcg into inhaler and inhale daily.          BP 153/71  Pulse 81  Temp(Src) 97.4 F (36.3 C) (Oral)  Resp 15  SpO2 100% Physical Exam  Nursing note and vitals reviewed. Constitutional: She appears well-developed and well-nourished. No distress.  Patient in c-collar and backboard  HENT:  Head: Normocephalic and atraumatic.  Mouth/Throat: No oropharyngeal exudate.  Dry mucous membranes  Eyes: Conjunctivae and EOM are normal. Pupils are equal, round, and reactive to light. Right eye exhibits no discharge. Left eye exhibits no discharge.  Negative nystagmus Visual fields grossly intact  Neck: No tracheal deviation present.  Patient placed in c-collar secondary to neck pain Discomfort upon palpation to C-spine  Cardiovascular: Normal rate, regular rhythm and normal heart sounds.  Exam reveals no friction rub.   No murmur heard. Pulses:      Radial pulses are 2+ on the right side, and 2+ on the left side.  Cap refill less than 3 seconds Negative swelling to lower extremities bilaterally, negative  pitting edema  Pulmonary/Chest: Effort normal and breath sounds normal. No respiratory distress. She has no wheezes. She has no rales. She exhibits no tenderness.  Patient is able to speak in full sentences without difficulty Negative use of accessory muscles Negative stridor Negative ecchymosis Negative crepitus  Abdominal: Soft. Bowel sounds are normal. There is no tenderness. There  is no guarding.  Musculoskeletal: She exhibits no tenderness.       Back:  Negative swelling, erythema, inflammation, lesions, sores, deformities, bulging noted to the cervical/thoracic/lumbosacral spine. Discomfort upon palpation to mid spinal region of lumbosacral spine. Negative pain upon palpation to the pelvic girdle or hips bilaterally. Negative pain upon palpation to the hips bilaterally. Negative crepitus upon motion. Full range of motion to hips. Full ROM to upper and lower extremities without difficulty noted, negative ataxia noted.  Neurological: She is alert. No cranial nerve deficit. She exhibits normal muscle tone. Coordination normal.  Oriented x 2 - baseline since 12/2012, baseline as per family.  Cranial nerves III-XII grossly intact Strength 5+/5+ to upper and lower extremities bilaterally with resistance applied, equal distribution noted Equal grip strength Sensation intact   Skin: Skin is warm and dry. No rash noted. She is not diaphoretic. No erythema.  Psychiatric: She has a normal mood and affect. Her behavior is normal. Thought content normal.    ED Course  Procedures (including critical care time)  10:49 PM This provider received a phone call from Dr. Dereck Ligas, physician reading patient's CT scan - reported that due to the patient's positioning and patient being placed in a c-collar difficult to say if there can be a true subdural, reported that it could be an artifact. Physician recommended repeat CT or MRI.  This provider spoke with Dr. Jenean Lindau who recommended that CT scan be  repeated in the morning, patient going to be admitted - stated that neurosurgery does not need to be informed at this time.   12:58 AM This provider spoke with Dr. Vania Rea, Hospitalist - discussed case, history, presentation, labs, imaging in great detail. Discussed plan for repeat CT scan to be performed tomorrow morning. Patient to be admitted to MedSurg as observation as per recommendation of admitting physician.   Results for orders placed during the hospital encounter of 05/26/13  CBC WITH DIFFERENTIAL      Result Value Ref Range   WBC 11.5 (*) 4.0 - 10.5 K/uL   RBC 4.51  3.87 - 5.11 MIL/uL   Hemoglobin 13.8  12.0 - 15.0 g/dL   HCT 16.1  09.6 - 04.5 %   MCV 86.9  78.0 - 100.0 fL   MCH 30.6  26.0 - 34.0 pg   MCHC 35.2  30.0 - 36.0 g/dL   RDW 40.9  81.1 - 91.4 %   Platelets 251  150 - 400 K/uL   Neutrophils Relative % 84 (*) 43 - 77 %   Neutro Abs 9.7 (*) 1.7 - 7.7 K/uL   Lymphocytes Relative 11 (*) 12 - 46 %   Lymphs Abs 1.3  0.7 - 4.0 K/uL   Monocytes Relative 4  3 - 12 %   Monocytes Absolute 0.4  0.1 - 1.0 K/uL   Eosinophils Relative 0  0 - 5 %   Eosinophils Absolute 0.0  0.0 - 0.7 K/uL   Basophils Relative 0  0 - 1 %   Basophils Absolute 0.0  0.0 - 0.1 K/uL  COMPREHENSIVE METABOLIC PANEL      Result Value Ref Range   Sodium 136 (*) 137 - 147 mEq/L   Potassium 3.5 (*) 3.7 - 5.3 mEq/L   Chloride 95 (*) 96 - 112 mEq/L   CO2 24  19 - 32 mEq/L   Glucose, Bld 122 (*) 70 - 99 mg/dL   BUN 13  6 - 23 mg/dL   Creatinine, Ser 7.82  0.50 - 1.10 mg/dL   Calcium 9.6  8.4 - 16.1 mg/dL   Total Protein 7.4  6.0 - 8.3 g/dL   Albumin 4.1  3.5 - 5.2 g/dL   AST 24  0 - 37 U/L   ALT 11  0 - 35 U/L   Alkaline Phosphatase 53  39 - 117 U/L   Total Bilirubin 0.5  0.3 - 1.2 mg/dL   GFR calc non Af Amer 51 (*) >90 mL/min   GFR calc Af Amer 59 (*) >90 mL/min  TROPONIN I      Result Value Ref Range   Troponin I <0.30  <0.30 ng/mL  PROTIME-INR      Result Value Ref Range   Prothrombin Time  12.9  11.6 - 15.2 seconds   INR 0.99  0.00 - 1.49  APTT      Result Value Ref Range   aPTT 21 (*) 24 - 37 seconds  URINALYSIS, ROUTINE W REFLEX MICROSCOPIC      Result Value Ref Range   Color, Urine YELLOW  YELLOW   APPearance CLEAR  CLEAR   Specific Gravity, Urine 1.016  1.005 - 1.030   pH 7.5  5.0 - 8.0   Glucose, UA NEGATIVE  NEGATIVE mg/dL   Hgb urine dipstick NEGATIVE  NEGATIVE   Bilirubin Urine NEGATIVE  NEGATIVE   Ketones, ur NEGATIVE  NEGATIVE mg/dL   Protein, ur NEGATIVE  NEGATIVE mg/dL   Urobilinogen, UA 1.0  0.0 - 1.0 mg/dL   Nitrite NEGATIVE  NEGATIVE   Leukocytes, UA NEGATIVE  NEGATIVE  CBG MONITORING, ED      Result Value Ref Range   Glucose-Capillary 130 (*) 70 - 99 mg/dL   Comment 1 Notify RN     Comment 2 Documented in Chart     Dg Chest 2 View  05/26/2013   CLINICAL DATA:  Fall, altered mental status  EXAM: CHEST  2 VIEW  COMPARISON:  03/15/2013  FINDINGS: Interstitial coarsening/ COPD. Apical scarring, left greater than right. No confluent airspace opacity, pleural effusion, or pneumothorax. Heart size within normal range. Atherosclerotic vascular calcifications and aortic tortuosity. Partially imaged aortic endograft within the abdomen. Diffuse osteopenia limits evaluation of the thoracic spine.  IMPRESSION: COPD.  No focal consolidation.   Electronically Signed   By: Jearld Lesch M.D.   On: 05/26/2013 22:19   Dg Lumbar Spine Complete  05/26/2013   CLINICAL DATA:  Lower back pain after fall.  EXAM: LUMBAR SPINE - COMPLETE 4+ VIEW  COMPARISON:  None.  FINDINGS: Status post endograft repair of infrarenal abdominal aortic aneurysm. No fracture or significant spondylolisthesis is noted. Disc spaces are well-maintained. Diffuse osteopenia is noted. Posterior facet joints appear normal.  IMPRESSION: No significant abnormality seen in the lumbar spine.   Electronically Signed   By: Roque Lias M.D.   On: 05/26/2013 22:44   Dg Hip Bilateral W/pelvis  05/26/2013    CLINICAL DATA:  Pain after fall.  EXAM: BILATERAL HIP WITH PELVIS - 4+ VIEW  COMPARISON:  None.  FINDINGS: The hip and sacroiliac joints appear normal. No fracture or dislocation is seen.  IMPRESSION: Normal pelvis.   Electronically Signed   By: Roque Lias M.D.   On: 05/26/2013 22:47   Ct Head Wo Contrast  05/26/2013   CLINICAL DATA:  Fall, head trauma.  EXAM: CT HEAD WITHOUT CONTRAST  CT CERVICAL SPINE WITHOUT CONTRAST  TECHNIQUE: Multidetector CT imaging of the head and cervical spine was performed following the standard protocol  without intravenous contrast. Multiplanar CT image reconstructions of the cervical spine were also generated.  COMPARISON:  04/08/2013  FINDINGS: CT HEAD FINDINGS  Degraded by unconventional positioning, which may obscure subtle pathology. Within this limitation; advanced periventricular and subcortical white matter hypodensities, a nonspecific finding often seen in the setting of chronic microangiopathic change. No overt hydrocephalus or acute infarction. No intraparenchymal hemorrhage, mass, mass effect. Thin isodense subdurals difficult to exclude for example image 19 on the right and image 17 on the left series 2. No significant mass effect or midline shift. No displaced calvarial fracture. Right posterior scalp hematoma.  CT CERVICAL SPINE FINDINGS  Apical emphysema and scarring. Osteopenia. Maintained craniocervical relationship. No dens fracture. Maintained vertebral body height and alignment. Multilevel degenerative change, most pronounced at C6-7. Paravertebral soft tissues within normal limits.  IMPRESSION: Degraded by unconventional positioning. Thin isodense subdurals are difficult to exclude. No significant mass effect. Recommend short-term follow-up CT or MRI.  Right posterior scalp hematoma.  No underlying calvarial fracture.  White matter hypodensities are similar to prior, a nonspecific finding often seen in the setting of chronic microangiopathic change.  Mild  multilevel degenerative changes of the cervical spine. No acute osseous finding.  Critical Value/emergent results were called by telephone at the time of interpretation on 05/26/2013 at 10:49 PM to Dr. Raymon Mutton , who verbally acknowledged these results.   Electronically Signed   By: Jearld Lesch M.D.   On: 05/26/2013 22:53   Ct Cervical Spine Wo Contrast  05/26/2013   CLINICAL DATA:  Fall, head trauma.  EXAM: CT HEAD WITHOUT CONTRAST  CT CERVICAL SPINE WITHOUT CONTRAST  TECHNIQUE: Multidetector CT imaging of the head and cervical spine was performed following the standard protocol without intravenous contrast. Multiplanar CT image reconstructions of the cervical spine were also generated.  COMPARISON:  04/08/2013  FINDINGS: CT HEAD FINDINGS  Degraded by unconventional positioning, which may obscure subtle pathology. Within this limitation; advanced periventricular and subcortical white matter hypodensities, a nonspecific finding often seen in the setting of chronic microangiopathic change. No overt hydrocephalus or acute infarction. No intraparenchymal hemorrhage, mass, mass effect. Thin isodense subdurals difficult to exclude for example image 19 on the right and image 17 on the left series 2. No significant mass effect or midline shift. No displaced calvarial fracture. Right posterior scalp hematoma.  CT CERVICAL SPINE FINDINGS  Apical emphysema and scarring. Osteopenia. Maintained craniocervical relationship. No dens fracture. Maintained vertebral body height and alignment. Multilevel degenerative change, most pronounced at C6-7. Paravertebral soft tissues within normal limits.  IMPRESSION: Degraded by unconventional positioning. Thin isodense subdurals are difficult to exclude. No significant mass effect. Recommend short-term follow-up CT or MRI.  Right posterior scalp hematoma.  No underlying calvarial fracture.  White matter hypodensities are similar to prior, a nonspecific finding often seen in  the setting of chronic microangiopathic change.  Mild multilevel degenerative changes of the cervical spine. No acute osseous finding.  Critical Value/emergent results were called by telephone at the time of interpretation on 05/26/2013 at 10:49 PM to Dr. Raymon Mutton , who verbally acknowledged these results.   Electronically Signed   By: Jearld Lesch M.D.   On: 05/26/2013 22:53   Labs Review Labs Reviewed  CBC WITH DIFFERENTIAL - Abnormal; Notable for the following:    WBC 11.5 (*)    Neutrophils Relative % 84 (*)    Neutro Abs 9.7 (*)    Lymphocytes Relative 11 (*)    All other components within normal limits  COMPREHENSIVE METABOLIC PANEL - Abnormal; Notable for the following:    Sodium 136 (*)    Potassium 3.5 (*)    Chloride 95 (*)    Glucose, Bld 122 (*)    GFR calc non Af Amer 51 (*)    GFR calc Af Amer 59 (*)    All other components within normal limits  APTT - Abnormal; Notable for the following:    aPTT 21 (*)    All other components within normal limits  CBG MONITORING, ED - Abnormal; Notable for the following:    Glucose-Capillary 130 (*)    All other components within normal limits  TROPONIN I  PROTIME-INR  URINALYSIS, ROUTINE W REFLEX MICROSCOPIC   Imaging Review Dg Chest 2 View  05/26/2013   CLINICAL DATA:  Fall, altered mental status  EXAM: CHEST  2 VIEW  COMPARISON:  03/15/2013  FINDINGS: Interstitial coarsening/ COPD. Apical scarring, left greater than right. No confluent airspace opacity, pleural effusion, or pneumothorax. Heart size within normal range. Atherosclerotic vascular calcifications and aortic tortuosity. Partially imaged aortic endograft within the abdomen. Diffuse osteopenia limits evaluation of the thoracic spine.  IMPRESSION: COPD.  No focal consolidation.   Electronically Signed   By: Jearld Lesch M.D.   On: 05/26/2013 22:19   Dg Lumbar Spine Complete  05/26/2013   CLINICAL DATA:  Lower back pain after fall.  EXAM: LUMBAR SPINE - COMPLETE  4+ VIEW  COMPARISON:  None.  FINDINGS: Status post endograft repair of infrarenal abdominal aortic aneurysm. No fracture or significant spondylolisthesis is noted. Disc spaces are well-maintained. Diffuse osteopenia is noted. Posterior facet joints appear normal.  IMPRESSION: No significant abnormality seen in the lumbar spine.   Electronically Signed   By: Roque Lias M.D.   On: 05/26/2013 22:44   Dg Hip Bilateral W/pelvis  05/26/2013   CLINICAL DATA:  Pain after fall.  EXAM: BILATERAL HIP WITH PELVIS - 4+ VIEW  COMPARISON:  None.  FINDINGS: The hip and sacroiliac joints appear normal. No fracture or dislocation is seen.  IMPRESSION: Normal pelvis.   Electronically Signed   By: Roque Lias M.D.   On: 05/26/2013 22:47   Ct Head Wo Contrast  05/26/2013   CLINICAL DATA:  Fall, head trauma.  EXAM: CT HEAD WITHOUT CONTRAST  CT CERVICAL SPINE WITHOUT CONTRAST  TECHNIQUE: Multidetector CT imaging of the head and cervical spine was performed following the standard protocol without intravenous contrast. Multiplanar CT image reconstructions of the cervical spine were also generated.  COMPARISON:  04/08/2013  FINDINGS: CT HEAD FINDINGS  Degraded by unconventional positioning, which may obscure subtle pathology. Within this limitation; advanced periventricular and subcortical white matter hypodensities, a nonspecific finding often seen in the setting of chronic microangiopathic change. No overt hydrocephalus or acute infarction. No intraparenchymal hemorrhage, mass, mass effect. Thin isodense subdurals difficult to exclude for example image 19 on the right and image 17 on the left series 2. No significant mass effect or midline shift. No displaced calvarial fracture. Right posterior scalp hematoma.  CT CERVICAL SPINE FINDINGS  Apical emphysema and scarring. Osteopenia. Maintained craniocervical relationship. No dens fracture. Maintained vertebral body height and alignment. Multilevel degenerative change, most  pronounced at C6-7. Paravertebral soft tissues within normal limits.  IMPRESSION: Degraded by unconventional positioning. Thin isodense subdurals are difficult to exclude. No significant mass effect. Recommend short-term follow-up CT or MRI.  Right posterior scalp hematoma.  No underlying calvarial fracture.  White matter hypodensities are similar to prior, a nonspecific finding often  seen in the setting of chronic microangiopathic change.  Mild multilevel degenerative changes of the cervical spine. No acute osseous finding.  Critical Value/emergent results were called by telephone at the time of interpretation on 05/26/2013 at 10:49 PM to Dr. Raymon Mutton , who verbally acknowledged these results.   Electronically Signed   By: Jearld Lesch M.D.   On: 05/26/2013 22:53   Ct Cervical Spine Wo Contrast  05/26/2013   CLINICAL DATA:  Fall, head trauma.  EXAM: CT HEAD WITHOUT CONTRAST  CT CERVICAL SPINE WITHOUT CONTRAST  TECHNIQUE: Multidetector CT imaging of the head and cervical spine was performed following the standard protocol without intravenous contrast. Multiplanar CT image reconstructions of the cervical spine were also generated.  COMPARISON:  04/08/2013  FINDINGS: CT HEAD FINDINGS  Degraded by unconventional positioning, which may obscure subtle pathology. Within this limitation; advanced periventricular and subcortical white matter hypodensities, a nonspecific finding often seen in the setting of chronic microangiopathic change. No overt hydrocephalus or acute infarction. No intraparenchymal hemorrhage, mass, mass effect. Thin isodense subdurals difficult to exclude for example image 19 on the right and image 17 on the left series 2. No significant mass effect or midline shift. No displaced calvarial fracture. Right posterior scalp hematoma.  CT CERVICAL SPINE FINDINGS  Apical emphysema and scarring. Osteopenia. Maintained craniocervical relationship. No dens fracture. Maintained vertebral body height  and alignment. Multilevel degenerative change, most pronounced at C6-7. Paravertebral soft tissues within normal limits.  IMPRESSION: Degraded by unconventional positioning. Thin isodense subdurals are difficult to exclude. No significant mass effect. Recommend short-term follow-up CT or MRI.  Right posterior scalp hematoma.  No underlying calvarial fracture.  White matter hypodensities are similar to prior, a nonspecific finding often seen in the setting of chronic microangiopathic change.  Mild multilevel degenerative changes of the cervical spine. No acute osseous finding.  Critical Value/emergent results were called by telephone at the time of interpretation on 05/26/2013 at 10:49 PM to Dr. Raymon Mutton , who verbally acknowledged these results.   Electronically Signed   By: Jearld Lesch M.D.   On: 05/26/2013 22:53     EKG Interpretation None      Date: 05/27/2013  Rate: 92  Rhythm: normal sinus rhythm  QRS Axis: right  Intervals: QT prolonged  ST/T Wave abnormalities: normal  Conduction Disutrbances:none  Narrative Interpretation:   Old EKG Reviewed: unchanged EKG analyzed and reviewed by this provider and attending physician - compared to EKG on 03/05/2013   MDM   Final diagnoses:  Altered mental status  Fall   Medications  sodium chloride 0.9 % bolus 1,000 mL (1,000 mLs Intravenous New Bag/Given 05/26/13 2331)  ondansetron (ZOFRAN) injection 4 mg (4 mg Intravenous Given 05/26/13 2339)   Filed Vitals:   05/27/13 0015 05/27/13 0030 05/27/13 0045 05/27/13 0100  BP: 161/73 160/66 168/71 153/71  Pulse: 79 79 79 81  Temp:      TempSrc:      Resp: 17 15 18 15   SpO2: 100% 100% 100% 100%    Patient presenting to the ED with fall that occurred prior to arrival to the ED - as per daughter who was a witness stated that the patient had a mechanical fall where she lost her balance, fell and landed on her head. As per daughter reported that patient was responsive and alert directly  after the event. Daughter reported that later on patient had an episode of confusion and disorientation and that patient would not respond when her name was  called. Husband reported that patient has been having increased alerted mental status for the past 2 weeks and is concerned - reported that she has been having altered mental status weeks before this event occurred, stated that she has not been the same mentally since October 2014 after her AAA repair. Husband reported that patient seems to have a short term memory loss, is unable to work the TV, and losing ability to remember to do things that she did on a daily basis. Husband reported that they have brought it to the attention of the patient's PCP who reported that this is due to depression or early stages of dementia.  This provider reviewed the patient's chart. Patient was seen and assessed in the ED setting in 02/2013 regarding continuous headaches and memory loss. Patient visited the ED due to not being able to remember her daughter's name. Patient received a CT scan of the head which was negative. Later patient had a CTA of the head, secondary to patient not being able to have MRI secondary to metal in jaw. 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 Alert. Oriented x2-baseline as per family, baseline for the past 2 weeks. Negative facial trauma identified. Hematoma identified to the right aspect of the head, near vertex. Patient placed in c-collar. Negative tracheal deviation. Heart rate and rhythm normal. Radial pulses 2+ bilaterally. Cap refill less than 3 seconds. Lungs clear to auscultation to upper lower lobes bilaterally. Bowel sounds normoactive in all 4 quadrants-soft-benign abdominal exam. Discomfort upon palpation to the lumbar spine - mid-spinal and paraspinal bilaterally. Full range of motion to upper and lower extremities bilaterally without difficulty noted. Sensation intact. Strength intact with  equal distribution. EKG noted normal sinus rhythm with a heart rate of 92 bpm. First troponin negative elevation. CBC noted mild elevated white cell count of 11.5 with mildly elevated neutrophils-negative left shift noted. Pro time INR within normal limits. APTT mildly low at 21. UA negative for infection - negative nitrites and leukocytes noted. CBG 130. CMP noted mildly low sodium of 136, the low potassium of 3.5. Lumbar spine negative for acute osseous injury. Bilateral hips with pelvis noted negative acute osseous injury. Chest xray noted COPD with negative focal consolidation. CT head noted thin isodensity subdurals that are difficult to exclude - recommended repeat CT scan/MRI. Mild multilevel degenerative changes of the cervical spine with no acute osseous findings.  Negative focal neurological deficits noted. Negative findings of acute bleeds, mass lesions, or mass effect noted on CT. CT scan of the head concerning for possible subdural that cannot be excluded, possible artifact based on c-collar and positioning of patient's head - recommended MRI or repeat CT to be performed as per requested by physician reading the scan. Negative urine infection or pneumonia noted. Negative findings that can identify AMS that has been ongoing since October 2014 with worsening over the past 2 weeks. This provider spoke with Hospitalist who reported that patient to be admitted to observation in MedSurg bed concerning for questionable subdural from fall that occurred today with need for repeat imaging and with worsening AMS over the past 2 weeks. Patient to get CT scan repeat while in the hospital, cannot get MRI secondary to metal in jaw. Patient and family agreed to plan of admission. Patient stable for transfer.   Raymon Mutton, PA-C 05/27/13 [address], PA-C 05/27/13 1316

## 2013-05-27 ENCOUNTER — Observation Stay (HOSPITAL_COMMUNITY): Payer: Medicare HMO

## 2013-05-27 ENCOUNTER — Encounter (HOSPITAL_COMMUNITY): Payer: Self-pay | Admitting: Radiology

## 2013-05-27 ENCOUNTER — Ambulatory Visit (HOSPITAL_COMMUNITY): Payer: Medicare HMO

## 2013-05-27 DIAGNOSIS — R569 Unspecified convulsions: Secondary | ICD-10-CM

## 2013-05-27 DIAGNOSIS — R4182 Altered mental status, unspecified: Secondary | ICD-10-CM | POA: Diagnosis present

## 2013-05-27 DIAGNOSIS — E039 Hypothyroidism, unspecified: Secondary | ICD-10-CM

## 2013-05-27 DIAGNOSIS — Y92009 Unspecified place in unspecified non-institutional (private) residence as the place of occurrence of the external cause: Secondary | ICD-10-CM

## 2013-05-27 DIAGNOSIS — G9349 Other encephalopathy: Secondary | ICD-10-CM

## 2013-05-27 DIAGNOSIS — R413 Other amnesia: Secondary | ICD-10-CM

## 2013-05-27 DIAGNOSIS — W19XXXA Unspecified fall, initial encounter: Secondary | ICD-10-CM

## 2013-05-27 DIAGNOSIS — J449 Chronic obstructive pulmonary disease, unspecified: Secondary | ICD-10-CM

## 2013-05-27 LAB — COMPREHENSIVE METABOLIC PANEL
ALT: 11 U/L (ref 0–35)
AST: 24 U/L (ref 0–37)
Albumin: 4.1 g/dL (ref 3.5–5.2)
Alkaline Phosphatase: 53 U/L (ref 39–117)
BUN: 13 mg/dL (ref 6–23)
CALCIUM: 9.6 mg/dL (ref 8.4–10.5)
CO2: 24 mEq/L (ref 19–32)
Chloride: 95 mEq/L — ABNORMAL LOW (ref 96–112)
Creatinine, Ser: 1.1 mg/dL (ref 0.50–1.10)
GFR calc Af Amer: 59 mL/min — ABNORMAL LOW (ref >90)
GFR calc non Af Amer: 51 mL/min — ABNORMAL LOW (ref >90)
Glucose, Bld: 122 mg/dL — ABNORMAL HIGH (ref 70–99)
Potassium: 3.5 mEq/L — ABNORMAL LOW (ref 3.7–5.3)
SODIUM: 136 meq/L — AB (ref 137–147)
Total Bilirubin: 0.5 mg/dL (ref 0.3–1.2)
Total Protein: 7.4 g/dL (ref 6.0–8.3)

## 2013-05-27 LAB — PROTIME-INR
INR: 0.99 (ref 0.00–1.49)
Prothrombin Time: 12.9 seconds (ref 11.6–15.2)

## 2013-05-27 LAB — APTT: aPTT: 21 seconds — ABNORMAL LOW (ref 24–37)

## 2013-05-27 LAB — TROPONIN I: Troponin I: <0.3 ng/mL (ref <0.30)

## 2013-05-27 MED ORDER — ARIPIPRAZOLE 2 MG PO TABS
2.0000 mg | ORAL_TABLET | Freq: Every day | ORAL | Status: DC
  Administered 2013-05-27 – 2013-05-28 (×2): 2 mg via ORAL
  Filled 2013-05-27 (×2): qty 1

## 2013-05-27 MED ORDER — SODIUM CHLORIDE 0.9 % IV SOLN: 250.0000 mL | INTRAVENOUS | Status: DC | PRN

## 2013-05-27 MED ORDER — ALBUTEROL SULFATE (2.5 MG/3ML) 0.083% IN NEBU: 3.0000 mL | INHALATION_SOLUTION | Freq: Four times a day (QID) | RESPIRATORY_TRACT | Status: DC | PRN

## 2013-05-27 MED ORDER — ATORVASTATIN CALCIUM 40 MG PO TABS
40.0000 mg | ORAL_TABLET | Freq: Every day | ORAL | Status: DC
  Administered 2013-05-27 – 2013-05-28 (×2): 40 mg via ORAL
  Filled 2013-05-27 (×2): qty 1

## 2013-05-27 MED ORDER — BUPROPION HCL ER (XL) 150 MG PO TB24
150.0000 mg | ORAL_TABLET | Freq: Every day | ORAL | Status: DC
  Administered 2013-05-27: 150 mg via ORAL
  Filled 2013-05-27: qty 1

## 2013-05-27 MED ORDER — PANTOPRAZOLE SODIUM 40 MG PO TBEC
40.0000 mg | DELAYED_RELEASE_TABLET | Freq: Every day | ORAL | Status: DC
  Administered 2013-05-27 – 2013-05-28 (×2): 40 mg via ORAL
  Filled 2013-05-27 (×2): qty 1

## 2013-05-27 MED ORDER — FENOFIBRATE 160 MG PO TABS
160.0000 mg | ORAL_TABLET | Freq: Every day | ORAL | Status: DC
Start: 2013-05-27 — End: 2013-05-28
  Administered 2013-05-27 – 2013-05-28 (×2): 160 mg via ORAL
  Filled 2013-05-27 (×2): qty 1

## 2013-05-27 MED ORDER — SODIUM CHLORIDE 0.9 % IJ SOLN
3.0000 mL | Freq: Two times a day (BID) | INTRAMUSCULAR | Status: DC
  Administered 2013-05-27 – 2013-05-28 (×4): 3 mL via INTRAVENOUS

## 2013-05-27 MED ORDER — BOOST PLUS PO LIQD
237.0000 mL | Freq: Two times a day (BID) | ORAL | Status: DC
  Administered 2013-05-27 – 2013-05-28 (×3): 237 mL via ORAL
  Filled 2013-05-27 (×5): qty 237

## 2013-05-27 MED ORDER — TIOTROPIUM BROMIDE MONOHYDRATE 18 MCG IN CAPS
18.0000 ug | ORAL_CAPSULE | Freq: Every day | RESPIRATORY_TRACT | Status: DC
  Filled 2013-05-27: qty 5

## 2013-05-27 MED ORDER — LEVOTHYROXINE SODIUM 75 MCG PO TABS
75.0000 ug | ORAL_TABLET | Freq: Every day | ORAL | Status: DC
  Administered 2013-05-27 – 2013-05-28 (×2): 75 ug via ORAL
  Filled 2013-05-27 (×3): qty 1

## 2013-05-27 MED ORDER — POLYETHYLENE GLYCOL 3350 17 G PO PACK
17.0000 g | PACK | Freq: Two times a day (BID) | ORAL | Status: DC | PRN
  Filled 2013-05-27: qty 1

## 2013-05-27 MED ORDER — SODIUM CHLORIDE 0.9 % IJ SOLN: 3.0000 mL | INTRAMUSCULAR | Status: DC | PRN

## 2013-05-27 NOTE — Progress Notes (Signed)
PROGRESS NOTE  Amy Bates ZOX:[id number] DOB: Jul 27, 1945 DOA: 05/26/2013 PCP: Neena Rhymes, MD  68 yo female with h/o memory loss for 6 months that seemed to start after her, AAA repair oct 14, and COPD.  She comes in after falling at home. Pt states she lost her balance and fell and hit her head on a concrete curb. Denies loc. Easily confused.  Family has left. Pt denies any recent illnessess. No n/v/d. No headache. Her w/u has been neg in ED except ct head with ?subdural hematoma.   After admission the family provides additional history that their mother had convulsive movements, frothing at the mouth and cyanosis of the lips while attempting to load her into the pick up truck after her fall.  Assessment/Plan:  ?Subdural Hematoma - Initial CT scan of the head done on admission (3/12) showed a questionable subdural hematoma, however a repeat CT scan done on 3/13 negative for subdural hematoma. Continue to monitor.  Suspected PRES - Repeat CT scan of the head done on 3/13 showed suspected PRES. Reported of seizure-like activity yesterday after she fell,? Could be precipitated by PRES. Etiology not evident, blood pressure seems to be stable. Await neurology evaluation.  ? Seizure-like activity  - Family reports seizure-like activity after patient fell on 3/12. EEG done but the official report pending. No further seizure-like activities seem since hospitalization. Neurology consulted. -Unable to have MRI due to metal in her jaw.  Mechanical Fall with right posterior scalp hematoma -PT evaluation ordered. -Xrays are negative.  Confusion and memory loss. -Started after AAA repair 6 months ago,being worked up outpatient.Has seen Dr. Everlena Cooper, Neurology outpatient. -SXS being attributed to depression and dementia.  Placed on Celexa and Abilify by PCP.  Leukocytosis -Likely from fall  -U/A is clear -CXR is neg for infection, but shows COPD - Continue to monitor off  antibiotics  COPD -Oxygen sats are good on room air. Lungs are clear - Continue with as needed nebulized albuterol.  Right hip pain -Painful to palpation. -Xray negative for fracture -Presumed secondary to fall -Supportive care.  AAA -Appears stable s/p repair.   No complaints of chest pain.  Hypothyroidism - Continue with levothyroxine  Dyslipidemia - c/w statins and fenofibrate DVT Prophylaxis:  SCDs Code Status: Full Family Communication: daughter and patient's husband are at bedside. Disposition Plan: to home with family when able.   Consultants: Neurology  Procedures:  EEG pending.  Antibiotics: Anti-infectives   None       HPI/Subjective: Patient slow to respond.  Family speaks for her.   Objective: Filed Vitals:   05/27/13 0224 05/27/13 0313 05/27/13 0320 05/27/13 0608  BP: 156/78 155/74  133/84  Pulse: 76 70  77  Temp: 97.7 F (36.5 C) 98.4 F (36.9 C)  98.4 F (36.9 C)  TempSrc:  Oral  Oral  Resp: 20 18  16   Height:  5\' 7"  (1.702 m)    Weight:  59.3 kg (130 lb 11.7 oz)    SpO2: 100% 99% 100% 98%    Intake/Output Summary (Last 24 hours) at 05/27/13 1133 Last data filed at 05/27/13 0900  Gross per 24 hour  Intake      0 ml  Output      0 ml  Net      0 ml   Filed Weights   05/27/13 0313  Weight: 59.3 kg (130 lb 11.7 oz)    Exam: General: Thin, but wd, wn female, appears older than stated age.  tearful.  Slight right facial droop HEENT:  PERR, EOMI, Anicteic Sclera, MMM. No pharyngeal erythema or exudates  Neck: Supple, no JVD, no masses  Cardiovascular: RRR, S1 S2 auscultated, no rubs, murmurs or gallops.   Respiratory: Clear to auscultation bilaterally with equal chest rise  Abdomen: Soft, nontender, nondistended, + bowel sounds  Extremities: warm dry without cyanosis clubbing or edema.  Right hip tender to palpation Neuro: AAOx3, cranial nerves grossly intact. Strength 5/5 in upper and lower extremities  Skin: Without rashes  exudates or nodules.   Psych: quiet, pleasant, cries easily.   Data Reviewed: Basic Metabolic Panel:  Recent Labs Lab 05/26/13 2340  NA 136*  K 3.5*  CL 95*  CO2 24  GLUCOSE 122*  BUN 13  CREATININE 1.10  CALCIUM 9.6   Liver Function Tests:  Recent Labs Lab 05/26/13 2340  AST 24  ALT 11  ALKPHOS 53  BILITOT 0.5  PROT 7.4  ALBUMIN 4.1   CBC:  Recent Labs Lab 05/26/13 2340  WBC 11.5*  NEUTROABS 9.7*  HGB 13.8  HCT 39.2  MCV 86.9  PLT 251   Cardiac Enzymes:  Recent Labs Lab 05/26/13 2340  TROPONINI <0.30   CBG:  Recent Labs Lab 05/26/13 1950  GLUCAP 130*     Studies: Dg Chest 2 View  05/26/2013   CLINICAL DATA:  Fall, altered mental status  EXAM: CHEST  2 VIEW  COMPARISON:  03/15/2013  FINDINGS: Interstitial coarsening/ COPD. Apical scarring, left greater than right. No confluent airspace opacity, pleural effusion, or pneumothorax. Heart size within normal range. Atherosclerotic vascular calcifications and aortic tortuosity. Partially imaged aortic endograft within the abdomen. Diffuse osteopenia limits evaluation of the thoracic spine.  IMPRESSION: COPD.  No focal consolidation.   Electronically Signed   By: Jearld Lesch M.D.   On: 05/26/2013 22:19   Dg Lumbar Spine Complete  05/26/2013   CLINICAL DATA:  Lower back pain after fall.  EXAM: LUMBAR SPINE - COMPLETE 4+ VIEW  COMPARISON:  None.  FINDINGS: Status post endograft repair of infrarenal abdominal aortic aneurysm. No fracture or significant spondylolisthesis is noted. Disc spaces are well-maintained. Diffuse osteopenia is noted. Posterior facet joints appear normal.  IMPRESSION: No significant abnormality seen in the lumbar spine.   Electronically Signed   By: Roque Lias M.D.   On: 05/26/2013 22:44   Dg Hip Bilateral W/pelvis  05/26/2013   CLINICAL DATA:  Pain after fall.  EXAM: BILATERAL HIP WITH PELVIS - 4+ VIEW  COMPARISON:  None.  FINDINGS: The hip and sacroiliac joints appear normal. No  fracture or dislocation is seen.  IMPRESSION: Normal pelvis.   Electronically Signed   By: Roque Lias M.D.   On: 05/26/2013 22:47   Ct Head Wo Contrast  05/27/2013   CLINICAL DATA:  Fall.  Mammary loss per for 6 months.  EXAM: CT HEAD WITHOUT CONTRAST  TECHNIQUE: Contiguous axial images were obtained from the base of the skull through the vertex without intravenous contrast.  COMPARISON:  CT HEAD W/O CM dated 05/26/2013; CT HEAD W/O CM dated 03/15/2013; CT C SPINE W/O CM dated 05/26/2013; CT ANGIO HEAD W/CM &/OR WO/CM dated 04/08/2013  FINDINGS: The patient has a known 3 mm left MCA aneurysm which is not readily apparent on today's exam but which was shown on the prior CT angiogram of 04/08/2013.  Accentuated white matter hypodensity in the cerebrum is diffuse but with some mild posterior confluence. This has been present over the past year but appears progressive. Comparing back  to 03/15/2013, there is also a reduced prominence of the cerebral sulci.  There is a right posterior vertex scalp hematoma. No definite acute intracranial hemorrhage is identified. No intracranial hemorrhage is confirmed on today's exam. No definite mass lesion. No specific findings of acute CVA.  IMPRESSION: 1. Confluent generalized white matter hypodensities in the cerebrum. Although likely mostly due to chronic ischemic microvascular white matter disease, the reduced conspicuity of sulci over the past 3 months, for example along the cerebral vertex, as well as the posterior confluence of hypodensity raise the possibility of superimposed posterior reversible encephalopathy (PRES). 2. No compelling findings of intracranial hemorrhage. 3. The patient has a known 3 mm left MCA aneurysm, not readily seen on today's exam but shown on the prior CT angiogram of 04/08/2013. 4. Right posterior vertex scalp hematoma   Electronically Signed   By: Herbie BaltimoreWalt  Liebkemann M.D.   On: 05/27/2013 09:25   Ct Head Wo Contrast  05/26/2013   CLINICAL DATA:   Fall, head trauma.  EXAM: CT HEAD WITHOUT CONTRAST  CT CERVICAL SPINE WITHOUT CONTRAST  TECHNIQUE: Multidetector CT imaging of the head and cervical spine was performed following the standard protocol without intravenous contrast. Multiplanar CT image reconstructions of the cervical spine were also generated.  COMPARISON:  04/08/2013  FINDINGS: CT HEAD FINDINGS  Degraded by unconventional positioning, which may obscure subtle pathology. Within this limitation; advanced periventricular and subcortical white matter hypodensities, a nonspecific finding often seen in the setting of chronic microangiopathic change. No overt hydrocephalus or acute infarction. No intraparenchymal hemorrhage, mass, mass effect. Thin isodense subdurals difficult to exclude for example image 19 on the right and image 17 on the left series 2. No significant mass effect or midline shift. No displaced calvarial fracture. Right posterior scalp hematoma.  CT CERVICAL SPINE FINDINGS  Apical emphysema and scarring. Osteopenia. Maintained craniocervical relationship. No dens fracture. Maintained vertebral body height and alignment. Multilevel degenerative change, most pronounced at C6-7. Paravertebral soft tissues within normal limits.  IMPRESSION: Degraded by unconventional positioning. Thin isodense subdurals are difficult to exclude. No significant mass effect. Recommend short-term follow-up CT or MRI.  Right posterior scalp hematoma.  No underlying calvarial fracture.  White matter hypodensities are similar to prior, a nonspecific finding often seen in the setting of chronic microangiopathic change.  Mild multilevel degenerative changes of the cervical spine. No acute osseous finding.  Critical Value/emergent results were called by telephone at the time of interpretation on 05/26/2013 at 10:49 PM to Dr. Raymon MuttonMARISSA SCIACCA , who verbally acknowledged these results.   Electronically Signed   By: Jearld LeschAndrew  DelGaizo M.D.   On: 05/26/2013 22:53   Ct  Cervical Spine Wo Contrast  05/26/2013   CLINICAL DATA:  Fall, head trauma.  EXAM: CT HEAD WITHOUT CONTRAST  CT CERVICAL SPINE WITHOUT CONTRAST  TECHNIQUE: Multidetector CT imaging of the head and cervical spine was performed following the standard protocol without intravenous contrast. Multiplanar CT image reconstructions of the cervical spine were also generated.  COMPARISON:  04/08/2013  FINDINGS: CT HEAD FINDINGS  Degraded by unconventional positioning, which may obscure subtle pathology. Within this limitation; advanced periventricular and subcortical white matter hypodensities, a nonspecific finding often seen in the setting of chronic microangiopathic change. No overt hydrocephalus or acute infarction. No intraparenchymal hemorrhage, mass, mass effect. Thin isodense subdurals difficult to exclude for example image 19 on the right and image 17 on the left series 2. No significant mass effect or midline shift. No displaced calvarial fracture. Right posterior scalp  hematoma.  CT CERVICAL SPINE FINDINGS  Apical emphysema and scarring. Osteopenia. Maintained craniocervical relationship. No dens fracture. Maintained vertebral body height and alignment. Multilevel degenerative change, most pronounced at C6-7. Paravertebral soft tissues within normal limits.  IMPRESSION: Degraded by unconventional positioning. Thin isodense subdurals are difficult to exclude. No significant mass effect. Recommend short-term follow-up CT or MRI.  Right posterior scalp hematoma.  No underlying calvarial fracture.  White matter hypodensities are similar to prior, a nonspecific finding often seen in the setting of chronic microangiopathic change.  Mild multilevel degenerative changes of the cervical spine. No acute osseous finding.  Critical Value/emergent results were called by telephone at the time of interpretation on 05/26/2013 at 10:49 PM to Dr. Raymon Mutton , who verbally acknowledged these results.   Electronically Signed   By:  Jearld Lesch M.D.   On: 05/26/2013 22:53    Scheduled Meds: . ARIPiprazole  2 mg Oral Daily  . atorvastatin  40 mg Oral Daily  . buPROPion  150 mg Oral Daily  . fenofibrate  160 mg Oral Daily  . levothyroxine  75 mcg Oral QAC breakfast  . pantoprazole  40 mg Oral Daily  . sodium chloride  3 mL Intravenous Q12H  . tiotropium  18 mcg Inhalation Daily   Continuous Infusions:   Principal Problem:   Fall at home Active Problems:   HYPERLIPIDEMIA   COPD   Memory loss   AAA (abdominal aortic aneurysm)   Altered mental status    Conley Canal  [facility] Pager [phone]. If 7PM-7AM, please contact night-coverage at [email or web address], password [facility] 05/27/2013, 11:33 AM  LOS: 1 day   Attending Seen and examined, we will do assessment and plan. Above documentation was reviewed 68 year old unfortunate lady who underwent endovascular repair of a abdominal aortic aneurysm in October of 2014, subsequently following that she has had significant decline in her functional status. Her seen in urology in the outpatient setting, and deemed to have neurological issue secondary to depression. Admitted on 3/12 after a fall. Questionable seizure-like activity. 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 Await neurology evaluation, await formal PT evaluation.  Windell Norfolk MD

## 2013-05-27 NOTE — ED Notes (Signed)
Patient wife updated with room #.

## 2013-05-27 NOTE — Progress Notes (Signed)
UR completed 

## 2013-05-27 NOTE — Consult Note (Signed)
NEURO HOSPITALIST CONSULT NOTE    Reason for Consult: Seizure  HPI:                                                                                                                                          Amy Bates is an 68 y.o. female who was recently see by Dr. Everlena Cooper of [facility] for memory loss and word finding difficulty associated with HA. Per Dr. Moises Blood note 04/06/13 "She was found to have a 5.4 x 4.8cm unruptured infrarenal abdominal aortic aneurysm after experiencing three weeks of abdominal pain. She underwent endovascular repair and stenting on 01/11/13. Since that time, she and her husband have noted periods of confusion. She initially began having word-finding difficulties where she would lose track of thought and would fumble for the words. She understands others and is able to read without difficulty. This was intermittent but becoming more frequent. She also began experiencing bi-frontal and retro-orbital headaches, described as pounding, about 6/10 (not worse headache of her life) and not associated with nausea, vomiting, photophobia, phonophobia, or visual disturbance.  Whenever she has word-finding difficulties, her husband will start asking her questions to test her. She presented to the ED on 03/15/13 because she was not able to tell her husband the year or her daughters' names. CT Head revealed chronic small vessel ischemic changes, but no evidence of acute infarct, mass lesion or hemorrhage. She was diagnosed with an upper respiratory infection. She has a history of depression, but she has had significant increase in depression since the surgery. She is very tearful and cries easily. " due to patient not able to have MRI (metal in jaw) CTA of head was obtained.  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 No imaging evidence of hemorrhage. 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 initial CT there was was question of thin isodense subdural's but repeat scan ruled this out but did note confluent generalized white matter hypodensities in the cerebrum with question of PRES. After admission hospitalist talked to family and there was some mention of possible seizure activity by family with "convulsive movements, frothing at the mouth and cyanosis of the lips while attempting to load her into the pick up truck after her fall."  I have attempted to contact family twice to confirm this but no answer. Patient has had no further seizure activity while in hospital.  Currently she is in bed and not able to give in depth history.  She is alert but only follows simple commands.  When asked questions she will either try to think of a answer or speak in broken nonsensical speech. As  noted above she had these problems when she presented to Dr. Everlena Cooper.    Of note:  she is on Abilify and Wellbutrin 150 mg XL which was not noted when she saw Dr. Everlena Cooper on 04/06/13 EEG pending    Past Medical History  Diagnosis Date  . Arthritis   . Allergic rhinitis   . Asthma   . Depression   . GERD (gastroesophageal reflux disease)   . Hyperlipidemia   . Urinary incontinence   . History of chicken pox   . COPD (chronic obstructive pulmonary disease)   . Pneumonia   . Chest pain     [facility]  . Hypothyroid     Past Surgical History  Procedure Laterality Date  . Tubal ligation    . Tonsillectomy    . Knee surgery      right  . Endovascular stent insertion N/A 01/11/2013    Procedure: ENDOVASCULAR STENT GRAFT INSERTION; ULTRASOUND GUIDED, Renal Stent left renal artery , open left brachial artery closure.;  Surgeon: Nada Libman, MD;  Location: [facility] OR;  Service: Vascular;  Laterality: N/A;  . Abdominal aortic aneurysm repair      Family History  Problem Relation Age of Onset  . Coronary artery disease Mother   . Stroke Mother    . Hypertension Neg Hx   . Diabetes Neg Hx   . Cancer Neg Hx      Social History:  reports that she has quit smoking. She started smoking about 4 months ago. She does not have any smokeless tobacco history on file. She reports that she does not drink alcohol or use illicit drugs.  Allergies  Allergen Reactions  . Ibuprofen Other (See Comments)    Patient has a history of ulcers    MEDICATIONS:                                                                                                                     Prior to Admission:  Prescriptions prior to admission  Medication Sig Dispense Refill  . albuterol (PROVENTIL HFA;VENTOLIN HFA) 108 (90 BASE) MCG/ACT inhaler Inhale 2 puffs into the lungs every 6 (six) hours as needed for wheezing.      . ARIPiprazole (ABILIFY) 2 MG tablet Take 1 tablet (2 mg total) by mouth daily.  30 tablet  3  . atorvastatin (LIPITOR) 40 MG tablet Take 1 tablet (40 mg total) by mouth daily.  90 tablet  3  . buPROPion (WELLBUTRIN XL) 150 MG 24 hr tablet Take 150 mg by mouth daily.      . fenofibrate 160 MG tablet Take 160 mg by mouth daily.      Marland Kitchen guaifenesin (HUMIBID E) 400 MG TABS tablet Take 400 mg by mouth every 4 (four) hours as needed (for congestion).      Marland Kitchen levothyroxine (SYNTHROID, LEVOTHROID) 75 MCG tablet Take 75 mcg by mouth daily before breakfast.      . mometasone (ASMANEX) 220 MCG/INH inhaler Inhale 2 puffs into the  lungs daily.      . pantoprazole (PROTONIX) 40 MG tablet Take 1 tablet (40 mg total) by mouth daily.  30 tablet  3  . polyethylene glycol (MIRALAX / GLYCOLAX) packet Take 17 g by mouth 2 (two) times daily as needed (for constipation).      Marland Kitchen tiotropium (SPIRIVA) 18 MCG inhalation capsule Place 18 mcg into inhaler and inhale daily.       Scheduled: . ARIPiprazole  2 mg Oral Daily  . atorvastatin  40 mg Oral Daily  . fenofibrate  160 mg Oral Daily  . lactose free nutrition  237 mL Oral BID BM  . levothyroxine  75 mcg Oral QAC  breakfast  . pantoprazole  40 mg Oral Daily  . sodium chloride  3 mL Intravenous Q12H  . tiotropium  18 mcg Inhalation Daily     ROS:                                                                                                                                       History obtained from unobtainable from patient due to mental status   Blood pressure 121/74, pulse 80, temperature 98.5 F (36.9 C), temperature source Oral, resp. rate 16, height 5\' 7"  (1.702 m), weight 59.3 kg (130 lb 11.7 oz), SpO2 94.00%.   Neurologic Examination:                                                                                                        Lab Results: Basic Metabolic Panel:Mental Status: Alert, not oriented to place, year, month or own age.  Speech fluent with expressive difficulties and broken speech.  Able to follow simple commands such as touching her nose, sticking tongue out, following finger but at times I needed to repeat myself.  Cranial Nerves: II: Discs flat bilaterally; blinks to threat bilaterally, pupils equal, round, reactive to light and accommodation III,IV, VI: ptosis not present, extra-ocular motions intact bilaterally V,VII: smile asymmetric on the right at rest but not when speaking, facial light touch sensation normal bilaterally VIII: hearing normal bilaterally IX,X: gag reflex present XI: bilateral shoulder shrug XII: midline tongue extension without atrophy or fasciculations  Motor: Moving all extremities antigravity equally with 4/5 strength Tone and bulk:normal tone throughout; no atrophy noted Sensory: withdrawals to pain briskly in all 4 extremities.  Deep Tendon Reflexes:  Right: Upper Extremity   Left: Upper extremity   biceps (C-5 to C-6) 2/4   biceps (C-5 to C-6)  2/4 tricep (C7) 2/4    triceps (C7) 2/4 Brachioradialis (C6) 2/4  Brachioradialis (C6) 2/4  Lower Extremity Lower Extremity  quadriceps (L-2 to L-4) 2/4   quadriceps (L-2 to L-4)  2/4 Achilles (S1) 1/4   Achilles (S1) 1/4  Plantars: Right: downgoing   Left: downgoing Cerebellar: normal finger-to-nose,  Un able to obtain heel-to-shin test Gait: not tested CV: pulses palpable throughout    Recent Labs Lab 05/26/13 2340  NA 136*  K 3.5*  CL 95*  CO2 24  GLUCOSE 122*  BUN 13  CREATININE 1.10  CALCIUM 9.6    Liver Function Tests:  Recent Labs Lab 05/26/13 2340  AST 24  ALT 11  ALKPHOS 53  BILITOT 0.5  PROT 7.4  ALBUMIN 4.1   No results found for this basename: LIPASE, AMYLASE,  in the last 168 hours No results found for this basename: AMMONIA,  in the last 168 hours  CBC:  Recent Labs Lab 05/26/13 2340  WBC 11.5*  NEUTROABS 9.7*  HGB 13.8  HCT 39.2  MCV 86.9  PLT 251    Cardiac Enzymes:  Recent Labs Lab 05/26/13 2340  TROPONINI <0.30    Lipid Panel: No results found for this basename: CHOL, TRIG, HDL, CHOLHDL, VLDL, LDLCALC,  in the last 168 hours  CBG:  Recent Labs Lab 05/26/13 1950  GLUCAP 130*    Microbiology: Results for orders placed during the hospital encounter of 01/11/13  MRSA PCR SCREENING     Status: None   Collection Time    01/11/13  7:50 PM      Result Value Ref Range Status   MRSA by PCR NEGATIVE  NEGATIVE Final   Comment:            The GeneXpert MRSA Assay (FDA     approved for NASAL specimens     only), is one component of a     comprehensive MRSA colonization     surveillance program. It is not     intended to diagnose MRSA     infection nor to guide or     monitor treatment for     MRSA infections.    Coagulation Studies:  Recent Labs  05/26/13 2340  LABPROT 12.9  INR 0.99    Imaging: Dg Chest 2 View  05/26/2013   CLINICAL DATA:  Fall, altered mental status  EXAM: CHEST  2 VIEW  COMPARISON:  03/15/2013  FINDINGS: Interstitial coarsening/ COPD. Apical scarring, left greater than right. No confluent airspace opacity, pleural effusion, or pneumothorax. Heart size within normal  range. Atherosclerotic vascular calcifications and aortic tortuosity. Partially imaged aortic endograft within the abdomen. Diffuse osteopenia limits evaluation of the thoracic spine.  IMPRESSION: COPD.  No focal consolidation.   Electronically Signed   By: Jearld LeschAndrew  DelGaizo M.D.   On: 05/26/2013 22:19   Dg Lumbar Spine Complete  05/26/2013   CLINICAL DATA:  Lower back pain after fall.  EXAM: LUMBAR SPINE - COMPLETE 4+ VIEW  COMPARISON:  None.  FINDINGS: Status post endograft repair of infrarenal abdominal aortic aneurysm. No fracture or significant spondylolisthesis is noted. Disc spaces are well-maintained. Diffuse osteopenia is noted. Posterior facet joints appear normal.  IMPRESSION: No significant abnormality seen in the lumbar spine.   Electronically Signed   By: Roque LiasJames  Green M.D.   On: 05/26/2013 22:44   Dg Hip Bilateral W/pelvis  05/26/2013   CLINICAL DATA:  Pain after fall.  EXAM: BILATERAL HIP WITH PELVIS - 4+ VIEW  COMPARISON:  None.  FINDINGS: The hip and sacroiliac joints appear normal. No fracture or dislocation is seen.  IMPRESSION: Normal pelvis.   Electronically Signed   By: Roque Lias M.D.   On: 05/26/2013 22:47   Ct Head Wo Contrast  05/27/2013   CLINICAL DATA:  Fall.  Mammary loss per for 6 months.  EXAM: CT HEAD WITHOUT CONTRAST  TECHNIQUE: Contiguous axial images were obtained from the base of the skull through the vertex without intravenous contrast.  COMPARISON:  CT HEAD W/O CM dated 05/26/2013; CT HEAD W/O CM dated 03/15/2013; CT C SPINE W/O CM dated 05/26/2013; CT ANGIO HEAD W/CM &/OR WO/CM dated 04/08/2013  FINDINGS: The patient has a known 3 mm left MCA aneurysm which is not readily apparent on today's exam but which was shown on the prior CT angiogram of 04/08/2013.  Accentuated white matter hypodensity in the cerebrum is diffuse but with some mild posterior confluence. This has been present over the past year but appears progressive. Comparing back to 03/15/2013, there is also a  reduced prominence of the cerebral sulci.  There is a right posterior vertex scalp hematoma. No definite acute intracranial hemorrhage is identified. No intracranial hemorrhage is confirmed on today's exam. No definite mass lesion. No specific findings of acute CVA.  IMPRESSION: 1. Confluent generalized white matter hypodensities in the cerebrum. Although likely mostly due to chronic ischemic microvascular white matter disease, the reduced conspicuity of sulci over the past 3 months, for example along the cerebral vertex, as well as the posterior confluence of hypodensity raise the possibility of superimposed posterior reversible encephalopathy (PRES). 2. No compelling findings of intracranial hemorrhage. 3. The patient has a known 3 mm left MCA aneurysm, not readily seen on today's exam but shown on the prior CT angiogram of 04/08/2013. 4. Right posterior vertex scalp hematoma   Electronically Signed   By: Herbie Baltimore M.D.   On: 05/27/2013 09:25   Ct Head Wo Contrast  05/26/2013   CLINICAL DATA:  Fall, head trauma.  EXAM: CT HEAD WITHOUT CONTRAST  CT CERVICAL SPINE WITHOUT CONTRAST  TECHNIQUE: Multidetector CT imaging of the head and cervical spine was performed following the standard protocol without intravenous contrast. Multiplanar CT image reconstructions of the cervical spine were also generated.  COMPARISON:  04/08/2013  FINDINGS: CT HEAD FINDINGS  Degraded by unconventional positioning, which may obscure subtle pathology. Within this limitation; advanced periventricular and subcortical white matter hypodensities, a nonspecific finding often seen in the setting of chronic microangiopathic change. No overt hydrocephalus or acute infarction. No intraparenchymal hemorrhage, mass, mass effect. Thin isodense subdurals difficult to exclude for example image 19 on the right and image 17 on the left series 2. No significant mass effect or midline shift. No displaced calvarial fracture. Right posterior scalp  hematoma.  CT CERVICAL SPINE FINDINGS  Apical emphysema and scarring. Osteopenia. Maintained craniocervical relationship. No dens fracture. Maintained vertebral body height and alignment. Multilevel degenerative change, most pronounced at C6-7. Paravertebral soft tissues within normal limits.  IMPRESSION: Degraded by unconventional positioning. Thin isodense subdurals are difficult to exclude. No significant mass effect. Recommend short-term follow-up CT or MRI.  Right posterior scalp hematoma.  No underlying calvarial fracture.  White matter hypodensities are similar to prior, a nonspecific finding often seen in the setting of chronic microangiopathic change.  Mild multilevel degenerative changes of the cervical spine. No acute osseous finding.  Critical Value/emergent results were called by telephone at the time of interpretation on 05/26/2013 at 10:49 PM to Dr.  MARISSA SCIACCA , who verbally acknowledged these results.   Electronically Signed   By: Jearld Lesch M.D.   On: 05/26/2013 22:53   Ct Cervical Spine Wo Contrast  05/26/2013   CLINICAL DATA:  Fall, head trauma.  EXAM: CT HEAD WITHOUT CONTRAST  CT CERVICAL SPINE WITHOUT CONTRAST  TECHNIQUE: Multidetector CT imaging of the head and cervical spine was performed following the standard protocol without intravenous contrast. Multiplanar CT image reconstructions of the cervical spine were also generated.  COMPARISON:  04/08/2013  FINDINGS: CT HEAD FINDINGS  Degraded by unconventional positioning, which may obscure subtle pathology. Within this limitation; advanced periventricular and subcortical white matter hypodensities, a nonspecific finding often seen in the setting of chronic microangiopathic change. No overt hydrocephalus or acute infarction. No intraparenchymal hemorrhage, mass, mass effect. Thin isodense subdurals difficult to exclude for example image 19 on the right and image 17 on the left series 2. No significant mass effect or midline shift. No  displaced calvarial fracture. Right posterior scalp hematoma.  CT CERVICAL SPINE FINDINGS  Apical emphysema and scarring. Osteopenia. Maintained craniocervical relationship. No dens fracture. Maintained vertebral body height and alignment. Multilevel degenerative change, most pronounced at C6-7. Paravertebral soft tissues within normal limits.  IMPRESSION: Degraded by unconventional positioning. Thin isodense subdurals are difficult to exclude. No significant mass effect. Recommend short-term follow-up CT or MRI.  Right posterior scalp hematoma.  No underlying calvarial fracture.  White matter hypodensities are similar to prior, a nonspecific finding often seen in the setting of chronic microangiopathic change.  Mild multilevel degenerative changes of the cervical spine. No acute osseous finding.  Critical Value/emergent results were called by telephone at the time of interpretation on 05/26/2013 at 10:49 PM to Dr. Raymon Mutton , who verbally acknowledged these results.   Electronically Signed   By: Jearld Lesch M.D.   On: 05/26/2013 22:53    Felicie Morn PA-C [facility] [phone]  05/27/2013, 5:33 PM  Patient seen and examined.  Clinical course and management discussed.  Necessary edits performed.  I agree with the above.  Assessment and plan of care developed and discussed below.   Assessment/Plan: 68 year old female presenting after being witnessed to have a seizure at home by family.  Work up has included a head CT that was initially was felt to exhibit evidence of SDH.  Repeat head CT has been reviewed and compared to the previous.  There does not appear to be any SDH and no acute changes are noted either.  Patient is on Wellbutrin and has had a recent increase in dosage.  This may very well have predisposed her to seizure.  Due to the likelihood of the seizure being provoked would not consider chronic anticonvulsant therapy.  Patient unable to have a MRI of the brain.     Recommendations: 1.  D/C Wellbutrin 2.  Would not replace this antidepressant at this time.  This may be done by her outpatient psychiatrist. 3.  Patient unable to drive, operate heavy machinery, perform activities at heights and participate in water activities until release by outpatient physician. 4.  Patient to keep her scheduled outpatient appointment with Dr. Everlena Cooper 5.  Will follow up results of EEG    Thana Farr, MD [facility] [phone]  05/27/2013  5:33 PM

## 2013-05-27 NOTE — Progress Notes (Signed)
Received pt report from [facility],RN-ED. Awaiting pt arrival to floor.

## 2013-05-27 NOTE — Evaluation (Signed)
Physical Therapy Evaluation Patient Details Name: Amy Bates MRN: [id number] DOB: Sep 07, 1945 Today's Date: 05/27/2013 Time: 4098-1191 PT Time Calculation (min): 11 min  PT Assessment / Plan / Recommendation History of Present Illness  68 yo female with h/o memory loss for 6 months, copd, AAA repair oct 14, comes in after falling at home.  Pt states she lost her balance and fell and hit her head.  Denies loc.  Easily confused.  Per ED report she is normally confused easily, and this is her baseline mental status per family report.  Family has left.  Pt denies any recent illnessess.  No n/v/d.  No headache.  Her w/u has been neg in ED except ct head with ?subdural hematoma.  Asked to obs to repeat ct head later.  Pt has no focal neurological symptoms at this time except she does easily get confused.  Clinical Impression  PT eval limited at this time due to interruption by another dept to transport pt for EEG; Pt is participatory, oriented only to self and slow to process at this time; she does follow commands consistently with incr time; She may need STSNF to get back to her baseline; Pt husband reports I mobility but with recent decline balance/coordination lately.    PT Assessment  Patient needs continued PT services    Follow Up Recommendations  SNF (vs HHPT -eval limited by another dept/test)    Does the patient have the potential to tolerate intense rehabilitation      Barriers to Discharge        Equipment Recommendations  None recommended by PT    Recommendations for Other Services     Frequency Min 3X/week    Precautions / Restrictions Precautions Precautions: Fall Restrictions Weight Bearing Restrictions: No   Pertinent Vitals/Pain Denies pain      Mobility  Bed Mobility Overal bed mobility: Needs Assistance Bed Mobility: Rolling Rolling: Min assist General bed mobility comments: assist to initiate movement, use of rail and multi-modal cues to complete task     Exercises     PT Diagnosis: Generalized weakness  PT Problem List: Decreased activity tolerance;Decreased mobility;Decreased safety awareness PT Treatment Interventions: DME instruction;Gait training;Functional mobility training;Therapeutic activities;Therapeutic exercise;Balance training     PT Goals(Current goals can be found in the care plan section) Acute Rehab PT Goals Patient Stated Goal: husband wants pt to get stronger PT Goal Formulation: With patient/family Time For Goal Achievement: 06/03/13 Potential to Achieve Goals: Good  Visit Information  Last PT Received On: 05/27/13 Assistance Needed: +1 History of Present Illness: 68 yo female with h/o memory loss for 6 months, copd, AAA repair oct 14, comes in after falling at home.  Pt states she lost her balance and fell and hit her head.  Denies loc.  Easily confused.  Per ED report she is normally confused easily, and this is her baseline mental status per family report.  Family has left.  Pt denies any recent illnessess.  No n/v/d.  No headache.  Her w/u has been neg in ED except ct head with ?subdural hematoma.  Asked to obs to repeat ct head later.  Pt has no focal neurological symptoms at this time except she does easily get confused.       Prior Functioning  Home Living Family/patient expects to be discharged to:: Private residence Living Arrangements: Spouse/significant other Available Help at Discharge: Family Type of Home: House Home Layout: One level;Able to live on main level with bedroom/bathroom;Laundry or work area in basement  Home Equipment: Walker - 2 wheels;Walker - 4 wheels Additional Comments: pt does no to go down stairs Prior Function Level of Independence: Independent Comments: husband cooks; pt husband reports pt more unsteady lately Communication Communication: No difficulties    Cognition  Cognition Arousal/Alertness: Awake/alert Behavior During Therapy: WFL for tasks assessed/performed;Flat  affect Overall Cognitive Status: Impaired/Different from baseline Area of Impairment: Orientation;Memory Orientation Level: Disoriented to;Place;Situation;Time Memory: Decreased short-term memory General Comments: husband reports pt is very much "in and out" Ox4 one minute and then not oriented at all the next    Extremity/Trunk Assessment Upper Extremity Assessment Upper Extremity Assessment: Generalized weakness;Defer to OT evaluation Lower Extremity Assessment Lower Extremity Assessment: Generalized weakness ([facility])   Balance General Comments General comments (skin integrity, edema, etc.): pt with slight facial asymetry but able to smile, sick out tongue without difficulty; follows commands consistently but somewhat slow to process at times;   End of Session PT - End of Session Activity Tolerance:  (EVAL interrupted by transport for EEG) Patient left: in bed;with family/visitor present  GP Functional Assessment Tool Used: clinical judgement   [facility] And Medical CenterWILLIAMS,Deaundra Kutzer 05/27/2013, 10:39 AM

## 2013-05-27 NOTE — ED Provider Notes (Addendum)
Medical screening examination/treatment/procedure(s) were conducted as a shared visit with non-physician practitioner(s) and myself.  I personally evaluated the patient during the encounter.   EKG Interpretation None     Amy Bates is a 68 yo female who presents to ER with cc of fall and AMS.  Prolonged ER stay and extensive evaluation performed.  CT head revealed possible SDH - radiology recommended repeat imaging, pt unable to have MRI due to metal in jaw.  No focal deficits noted, however pt is more confused than her baseline.  Discussed with medicine.  Plan for admission to medicine obs and repeat CT as inpatient.  No issues during ER stay.    Darlys Galesavid Masneri, MD 05/27/13 1359  Darlys Galesavid Masneri, MD 06/16/13 Amy [id number]

## 2013-05-27 NOTE — H&P (Signed)
PCP:   Neena Rhymes, MD   Chief Complaint:  fall  HPI: 68 yo female with h/o memory loss for 6 months, copd, AAA repair oct 14, comes in after falling at home.  Pt states she lost her balance and fell and hit her head.  Denies loc.  Easily confused.  Per ED report she is normally confused easily, and this is her baseline mental status per family report.  Family has left.  Pt denies any recent illnessess.  No n/v/d.  No headache.  Her w/u has been neg in ED except ct head with ?subdural hematoma.  Asked to obs to repeat ct head later.  Pt has no focal neurological symptoms at this time except she does easily get confused.  Review of Systems:  Positive and negative as per HPI otherwise all other systems are negative unsure of reliability  Past Medical History: Past Medical History  Diagnosis Date  . Arthritis   . Allergic rhinitis   . Asthma   . Depression   . GERD (gastroesophageal reflux disease)   . Hyperlipidemia   . Urinary incontinence   . History of chicken pox   . COPD (chronic obstructive pulmonary disease)   . Pneumonia   . Chest pain     [facility]  . Hypothyroid    Past Surgical History  Procedure Laterality Date  . Tubal ligation    . Tonsillectomy    . Knee surgery      right  . Endovascular stent insertion N/A 01/11/2013    Procedure: ENDOVASCULAR STENT GRAFT INSERTION; ULTRASOUND GUIDED, Renal Stent left renal artery , open left brachial artery closure.;  Surgeon: Nada Libman, MD;  Location: [facility] OR;  Service: Vascular;  Laterality: N/A;  . Abdominal aortic aneurysm repair      Medications: Prior to Admission medications   Medication Sig Start Date End Date Taking? Authorizing Provider  albuterol (PROVENTIL HFA;VENTOLIN HFA) 108 (90 BASE) MCG/ACT inhaler Inhale 2 puffs into the lungs every 6 (six) hours as needed for wheezing.   Yes Historical Provider, MD  ARIPiprazole (ABILIFY) 2 MG tablet Take 1 tablet (2 mg total) by mouth daily. 04/27/13   Yes Sheliah Hatch, MD  atorvastatin (LIPITOR) 40 MG tablet Take 1 tablet (40 mg total) by mouth daily. 01/31/13  Yes Sheliah Hatch, MD  buPROPion (WELLBUTRIN XL) 150 MG 24 hr tablet Take 150 mg by mouth daily.   Yes Historical Provider, MD  fenofibrate 160 MG tablet Take 160 mg by mouth daily.   Yes Historical Provider, MD  guaifenesin (HUMIBID E) 400 MG TABS tablet Take 400 mg by mouth every 4 (four) hours as needed (for congestion).   Yes Historical Provider, MD  levothyroxine (SYNTHROID, LEVOTHROID) 75 MCG tablet Take 75 mcg by mouth daily before breakfast.   Yes Historical Provider, MD  mometasone (ASMANEX) 220 MCG/INH inhaler Inhale 2 puffs into the lungs daily.   Yes Historical Provider, MD  pantoprazole (PROTONIX) 40 MG tablet Take 1 tablet (40 mg total) by mouth daily. 04/27/13  Yes Sheliah Hatch, MD  polyethylene glycol (MIRALAX / GLYCOLAX) packet Take 17 g by mouth 2 (two) times daily as needed (for constipation).   Yes Historical Provider, MD  tiotropium (SPIRIVA) 18 MCG inhalation capsule Place 18 mcg into inhaler and inhale daily.   Yes Historical Provider, MD    Allergies:   Allergies  Allergen Reactions  . Ibuprofen Other (See Comments)    Patient has a history of ulcers  Social History:  reports that she has quit smoking. She started smoking about 4 months ago. She does not have any smokeless tobacco history on file. She reports that she does not drink alcohol or use illicit drugs.  Family History: Family History  Problem Relation Age of Onset  . Coronary artery disease Mother   . Stroke Mother   . Hypertension Neg Hx   . Diabetes Neg Hx   . Cancer Neg Hx     Physical Exam: Filed Vitals:   05/26/13 2115 05/26/13 2300 05/26/13 2315 05/26/13 2330  BP: 173/70 171/76 160/73 161/67  Pulse: 83 84 77 83  Temp:      TempSrc:      Resp: 20 17 16 18   SpO2: 100% 100% 100% 100%   General appearance: alert, cooperative and no distress Head:  Normocephalic, without obvious abnormality, atraumatic Eyes: negative Nose: Nares normal. Septum midline. Mucosa normal. No drainage or sinus tenderness. Neck: no JVD and supple, symmetrical, trachea midline Lungs: clear to auscultation bilaterally Heart: regular rate and rhythm, S1, S2 normal, no murmur, click, rub or gallop Abdomen: soft, non-tender; bowel sounds normal; no masses,  no organomegaly Extremities: extremities normal, atraumatic, no cyanosis or edema Pulses: 2+ and symmetric Skin: Skin color, texture, turgor normal. No rashes or lesions Neurologic: Grossly normal Flat affect   Labs on Admission:   Recent Labs  05/26/13 2340  NA 136*  K 3.5*  CL 95*  CO2 24  GLUCOSE 122*  BUN 13  CREATININE 1.10  CALCIUM 9.6    Recent Labs  05/26/13 2340  AST 24  ALT 11  ALKPHOS 53  BILITOT 0.5  PROT 7.4  ALBUMIN 4.1    Recent Labs  05/26/13 2340  WBC 11.5*  NEUTROABS 9.7*  HGB 13.8  HCT 39.2  MCV 86.9  PLT 251    Recent Labs  05/26/13 2340  TROPONINI <0.30   Radiological Exams on Admission: Dg Chest 2 View  05/26/2013   CLINICAL DATA:  Fall, altered mental status  EXAM: CHEST  2 VIEW  COMPARISON:  03/15/2013  FINDINGS: Interstitial coarsening/ COPD. Apical scarring, left greater than right. No confluent airspace opacity, pleural effusion, or pneumothorax. Heart size within normal range. Atherosclerotic vascular calcifications and aortic tortuosity. Partially imaged aortic endograft within the abdomen. Diffuse osteopenia limits evaluation of the thoracic spine.  IMPRESSION: COPD.  No focal consolidation.   Electronically Signed   By: Jearld Lesch M.D.   On: 05/26/2013 22:19   Dg Lumbar Spine Complete  05/26/2013   CLINICAL DATA:  Lower back pain after fall.  EXAM: LUMBAR SPINE - COMPLETE 4+ VIEW  COMPARISON:  None.  FINDINGS: Status post endograft repair of infrarenal abdominal aortic aneurysm. No fracture or significant spondylolisthesis is noted. Disc  spaces are well-maintained. Diffuse osteopenia is noted. Posterior facet joints appear normal.  IMPRESSION: No significant abnormality seen in the lumbar spine.   Electronically Signed   By: Roque Lias M.D.   On: 05/26/2013 22:44   Dg Hip Bilateral W/pelvis  05/26/2013   CLINICAL DATA:  Pain after fall.  EXAM: BILATERAL HIP WITH PELVIS - 4+ VIEW  COMPARISON:  None.  FINDINGS: The hip and sacroiliac joints appear normal. No fracture or dislocation is seen.  IMPRESSION: Normal pelvis.   Electronically Signed   By: Roque Lias M.D.   On: 05/26/2013 22:47   Ct Head Wo Contrast  05/26/2013   CLINICAL DATA:  Fall, head trauma.  EXAM: CT HEAD WITHOUT CONTRAST  CT CERVICAL SPINE WITHOUT CONTRAST  TECHNIQUE: Multidetector CT imaging of the head and cervical spine was performed following the standard protocol without intravenous contrast. Multiplanar CT image reconstructions of the cervical spine were also generated.  COMPARISON:  04/08/2013  FINDINGS: CT HEAD FINDINGS  Degraded by unconventional positioning, which may obscure subtle pathology. Within this limitation; advanced periventricular and subcortical white matter hypodensities, a nonspecific finding often seen in the setting of chronic microangiopathic change. No overt hydrocephalus or acute infarction. No intraparenchymal hemorrhage, mass, mass effect. Thin isodense subdurals difficult to exclude for example image 19 on the right and image 17 on the left series 2. No significant mass effect or midline shift. No displaced calvarial fracture. Right posterior scalp hematoma.  CT CERVICAL SPINE FINDINGS  Apical emphysema and scarring. Osteopenia. Maintained craniocervical relationship. No dens fracture. Maintained vertebral body height and alignment. Multilevel degenerative change, most pronounced at C6-7. Paravertebral soft tissues within normal limits.  IMPRESSION: Degraded by unconventional positioning. Thin isodense subdurals are difficult to exclude. No  significant mass effect. Recommend short-term follow-up CT or MRI.  Right posterior scalp hematoma.  No underlying calvarial fracture.  White matter hypodensities are similar to prior, a nonspecific finding often seen in the setting of chronic microangiopathic change.  Mild multilevel degenerative changes of the cervical spine. No acute osseous finding.  Critical Value/emergent results were called by telephone at the time of interpretation on 05/26/2013 at 10:49 PM to Dr. Raymon Mutton , who verbally acknowledged these results.   Electronically Signed   By: Jearld Lesch M.D.   On: 05/26/2013 22:53   Ct Cervical Spine Wo Contrast  05/26/2013   CLINICAL DATA:  Fall, head trauma.  EXAM: CT HEAD WITHOUT CONTRAST  CT CERVICAL SPINE WITHOUT CONTRAST  TECHNIQUE: Multidetector CT imaging of the head and cervical spine was performed following the standard protocol without intravenous contrast. Multiplanar CT image reconstructions of the cervical spine were also generated.  COMPARISON:  04/08/2013  FINDINGS: CT HEAD FINDINGS  Degraded by unconventional positioning, which may obscure subtle pathology. Within this limitation; advanced periventricular and subcortical white matter hypodensities, a nonspecific finding often seen in the setting of chronic microangiopathic change. No overt hydrocephalus or acute infarction. No intraparenchymal hemorrhage, mass, mass effect. Thin isodense subdurals difficult to exclude for example image 19 on the right and image 17 on the left series 2. No significant mass effect or midline shift. No displaced calvarial fracture. Right posterior scalp hematoma.  CT CERVICAL SPINE FINDINGS  Apical emphysema and scarring. Osteopenia. Maintained craniocervical relationship. No dens fracture. Maintained vertebral body height and alignment. Multilevel degenerative change, most pronounced at C6-7. Paravertebral soft tissues within normal limits.  IMPRESSION: Degraded by unconventional positioning.  Thin isodense subdurals are difficult to exclude. No significant mass effect. Recommend short-term follow-up CT or MRI.  Right posterior scalp hematoma.  No underlying calvarial fracture.  White matter hypodensities are similar to prior, a nonspecific finding often seen in the setting of chronic microangiopathic change.  Mild multilevel degenerative changes of the cervical spine. No acute osseous finding.  Critical Value/emergent results were called by telephone at the time of interpretation on 05/26/2013 at 10:49 PM to Dr. Raymon Mutton , who verbally acknowledged these results.   Electronically Signed   By: Jearld Lesch M.D.   On: 05/26/2013 22:53    Assessment/Plan  68 yo female with mechanical fall, memory loss for 6 months and questionable subdural hematoma  Principal Problem:   Fall at home-  obs to repeat  ct head at around 8am.  Hold asa.  freq neuro cks overnight.  obs on med surg.  No neuro def at this time.  Active Problems:  Stable unless listed o/w   HYPERLIPIDEMIA   COPD   Memory loss-  Needs neurological eval for memory loss for over last 3 months which can been done outpt  ???component of depression??   AAA (abdominal aortic aneurysm)    DAVID,RACHAL A 05/27/2013, 1:03 AM

## 2013-05-27 NOTE — Progress Notes (Signed)
INITIAL NUTRITION ASSESSMENT  DOCUMENTATION CODES Per approved criteria  -Severe malnutrition in the context of chronic illness   INTERVENTION: Add Boost Plus po BID, each supplement provides 360 kcal and 14 grams of protein. Note only "Rich Chocolate" flavor available. Recommend diet liberalization to Regular diet to maximize PO choices. RD to continue to follow nutrition care plan.  NUTRITION DIAGNOSIS: Inadequate oral intake related to poor appetite as evidenced by family report and ongoing weight loss.   Goal: Intake to meet >90% of estimated nutrition needs.  Monitor:  weight trends, lab trends, I/O's, PO intake, supplement tolerance  Reason for Assessment: Malnutrition Screening Tool  68 y.o. female  Admitting Dx: Fall at home  ASSESSMENT: PMHx significant for memory loss x 6 months, COPD, AAA repair. Admitted s/p fall at home with possible seizure afterwards. Work-up reveals possible SDH.  Pt with 12% wt loss x 5 months. This is significant for this time frame. Discussed nutrition hx with daughters and husband at bedside. Pt has always been a picky eater, per family, however she has been eating much less since the middle of January. She will take a few bites at mealtimes and then get full. Pt with severe temporal wasting evident as well as moderate-severe clavicle wasting. Ate a few bites of bagel with cream cheese this morning.  Has chocolate Boost at home and rarely drinks it. Family would like it offered while here.  Pt meets criteria for severe MALNUTRITION in the context of chronic illness as evidenced by 12% wt loss x 5 months and intake of <75% x at least 1 month. Pt also with severe muscle mass loss.  Sodium low at 136 Potassium low at 3.5 CBG 130 Meds of note include: protonix daily, IVF discontinued - family is encouraging pt to drink well   Height: Ht Readings from Last 1 Encounters:  05/27/13 5\' 7"  (1.702 m)    Weight: Wt Readings from Last 1  Encounters:  05/27/13 130 lb 11.7 oz (59.3 kg)    Ideal Body Weight: 135 lb  % Ideal Body Weight: 96%  Wt Readings from Last 10 Encounters:  05/27/13 130 lb 11.7 oz (59.3 kg)  04/28/13 135 lb 8 oz (61.462 kg)  04/06/13 134 lb (60.782 kg)  03/01/13 138 lb 4 oz (62.71 kg)  02/07/13 138 lb (62.596 kg)  01/31/13 140 lb 8 oz (63.73 kg)  01/13/13 146 lb 8 oz (66.452 kg)  01/13/13 146 lb 8 oz (66.452 kg)  01/10/13 146 lb 4 oz (66.339 kg)  07/29/12 153 lb (69.4 kg)    Usual Body Weight: 140 - 145 lb  % Usual Body Weight: 88%  BMI:  Body mass index is 20.47 kg/(m^2). WNL  Estimated Nutritional Needs: Kcal: 1475 - 1600 Protein: 60 - 70 g Fluid: at least 1.5 liters daily  Skin: intact  Diet Order: Cardiac  EDUCATION NEEDS: -No education needs identified at this time   Intake/Output Summary (Last 24 hours) at 05/27/13 1204 Last data filed at 05/27/13 0900  Gross per 24 hour  Intake      0 ml  Output      0 ml  Net      0 ml    Last BM: 3/12  Labs:   Recent Labs Lab 05/26/13 2340  NA 136*  K 3.5*  CL 95*  CO2 24  BUN 13  CREATININE 1.10  CALCIUM 9.6  GLUCOSE 122*    CBG (last 3)   Recent Labs  05/26/13 1950  GLUCAP 130*    Scheduled Meds: . ARIPiprazole  2 mg Oral Daily  . atorvastatin  40 mg Oral Daily  . buPROPion  150 mg Oral Daily  . fenofibrate  160 mg Oral Daily  . levothyroxine  75 mcg Oral QAC breakfast  . pantoprazole  40 mg Oral Daily  . sodium chloride  3 mL Intravenous Q12H  . tiotropium  18 mcg Inhalation Daily    Continuous Infusions:   Past Medical History  Diagnosis Date  . Arthritis   . Allergic rhinitis   . Asthma   . Depression   . GERD (gastroesophageal reflux disease)   . Hyperlipidemia   . Urinary incontinence   . History of chicken pox   . COPD (chronic obstructive pulmonary disease)   . Pneumonia   . Chest pain     [facility]  . Hypothyroid     Past Surgical History  Procedure Laterality Date  .  Tubal ligation    . Tonsillectomy    . Knee surgery      right  . Endovascular stent insertion N/A 01/11/2013    Procedure: ENDOVASCULAR STENT GRAFT INSERTION; ULTRASOUND GUIDED, Renal Stent left renal artery , open left brachial artery closure.;  Surgeon: Nada Libman, MD;  Location: [facility] OR;  Service: Vascular;  Laterality: N/A;  . Abdominal aortic aneurysm repair      Jarold Motto MS, RD, LDN Inpatient Registered Dietitian Pager: [phone] After-hours pager: [phone]

## 2013-05-27 NOTE — Progress Notes (Signed)
EEG completed; results pending.    

## 2013-05-27 NOTE — Progress Notes (Signed)
Called ED for pt report. Awaiting nurse to call back.

## 2013-05-28 DIAGNOSIS — N2889 Other specified disorders of kidney and ureter: Secondary | ICD-10-CM

## 2013-05-28 DIAGNOSIS — F329 Major depressive disorder, single episode, unspecified: Secondary | ICD-10-CM

## 2013-05-28 DIAGNOSIS — F3289 Other specified depressive episodes: Secondary | ICD-10-CM

## 2013-05-28 DIAGNOSIS — I6783 Posterior reversible encephalopathy syndrome: Secondary | ICD-10-CM

## 2013-05-28 DIAGNOSIS — R569 Unspecified convulsions: Secondary | ICD-10-CM | POA: Diagnosis present

## 2013-05-28 LAB — CBC
HCT: 37.3 % (ref 36.0–46.0)
Hemoglobin: 12.9 g/dL (ref 12.0–15.0)
MCH: 30.1 pg (ref 26.0–34.0)
MCHC: 34.6 g/dL (ref 30.0–36.0)
MCV: 87.1 fL (ref 78.0–100.0)
Platelets: 277 10*3/uL (ref 150–400)
RBC: 4.28 MIL/uL (ref 3.87–5.11)
RDW: 13.2 % (ref 11.5–15.5)
WBC: 5.5 10*3/uL (ref 4.0–10.5)

## 2013-05-28 LAB — BASIC METABOLIC PANEL
BUN: 14 mg/dL (ref 6–23)
CO2: 27 mEq/L (ref 19–32)
CREATININE: 1.33 mg/dL — AB (ref 0.50–1.10)
Calcium: 10 mg/dL (ref 8.4–10.5)
Chloride: 103 mEq/L (ref 96–112)
GFR calc non Af Amer: 41 mL/min — ABNORMAL LOW (ref >90)
GFR, EST AFRICAN AMERICAN: 47 mL/min — AB (ref >90)
Glucose, Bld: 90 mg/dL (ref 70–99)
Potassium: 3.8 mEq/L (ref 3.7–5.3)
Sodium: 143 mEq/L (ref 137–147)

## 2013-05-28 NOTE — Progress Notes (Signed)
Patient was very drowsy during first several hours of this shift. She had difficulty with word recall and delayed responses. After sleeping for most of the shift she began to be more alert and answered questions more easily. She still had some difficulty with words but was able to answer more of the orientation questions and make more conversation in general. Will continue to monitor.

## 2013-05-28 NOTE — Procedures (Addendum)
ELECTROENCEPHALOGRAM REPORT   Patient: Amy Bates       Room #: 1O105W28 EEG No. ID: [id number] Age: 68 y.o.        Sex: female Referring Physician: Ghimire Report Date:  05/27/2013        Interpreting Physician: Thana FarrEYNOLDS,Adiva Boettner D  History: Amy QueenDiane Garry is an 68 y.o. female presenting with new onset seizures  Medications:  Scheduled: . ARIPiprazole  2 mg Oral Daily  . atorvastatin  40 mg Oral Daily  . fenofibrate  160 mg Oral Daily  . lactose free nutrition  237 mL Oral BID BM  . levothyroxine  75 mcg Oral QAC breakfast  . pantoprazole  40 mg Oral Daily  . sodium chloride  3 mL Intravenous Q12H  . tiotropium  18 mcg Inhalation Daily    Conditions of Recording:  This is a 16 channel EEG carried out with the patient in the awake state.  Description:  The waking background activity is slow and consists of a low to moderate voltage mixture of poorly organized delta and theta activity.  The posterior background rhythm is slow as well with a maximum frequency noted of 6 hertz theta activity.  The background activity is continuous.  No epileptiform activity is noted.   The patient does not drowse or sleep. Hyperventilation and intermittent photic stimulation were not performed.   IMPRESSION: This is an abnormal EEG secondary to general background slowing.  This finding may be seen with a diffuse disturbance that is etiologically nonspecific, but may include a metabolic encephalopathy, among other possibilities.  No epileptiform activity was noted.     Thana FarrLeslie Reshawn Ostlund, MD [facility] [phone] 05/27/2013, 7:39 PM

## 2013-05-28 NOTE — Evaluation (Signed)
Physical Therapy Evaluation Patient Details Name: Amy QueenDiane Bates MRN: [id number] DOB: 08/31/1945 Today's Date: 05/28/2013 Time: [id number] PT Time Calculation (min): 22 min  PT Assessment / Plan / Recommendation History of Present Illness  68 yo female with h/o memory loss for 6 months, copd, AAA repair oct 14, comes in after falling at home.  Pt states she lost her balance and fell and hit her head.  Denies loc.  Easily confused.  Per ED report she is normally confused easily, and this is her baseline mental status per family report.  Family has left.  Pt denies any recent illnessess.  No n/v/d.  No headache.  Her w/u has been neg in ED except ct head with ?subdural hematoma.  Asked to obs to repeat ct head later.  Pt has no focal neurological symptoms at this time except she does easily get confused.  Clinical Impression  Pt moving much better today and seems to be nearing her baseline.  Pt to D/C home with husband today.      PT Assessment       Follow Up Recommendations  Home health PT;Supervision/Assistance - 24 hour    Does the patient have the potential to tolerate intense rehabilitation      Barriers to Discharge        Equipment Recommendations  None recommended by PT    Recommendations for Other Services     Frequency Min 3X/week    Precautions / Restrictions Precautions Precautions: Fall Restrictions Weight Bearing Restrictions: No   Pertinent Vitals/Pain Denied pain.        Mobility  Bed Mobility Overal bed mobility: Modified Independent Transfers Overall transfer level: Needs assistance Equipment used: Rolling walker (2 wheeled) Transfers: Sit to/from Stand Sit to Stand: Supervision General transfer comment: pt demos good use of UEs.   Ambulation/Gait Ambulation/Gait assistance: Supervision Ambulation Distance (Feet): 250 Feet Assistive device: Rolling walker (2 wheeled) Gait Pattern/deviations: Step-through pattern;Decreased stride length;Trunk  flexed Gait velocity interpretation: Below normal speed for age/gender General Gait Details: pt moves slowly and leans on RW.  Per husband pt has been doing this at home.      Exercises     PT Diagnosis:    PT Problem List:   PT Treatment Interventions:       PT Goals(Current goals can be found in the care plan section) Acute Rehab PT Goals Patient Stated Goal: husband wants pt to get stronger Time For Goal Achievement: 06/03/13 Potential to Achieve Goals: Good  Visit Information  Last PT Received On: 05/28/13 Assistance Needed: +1 History of Present Illness: 68 yo female with h/o memory loss for 6 months, copd, AAA repair oct 14, comes in after falling at home.  Pt states she lost her balance and fell and hit her head.  Denies loc.  Easily confused.  Per ED report she is normally confused easily, and this is her baseline mental status per family report.  Family has left.  Pt denies any recent illnessess.  No n/v/d.  No headache.  Her w/u has been neg in ED except ct head with ?subdural hematoma.  Asked to obs to repeat ct head later.  Pt has no focal neurological symptoms at this time except she does easily get confused.       Prior Functioning       Cognition  Cognition Arousal/Alertness: Awake/alert Behavior During Therapy: WFL for tasks assessed/performed;Flat affect Overall Cognitive Status: History of cognitive impairments - at baseline    Extremity/Trunk Assessment  Balance    End of Session PT - End of Session Equipment Utilized During Treatment: Gait belt Activity Tolerance: Patient tolerated treatment well Patient left: in bed;with call bell/phone within reach;with family/visitor present;with bed alarm set Nurse Communication: Mobility status  GP     Sunny Schlein, [place] [phone] 05/28/2013, 3:58 PM

## 2013-05-28 NOTE — Progress Notes (Signed)
   CARE MANAGEMENT NOTE 05/28/2013  Patient:  Amy Bates,Amy Bates   Account Number:  [id number]  Date Initiated:  05/28/2013  Documentation initiated by:  [facility]Kashlyn [facility]  Subjective/Objective Assessment:   adm:  Fall at home     Action/Plan:   discharge planning   Anticipated DC Date:  05/28/2013   Anticipated DC Plan:  HOME W HOME HEALTH SERVICES      DC Planning Services  CM consult      [facility]   Choice offered to / List presented to:  C-1 Patient        HH arranged  HH-2 PT      [facility]   Status of service:  Completed, signed off Medicare Important Message given?   (If response is "NO", the following Medicare IM given date fields will be blank) Date Medicare IM given:   Date Additional Medicare IM given:    Discharge Disposition:  HOME W HOME HEALTH SERVICES  Per UR Regulation:    If discussed at Long Length of Stay Meetings, dates discussed:    Comments:  05/28/13 15:25 CM spoke with pt for choice for HHPT services.  Pt wishes to have [facility] to render HHPT.  Address and contact number verified with pt.  [facility] [place].  No DME needed.  No other CM needs were communicated.  Freddy JakschSarah Macaulay Reicher, BSN, CM [phone].

## 2013-05-28 NOTE — Progress Notes (Signed)
05/28/13 Patient being discharged home with husband, will [facility]. IV site removed and discharge instructions reviewed with patient and husband.

## 2013-05-28 NOTE — Discharge Summary (Signed)
PATIENT DETAILS Name: Amy Bates Age: 68 y.o. Sex: female Date of Birth: 07/24/1945 MRN: [id number]. Admit Date: 05/26/2013 Admitting Physician: Haydee Monicaachal A David, MD GEX:[id number]:Katherine Beverely Lowabori, MD  Recommendations for Outpatient Follow-up:  Mental status significantly better after stopping Wellbutrin, not resuming on discharge.Will need close follow up of clinical course  PRIMARY DISCHARGE DIAGNOSIS:  Principal Problem:   Fall at home Active Problems:   PRES   Seizure   HYPERLIPIDEMIA   COPD   Memory loss   AAA (abdominal aortic aneurysm)   Altered mental status      PAST MEDICAL HISTORY: Past Medical History  Diagnosis Date  . Arthritis   . Allergic rhinitis   . Asthma   . Depression   . GERD (gastroesophageal reflux disease)   . Hyperlipidemia   . Urinary incontinence   . History of chicken pox   . COPD (chronic obstructive pulmonary disease)   . Pneumonia   . Chest pain     [facility]  . Hypothyroid     DISCHARGE MEDICATIONS:   Medication List    STOP taking these medications       buPROPion 150 MG 24 hr tablet  Commonly known as:  WELLBUTRIN XL      TAKE these medications       albuterol 108 (90 BASE) MCG/ACT inhaler  Commonly known as:  PROVENTIL HFA;VENTOLIN HFA  Inhale 2 puffs into the lungs every 6 (six) hours as needed for wheezing.     ARIPiprazole 2 MG tablet  Commonly known as:  ABILIFY  Take 1 tablet (2 mg total) by mouth daily.     atorvastatin 40 MG tablet  Commonly known as:  LIPITOR  Take 1 tablet (40 mg total) by mouth daily.     fenofibrate 160 MG tablet  Take 160 mg by mouth daily.     guaifenesin 400 MG Tabs tablet  Commonly known as:  HUMIBID E  Take 400 mg by mouth every 4 (four) hours as needed (for congestion).     levothyroxine 75 MCG tablet  Commonly known as:  SYNTHROID, LEVOTHROID  Take 75 mcg by mouth daily before breakfast.     mometasone 220 MCG/INH inhaler  Commonly known as:  ASMANEX  Inhale 2 puffs  into the lungs daily.     pantoprazole 40 MG tablet  Commonly known as:  PROTONIX  Take 1 tablet (40 mg total) by mouth daily.     polyethylene glycol packet  Commonly known as:  MIRALAX / GLYCOLAX  Take 17 g by mouth 2 (two) times daily as needed (for constipation).     tiotropium 18 MCG inhalation capsule  Commonly known as:  SPIRIVA  Place 18 mcg into inhaler and inhale daily.        ALLERGIES:   Allergies  Allergen Reactions  . Ibuprofen Other (See Comments)    Patient has a history of ulcers    BRIEF HPI:  See H&P, Labs, Consult and Test reports for all details in brief, patient was admitted for fall and subsequent witnessed seizure like activity. She was then admitted for further evaluation and treatment.  CONSULTATIONS:   neurology  PERTINENT RADIOLOGIC STUDIES: Dg Chest 2 View  05/26/2013   CLINICAL DATA:  Fall, altered mental status  EXAM: CHEST  2 VIEW  COMPARISON:  03/15/2013  FINDINGS: Interstitial coarsening/ COPD. Apical scarring, left greater than right. No confluent airspace opacity, pleural effusion, or pneumothorax. Heart size within normal range. Atherosclerotic vascular calcifications and aortic tortuosity.  Partially imaged aortic endograft within the abdomen. Diffuse osteopenia limits evaluation of the thoracic spine.  IMPRESSION: COPD.  No focal consolidation.   Electronically Signed   By: Jearld Lesch M.D.   On: 05/26/2013 22:19   Dg Lumbar Spine Complete  05/26/2013   CLINICAL DATA:  Lower back pain after fall.  EXAM: LUMBAR SPINE - COMPLETE 4+ VIEW  COMPARISON:  None.  FINDINGS: Status post endograft repair of infrarenal abdominal aortic aneurysm. No fracture or significant spondylolisthesis is noted. Disc spaces are well-maintained. Diffuse osteopenia is noted. Posterior facet joints appear normal.  IMPRESSION: No significant abnormality seen in the lumbar spine.   Electronically Signed   By: Roque Lias M.D.   On: 05/26/2013 22:44   Dg Hip  Bilateral W/pelvis  05/26/2013   CLINICAL DATA:  Pain after fall.  EXAM: BILATERAL HIP WITH PELVIS - 4+ VIEW  COMPARISON:  None.  FINDINGS: The hip and sacroiliac joints appear normal. No fracture or dislocation is seen.  IMPRESSION: Normal pelvis.   Electronically Signed   By: Roque Lias M.D.   On: 05/26/2013 22:47   Ct Head Wo Contrast  05/27/2013   CLINICAL DATA:  Fall.  Mammary loss per for 6 months.  EXAM: CT HEAD WITHOUT CONTRAST  TECHNIQUE: Contiguous axial images were obtained from the base of the skull through the vertex without intravenous contrast.  COMPARISON:  CT HEAD W/O CM dated 05/26/2013; CT HEAD W/O CM dated 03/15/2013; CT C SPINE W/O CM dated 05/26/2013; CT ANGIO HEAD W/CM &/OR WO/CM dated 04/08/2013  FINDINGS: The patient has a known 3 mm left MCA aneurysm which is not readily apparent on today's exam but which was shown on the prior CT angiogram of 04/08/2013.  Accentuated white matter hypodensity in the cerebrum is diffuse but with some mild posterior confluence. This has been present over the past year but appears progressive. Comparing back to 03/15/2013, there is also a reduced prominence of the cerebral sulci.  There is a right posterior vertex scalp hematoma. No definite acute intracranial hemorrhage is identified. No intracranial hemorrhage is confirmed on today's exam. No definite mass lesion. No specific findings of acute CVA.  IMPRESSION: 1. Confluent generalized white matter hypodensities in the cerebrum. Although likely mostly due to chronic ischemic microvascular white matter disease, the reduced conspicuity of sulci over the past 3 months, for example along the cerebral vertex, as well as the posterior confluence of hypodensity raise the possibility of superimposed posterior reversible encephalopathy (PRES). 2. No compelling findings of intracranial hemorrhage. 3. The patient has a known 3 mm left MCA aneurysm, not readily seen on today's exam but shown on the prior CT angiogram  of 04/08/2013. 4. Right posterior vertex scalp hematoma   Electronically Signed   By: Herbie Baltimore M.D.   On: 05/27/2013 09:25   Ct Head Wo Contrast  05/26/2013   CLINICAL DATA:  Fall, head trauma.  EXAM: CT HEAD WITHOUT CONTRAST  CT CERVICAL SPINE WITHOUT CONTRAST  TECHNIQUE: Multidetector CT imaging of the head and cervical spine was performed following the standard protocol without intravenous contrast. Multiplanar CT image reconstructions of the cervical spine were also generated.  COMPARISON:  04/08/2013  FINDINGS: CT HEAD FINDINGS  Degraded by unconventional positioning, which may obscure subtle pathology. Within this limitation; advanced periventricular and subcortical white matter hypodensities, a nonspecific finding often seen in the setting of chronic microangiopathic change. No overt hydrocephalus or acute infarction. No intraparenchymal hemorrhage, mass, mass effect. Thin isodense subdurals difficult to  exclude for example image 19 on the right and image 17 on the left series 2. No significant mass effect or midline shift. No displaced calvarial fracture. Right posterior scalp hematoma.  CT CERVICAL SPINE FINDINGS  Apical emphysema and scarring. Osteopenia. Maintained craniocervical relationship. No dens fracture. Maintained vertebral body height and alignment. Multilevel degenerative change, most pronounced at C6-7. Paravertebral soft tissues within normal limits.  IMPRESSION: Degraded by unconventional positioning. Thin isodense subdurals are difficult to exclude. No significant mass effect. Recommend short-term follow-up CT or MRI.  Right posterior scalp hematoma.  No underlying calvarial fracture.  White matter hypodensities are similar to prior, a nonspecific finding often seen in the setting of chronic microangiopathic change.  Mild multilevel degenerative changes of the cervical spine. No acute osseous finding.  Critical Value/emergent results were called by telephone at the time of  interpretation on 05/26/2013 at 10:49 PM to Dr. Raymon Mutton , who verbally acknowledged these results.   Electronically Signed   By: Jearld Lesch M.D.   On: 05/26/2013 22:53   Ct Cervical Spine Wo Contrast  05/26/2013   CLINICAL DATA:  Fall, head trauma.  EXAM: CT HEAD WITHOUT CONTRAST  CT CERVICAL SPINE WITHOUT CONTRAST  TECHNIQUE: Multidetector CT imaging of the head and cervical spine was performed following the standard protocol without intravenous contrast. Multiplanar CT image reconstructions of the cervical spine were also generated.  COMPARISON:  04/08/2013  FINDINGS: CT HEAD FINDINGS  Degraded by unconventional positioning, which may obscure subtle pathology. Within this limitation; advanced periventricular and subcortical white matter hypodensities, a nonspecific finding often seen in the setting of chronic microangiopathic change. No overt hydrocephalus or acute infarction. No intraparenchymal hemorrhage, mass, mass effect. Thin isodense subdurals difficult to exclude for example image 19 on the right and image 17 on the left series 2. No significant mass effect or midline shift. No displaced calvarial fracture. Right posterior scalp hematoma.  CT CERVICAL SPINE FINDINGS  Apical emphysema and scarring. Osteopenia. Maintained craniocervical relationship. No dens fracture. Maintained vertebral body height and alignment. Multilevel degenerative change, most pronounced at C6-7. Paravertebral soft tissues within normal limits.  IMPRESSION: Degraded by unconventional positioning. Thin isodense subdurals are difficult to exclude. No significant mass effect. Recommend short-term follow-up CT or MRI.  Right posterior scalp hematoma.  No underlying calvarial fracture.  White matter hypodensities are similar to prior, a nonspecific finding often seen in the setting of chronic microangiopathic change.  Mild multilevel degenerative changes of the cervical spine. No acute osseous finding.  Critical  Value/emergent results were called by telephone at the time of interpretation on 05/26/2013 at 10:49 PM to Dr. Raymon Mutton , who verbally acknowledged these results.   Electronically Signed   By: Jearld Lesch M.D.   On: 05/26/2013 22:53     PERTINENT LAB RESULTS: CBC:  Recent Labs  05/26/13 2340 05/28/13 0649  WBC 11.5* 5.5  HGB 13.8 12.9  HCT 39.2 37.3  PLT 251 277   CMET CMP     Component Value Date/Time   NA 143 05/28/2013 0649   K 3.8 05/28/2013 0649   CL 103 05/28/2013 0649   CO2 27 05/28/2013 0649   GLUCOSE 90 05/28/2013 0649   GLUCOSE 94 01/14/2010   BUN 14 05/28/2013 0649   CREATININE 1.33* 05/28/2013 0649   CALCIUM 10.0 05/28/2013 0649   PROT 7.4 05/26/2013 2340   ALBUMIN 4.1 05/26/2013 2340   AST 24 05/26/2013 2340   ALT 11 05/26/2013 2340   ALKPHOS 53 05/26/2013 2340  BILITOT 0.5 05/26/2013 2340   GFRNONAA 41* 05/28/2013 0649   GFRAA 47* 05/28/2013 0649    GFR Estimated Creatinine Clearance: 39 ml/min (by C-G formula based on Cr of 1.33). No results found for this basename: LIPASE, AMYLASE,  in the last 72 hours  Recent Labs  05/26/13 2340  TROPONINI <0.30   No components found with this basename: POCBNP,  No results found for this basename: DDIMER,  in the last 72 hours No results found for this basename: HGBA1C,  in the last 72 hours No results found for this basename: CHOL, HDL, LDLCALC, TRIG, CHOLHDL, LDLDIRECT,  in the last 72 hours No results found for this basename: TSH, T4TOTAL, FREET3, T3FREE, THYROIDAB,  in the last 72 hours No results found for this basename: VITAMINB12, FOLATE, FERRITIN, TIBC, IRON, RETICCTPCT,  in the last 72 hours Coags:  Recent Labs  05/26/13 2340  INR 0.99   Microbiology: No results found for this or any previous visit (from the past 240 hour(s)).   BRIEF HOSPITAL COURSE:  ?Subdural Hematoma  - Initial CT scan of the head done on admission (3/12) showed a questionable subdural hematoma, however a repeat CT scan  done on 3/13 negative for subdural hematoma. Suspect initial CT head was artifact.  Suspected PRES  - Repeat CT scan of the head done on 3/13 showed suspected PRES. Reported of seizure-like activity yesterday after she fell,? Could be precipitated by PRES. Etiology not evident, blood pressure seems to be stable. Neurology evaluation completed while inpatient, suspect seizure could have been precipitated by Wellbutrin. This was subsequently stopped, patient has had a marked improvement in mental status this am, completely awake and alert, significant improvement in her speech. Stable for discharge.   Seizure-like activity  - Family reports seizure-like activity after patient fell on 3/12. EEG done, no epileptic foci seen. Seen by Neurology, current suspicion is that these reported seizure could be precipitated by Wellbutrin.No further seizure-like activities seem since hospitalization. No antiepileptics indicated at this tim  Mechanical Fall with right posterior scalp hematoma  -PT evaluation done, family able to provide care at home, will order HHPT services -Xrays are negative.   Confusion and memory loss.  -Started after AAA repair 6 months ago,being worked up outpatient.Has seen Dr. Everlena Cooper, Neurology outpatient.  -SXS being attributed to depression and dementia. Placed on Celexa and Abilify by PCP.  -However remarkable improvement in her mental status today, following discontinuation of Wellbutrin. Have asked family to continue follow up with PCP, primary Neurologist and primary Psychiatrist for continued care.  Leukocytosis  -Likely stress margination, has resolved. -U/A is clear,-CXR is neg for infection,was monitored off antibiotics   COPD  -Oxygen sats are good on room air. Lungs are clear  - stable at discharge  Right hip pain  -Xray negative for fracture  -Presumed secondary to fall  -Supportive care.   AAA  -Appears stable s/p repair. No complaints of chest pain.    Hypothyroidism  - Continue with levothyroxine   Dyslipidemia  - c/w statins and fenofibrate  TODAY-DAY OF DISCHARGE:  Subjective:   Gwendolen Hewlett today has no headache,no chest abdominal pain,no new weakness tingling or numbness, feels much better wants to go home today.   Objective:   Blood pressure 126/80, pulse 79, temperature 98.6 F (37 C), temperature source Oral, resp. rate 15, height 5\' 7"  (1.702 m), weight 59.3 kg (130 lb 11.7 oz), SpO2 93.00%.  Intake/Output Summary (Last 24 hours) at 05/28/13 1137 Last data filed at 05/27/13  2022  Gross per 24 hour  Intake      3 ml  Output      0 ml  Net      3 ml   Filed Weights   05/27/13 0313  Weight: 59.3 kg (130 lb 11.7 oz)    Exam Awake Alert, Oriented *3, No new F.N deficits, Normal affect Alex.AT,PERRAL Supple Neck,No JVD, No cervical lymphadenopathy appriciated.  Symmetrical Chest wall movement, Good air movement bilaterally, CTAB RRR,No Gallops,Rubs or new Murmurs, No Parasternal Heave +ve B.Sounds, Abd Soft, Non tender, No organomegaly appriciated, No rebound -guarding or rigidity. No Cyanosis, Clubbing or edema, No new Rash or bruise  DISCHARGE CONDITION: Stable  DISPOSITION: Home with home health services  DISCHARGE INSTRUCTIONS:    Activity:  As tolerated with Full fall precautions use walker/cane & assistance as needed  Diet recommendation: Heart Healthy diet      Discharge Orders   Future Appointments Provider Department Dept Phone   08/15/2013 9:30 AM [facility] [place] [facility] [address] [facility] [phone]   08/15/2013 10:30 AM [facility] [place] [facility] [address] [phone]   Eat a light meal the night before the exam Nothing to eat or drink for at least 8 hours before the exam No gum chewing, or smoking the morning of the exam Please take your morning medications with small sips of water, especially blood pressure medication *Very Important* Please wear 2 piece clothing.    08/15/2013 11:00 AM Nada Libman, MD [facility] [phone]   10/04/2013 8:30 AM Adam Gus Rankin, DO [facility] [phone]   Future Orders Complete By Expires   Call MD for:  persistant dizziness or light-headedness  As directed    Diet - low sodium heart healthy  As directed    Increase activity slowly  As directed       Total Time spent on discharge equals 45 minutes.  SignedJeoffrey Massed 05/28/2013 11:37 AM

## 2013-06-07 ENCOUNTER — Ambulatory Visit (INDEPENDENT_AMBULATORY_CARE_PROVIDER_SITE_OTHER): Payer: Medicare HMO | Admitting: Family Medicine

## 2013-06-07 ENCOUNTER — Encounter: Payer: Self-pay | Admitting: Family Medicine

## 2013-06-07 VITALS — BP 140/98 | HR 98 | Resp 17 | Wt 128.0 lb

## 2013-06-07 DIAGNOSIS — I6783 Posterior reversible encephalopathy syndrome: Secondary | ICD-10-CM

## 2013-06-07 DIAGNOSIS — G9349 Other encephalopathy: Secondary | ICD-10-CM

## 2013-06-07 NOTE — Patient Instructions (Signed)
[facility] go [facility] to [facility] for complete evaluation This is a HUGE change from 6 weeks ago Call with any questions or concerns Hang in there!!!

## 2013-06-07 NOTE — Assessment & Plan Note (Signed)
New to provider.  Pt is severely impaired today- unable to respond verbally or rise from seated position.  Husband and sister are appropriately very concerned.  This is a VERY different pt than the one who was seen 6 weeks ago in office.  Initial thought was that wellbutrin lowered seizure threshold and precipitated confusion- this is clearly not the case (although it is easy to see why they would think this if mental status is fluctuating and had improved on day of D/C).  Recommended today that pt immediately go to [facility] for thorough evaluation.  Husband and sister in agreement.  40 min spent w/ pt, >50% spent counseling family.

## 2013-06-07 NOTE — Progress Notes (Signed)
   Subjective:    Patient ID: Amy Bates, female    DOB: 03/11/1946, 68 y.o.   MRN: [id number]  HPI Hospital f/u- pt was admitted 3/12 after altered MS episode and fall.  The dx at discharge was that her issues were due to the Wellbutrin and this was stopped.  Pt unable to verbalize what happened to her or why she went to the hospital.  Unable to answer questions in the office today- no language fluency, word finding difficulty.  Husband and sister are present today.  Husband reports that sxs started after pt fell and hit the back of her head on curb, 'stopped breathing and was turning blue' and then had seizure.  911 was called.  Daughter and neighbor started CPR.  Husband reports that pt is unable to find restroom, was attempting to sit on edge of bed to urinate.  Confusion is severe.  Unable to speak in sentences for over a month.     Review of Systems For ROS see HPI     Objective:   Physical Exam  Vitals reviewed. Constitutional:  Frail, elderly woman  HENT:  Head: Normocephalic and atraumatic.  Cardiovascular: Normal rate, regular rhythm, normal heart sounds and intact distal pulses.   Pulmonary/Chest: Effort normal and breath sounds normal. No respiratory distress. She has no wheezes. She has no rales.  Musculoskeletal: She exhibits no edema.  Neurological:  Unable to speak other than 1 word utterances- no verbal fluency, word finding difficulties.  Was able to follow simple command- put on glasses, put tissues on table Unable to rise from sitting position  Skin: Skin is warm and dry.  Psychiatric:  Cried throughout the visit          Assessment & Plan:

## 2013-06-07 NOTE — Progress Notes (Signed)
Pre visit review using our clinic review tool, if applicable. No additional management support is needed unless otherwise documented below in the visit note. 

## 2013-06-14 ENCOUNTER — Encounter: Payer: Self-pay | Admitting: *Deleted

## 2013-06-21 DIAGNOSIS — S069XAA Unspecified intracranial injury with loss of consciousness status unknown, initial encounter: Secondary | ICD-10-CM | POA: Insufficient documentation

## 2013-06-21 DIAGNOSIS — S069X9A Unspecified intracranial injury with loss of consciousness of unspecified duration, initial encounter: Secondary | ICD-10-CM | POA: Insufficient documentation

## 2013-06-23 ENCOUNTER — Telehealth: Payer: Self-pay

## 2013-06-23 NOTE — Telephone Encounter (Signed)
Spoke with patient's husband, Mr. Space.  He states that patient is currently [facility] [place].  According to Mr. Font, patient is not doing well.  She is confused, able to ambulate, but needs assistance.  She will not be coming in for a hospital follow-up appointment at this time.

## 2013-06-23 NOTE — Telephone Encounter (Signed)
Left message for call back Non identifiable  

## 2013-07-07 ENCOUNTER — Telehealth: Payer: Self-pay

## 2013-07-07 NOTE — Telephone Encounter (Signed)
Husband, Amy Bates, provided the following information:  Patient was admitted to [facility] on 06/08/13 and was discharged on 07/05/13.  According to her husband patient is doing well at home.  She's eating well and is getting around some but requires the assistance of her husband.  She does not require an assistive walking device at this time.  However, she needs some assistance with ADLS.  Husband has purchased a special shower chair, shower wand, and non-slip mat.    Plan of Care: [facility] for [facility]/OT/PT Neurology appointment  Medication and allergies:  Reviewed and updated New Medications: discussed with husband Local pharmacy:  [facility] [phone] - [place], [place] - [address] [facility] [address] [facility] [address] Personal, family and past surgical hx:  Review and updated  Patient is currently not having any issues, with the exception occasional headaches with overexertion and speech therapy.    Hospital follow-up appt scheduled for 07/11/13 @ 1130.

## 2013-07-11 ENCOUNTER — Encounter: Payer: Self-pay | Admitting: Family Medicine

## 2013-07-11 ENCOUNTER — Ambulatory Visit (INDEPENDENT_AMBULATORY_CARE_PROVIDER_SITE_OTHER): Payer: Medicare HMO | Admitting: Family Medicine

## 2013-07-11 VITALS — BP 130/70 | HR 78 | Temp 98.2°F | Resp 12 | Wt 130.0 lb

## 2013-07-11 DIAGNOSIS — I6783 Posterior reversible encephalopathy syndrome: Secondary | ICD-10-CM

## 2013-07-11 NOTE — Progress Notes (Signed)
Pre visit review using our clinic review tool, if applicable. No additional management support is needed unless otherwise documented below in the visit note. 

## 2013-07-11 NOTE — Progress Notes (Signed)
   Subjective:    Patient ID: Amy Bates, female    DOB: 11/18/1945, [age] y.o.   MRN: [id number]  HPI Hospital F/U- [facility] 3/24.  Admitted and subsequently d/c'd on 4/21.  Pt was dx'd w/ PRES and evaluated by [facility].  Pt is due to have ST, PT, OT and this was supposed to be transferred to [facility] but as of yet, pt has not received a call to set up appt.  Does not have Neurology f/u scheduled- needs appt (has seen Dr Everlena CooperJaffe previously).  Now on Sinemet and Seroquel, also on B12 and B1.  Also on Zoloft.  Pt reports feeling 'fair'.  Tearfulness is improving.  Pt is now walking, talking- which is a HUGE improvement since last visit.  Husband reports family dynamic is difficult b/c he's trying to push her to do the appropriate rehab.  Pt reports feeling steady on her feet.  Still having some difficulty w/ word finding.     Review of Systems For ROS see HPI     Objective:   Physical Exam  Vitals reviewed. Constitutional: She appears well-developed and well-nourished.  HENT:  Head: Normocephalic and atraumatic.  Eyes: Pupils are equal, round, and reactive to light.  Neck: Normal range of motion. Neck supple.  Cardiovascular: Normal rate, regular rhythm, normal heart sounds and intact distal pulses.   Pulmonary/Chest: Effort normal and breath sounds normal. No respiratory distress. She has no wheezes. She has no rales.  Musculoskeletal: She exhibits no edema.  Lymphadenopathy:    She has no cervical adenopathy.  Neurological: She is alert.  Still mild difficulty w/ word finding  Psychiatric:  Intermittently tearful          Assessment & Plan:

## 2013-07-11 NOTE — Assessment & Plan Note (Signed)
Improved after recent hospitalization at [facility].  Pt still needs to be set up w/ PT, OT, ST- referrals made.  Also encouraged pt to f/u w/ local neuro for ongoing management.  Applauded her efforts at recovery b/c she is walking and talking today w/ minimal difficulty- something she was unable to do at last visit.  Will continue to follow closely.

## 2013-07-11 NOTE — Patient Instructions (Signed)
Follow up in 3 months to recheck BP, cholesterol We'll call you with your Speech, Occupational, and Physical therapy appts Someone will call you with your neurology appt Your BP should range between 90/50-145/90.  A few outliers are ok but the majority of the readings should stay between these guidelines Call with any questions or concerns Keep up the good work!!!  You look SO much better!

## 2013-07-14 ENCOUNTER — Ambulatory Visit: Payer: Medicare HMO | Attending: Family Medicine | Admitting: Occupational Therapy

## 2013-07-14 ENCOUNTER — Ambulatory Visit: Payer: Medicare HMO | Admitting: Physical Therapy

## 2013-07-14 ENCOUNTER — Ambulatory Visit: Payer: Medicare HMO

## 2013-07-14 DIAGNOSIS — M6281 Muscle weakness (generalized): Secondary | ICD-10-CM | POA: Diagnosis not present

## 2013-07-14 DIAGNOSIS — R4701 Aphasia: Secondary | ICD-10-CM | POA: Insufficient documentation

## 2013-07-14 DIAGNOSIS — Z5189 Encounter for other specified aftercare: Secondary | ICD-10-CM | POA: Diagnosis not present

## 2013-07-14 DIAGNOSIS — R269 Unspecified abnormalities of gait and mobility: Secondary | ICD-10-CM | POA: Insufficient documentation

## 2013-07-18 ENCOUNTER — Ambulatory Visit: Payer: Medicare HMO | Attending: Family Medicine | Admitting: Physical Therapy

## 2013-07-18 DIAGNOSIS — R4701 Aphasia: Secondary | ICD-10-CM | POA: Insufficient documentation

## 2013-07-18 DIAGNOSIS — R269 Unspecified abnormalities of gait and mobility: Secondary | ICD-10-CM | POA: Insufficient documentation

## 2013-07-18 DIAGNOSIS — M6281 Muscle weakness (generalized): Secondary | ICD-10-CM | POA: Insufficient documentation

## 2013-07-18 DIAGNOSIS — G3184 Mild cognitive impairment, so stated: Secondary | ICD-10-CM | POA: Insufficient documentation

## 2013-07-18 DIAGNOSIS — Z5189 Encounter for other specified aftercare: Secondary | ICD-10-CM | POA: Insufficient documentation

## 2013-07-18 DIAGNOSIS — G9349 Other encephalopathy: Secondary | ICD-10-CM | POA: Insufficient documentation

## 2013-07-22 ENCOUNTER — Ambulatory Visit: Payer: Medicare HMO | Admitting: Physical Therapy

## 2013-07-22 DIAGNOSIS — Z5189 Encounter for other specified aftercare: Secondary | ICD-10-CM | POA: Diagnosis not present

## 2013-07-25 ENCOUNTER — Ambulatory Visit: Payer: Medicare HMO | Admitting: Occupational Therapy

## 2013-07-25 ENCOUNTER — Ambulatory Visit: Payer: Medicare HMO

## 2013-07-25 ENCOUNTER — Ambulatory Visit: Payer: Medicare HMO | Admitting: Physical Therapy

## 2013-07-25 DIAGNOSIS — Z5189 Encounter for other specified aftercare: Secondary | ICD-10-CM | POA: Diagnosis not present

## 2013-07-26 ENCOUNTER — Ambulatory Visit (INDEPENDENT_AMBULATORY_CARE_PROVIDER_SITE_OTHER): Payer: Medicare HMO | Admitting: Neurology

## 2013-07-26 ENCOUNTER — Encounter: Payer: Self-pay | Admitting: Neurology

## 2013-07-26 VITALS — BP 130/60 | HR 70 | Temp 97.5°F | Resp 16 | Ht 67.0 in | Wt 130.7 lb

## 2013-07-26 DIAGNOSIS — G931 Anoxic brain damage, not elsewhere classified: Secondary | ICD-10-CM

## 2013-07-26 DIAGNOSIS — R51 Headache: Secondary | ICD-10-CM

## 2013-07-26 DIAGNOSIS — I6783 Posterior reversible encephalopathy syndrome: Secondary | ICD-10-CM

## 2013-07-26 DIAGNOSIS — G9349 Other encephalopathy: Secondary | ICD-10-CM

## 2013-07-26 NOTE — Patient Instructions (Signed)
1.  We will [facility] to [facility] to see Dr. Renne CriglerPharr and  Dr. Gaynelle AduAbou-Zeid as you were supposed to follow up with one of them. 2.  Follow up here in one months.

## 2013-07-26 NOTE — Progress Notes (Addendum)
NEUROLOGY FOLLOW UP OFFICE NOTE  Amy Bates [id number]  HISTORY OF PRESENT ILLNESS: Amy Bates is a 68 year old right-handed woman with history of COPD, hypothyroidism, hyperlipidemia, depression, renal artery occlusion, metal in jaw, and abdominal aortic aneurysm who follows up for headache and memory loss, as well as recent hospitalization for PRES.  She is accompanied by her husband.  Records and images were personally reviewed where available.  She was found to have a 5.4 x 4.8cm unruptured infrarenal abdominal aortic aneurysm after experiencing three weeks of abdominal pain.  She underwent endovascular repair and stenting on 01/11/13.    Since that time, she and her husband have noted periods of confusion.  She initially began having word-finding difficulties where she would lose track of thought and would fumble for the words.  She understands others and is able to read without difficulty.  This was intermittent but becoming more frequent.  Whenever she has word-finding difficulties, her husband will start asking her questions to test her.  She presented to the ED on 03/15/13 because she was not able to tell her husband the year or her daughters' names.  CT Head revealed chronic small vessel ischemic changes, but no evidence of acute infarct, mass lesion or hemorrhage.  She was diagnosed with an upper respiratory infection.  She also began experiencing bi-frontal and retro-orbital headaches, described as pounding, about 6/10 (not worse headache of her life) and not associated with nausea, vomiting, photophobia, phonophobia, or visual disturbance.  She denies neck or shoulder pain.  It would occur about 3 days a week and would last a couple of hours.  Onset seems to occur later in the day.  It is not positional.  She treats it with acetaminophen, which helps.  She denies vision problems.  She continued to have a deterioration of depressive symptoms.  She was placed on Abilify and her  psychiatrist later started Wellbutrin.  She was admitted to [facility] on 05/27/13 for altered mental status.  She had a fall.  There was mentioning of possible seizure activity, described as convulsive movements with frothing at the mouth and cyanosis of the lips.  Labs on admission revealed Na 136, K 3.5, Cl 95, bicarb 24, glucose 122, BUN 13, Cr 1.10, Ca 9.6, TP 7.4, Alb 4.1, AST 24, ALT 11, AP 53, TB 0.5, WBC 11.5, Hgb 13.8, Hct 39.2, PLT 251, Leukocytosis was thought to be secondary to fall.  Otherwise, labs did not reveal sign of infection.  She was not hypertensive.  Initial CT of the head showed possible thin isodense subdural hematoma, but was thought to be artifact, as repeat imaging did not reveal this.  However, the repeat CT of the head revealed confluent generalized white matter hypodensities in the cerebrum, with reduced conspicuity of the sulci when compared to prior CT on 03/15/13, thought was mentioned to be associated with PRES.  EEG revealed generalized background slowing but no seizure or epileptiform activity.  Wellbutrin was discontinued.    She was subsequently hospitalized at [facility] on 06/08/13 for worsening mental status and aphasia.  With her history of metal in her jaw, they used a low-magnet MRI of the brain with and without contrast, which revealed diffuse cerebral edema with central mass effect on the lateral ventricles, sequela of PRES versus encephalitis.  There was also evidence of acute lacunar infarct within the posterior left periventricular white matter.  She underwent long-term EEG monitoring which revealed generalized slowing but no seizures or epileptiform activity.  She  underwent workup for infectious, paraneoplastic, autoimmune and demyelinating etiologies.  She underwent LP, which revealed cell count 1, CMV PCR negative, EBV-PCR negative, VZV PCR negative, HSV PCR negative, gram stain and culture negative, cryptococal antigen negative, lactate 1.3, protein 87, glucose 63.   JCV antibody negative.  OCBs were noted in CSF and serum.  Other labs included NMDA receptor antibody negative, paraneoplastic panel negative.  B1 was 81.  ESR 20.  CRP 4.1.  Mg 1.8.  Phosphorus 3.5.  UA was positive.  CT of chest/abdomen/pelvis did not reveal cancer.  Blood pressure was managed, but she did not improve.  It was suspected that she may have an autoimmune encephalitis, so she was treated with Solumedrol for 3 days.  PLEX was considered but held because she improved on the steroids.  It was determined that she likely had either anoxic brain injury during her AAA surgery or an acquired leukodystrophy.  It was recommended that she follow-up with Neuroimmunology at [facility] for further workup regarding either a demyelinating disease or a leukodystrophy.  She was discharged to Rehab at [facility], where she showed much improvement.  Abilify was stopped.  She currently is on Zoloft, Seroquel and Sinemet.  She has a history of depression, but she has had significant increase in depression since the surgery, in which she easily cries.   Family history is significant for father with Alzheimer's disease and aunt with cerebral aneurysm.   PAST MEDICAL HISTORY: Past Medical History  Diagnosis Date  . Arthritis   . Allergic rhinitis   . Asthma   . Depression   . GERD (gastroesophageal reflux disease)   . Hyperlipidemia   . Urinary incontinence   . History of chicken pox   . COPD (chronic obstructive pulmonary disease)   . Pneumonia   . Chest pain     [facility]  . Hypothyroid   . PRES (posterior reversible encephalopathy syndrome)   . Constipation     MEDICATIONS: Current Outpatient Prescriptions on File Prior to Visit  Medication Sig Dispense Refill  . albuterol (PROAIR HFA) 108 (90 BASE) MCG/ACT inhaler Inhale 2 puffs into the lungs every 6 (six) hours as needed.      Marland Kitchen atorvastatin (LIPITOR) 40 MG tablet Take 1 tablet (40 mg total) by mouth daily.  90 tablet  3  .  carbidopa-levodopa (SINEMET IR) 25-250 MG per tablet Take 1 tablet by mouth 3 (three) times daily.      . fenofibrate 160 MG tablet Take 160 mg by mouth daily.      Marland Kitchen levothyroxine (SYNTHROID, LEVOTHROID) 75 MCG tablet Take 75 mcg by mouth daily before breakfast.      . pantoprazole (PROTONIX) 40 MG tablet Take 1 tablet (40 mg total) by mouth daily.  30 tablet  3  . polyethylene glycol (MIRALAX / GLYCOLAX) packet Take 17 g by mouth 2 (two) times daily as needed (for constipation).      . QUEtiapine (SEROQUEL) 50 MG tablet Take 50 mg by mouth every evening.      . sertraline (ZOLOFT) 50 MG tablet Take 50 mg by mouth daily.      Marland Kitchen thiamine 100 MG tablet Take 100 mg by mouth daily.      . vitamin B-12 (CYANOCOBALAMIN) 1000 MCG tablet Take 1,000 mcg by mouth daily.      . mometasone (ASMANEX) 220 MCG/INH inhaler Inhale 2 puffs into the lungs daily.      Marland Kitchen tiotropium (SPIRIVA) 18 MCG inhalation capsule Place 18 mcg  into inhaler and inhale daily.       No current facility-administered medications on file prior to visit.    ALLERGIES: Allergies  Allergen Reactions  . Ibuprofen Other (See Comments)    Patient has a history of ulcers    FAMILY HISTORY: Family History  Problem Relation Age of Onset  . Coronary artery disease Mother   . Stroke Mother   . Hypertension Neg Hx   . Diabetes Neg Hx   . Cancer Neg Hx   . Multiple sclerosis Sister     SOCIAL HISTORY: History   Social History  . Marital Status: Married    Spouse Name: N/A    Number of Children: 3  . Years of Education: N/A   Occupational History  . Retired Chief Operating Officer    Social History Main Topics  . Smoking status: Former Smoker    Start date: 01/11/2013  . Smokeless tobacco: Not on file  . Alcohol Use: No  . Drug Use: No  . Sexual Activity: Not on file   Other Topics Concern  . Not on file   Social History Narrative   Lives with mother-in-law.    REVIEW OF SYSTEMS: Constitutional: No fevers, chills, or  sweats, no generalized fatigue, change in appetite Eyes: No visual changes, double vision, eye pain Ear, nose and throat: No hearing loss, ear pain, nasal congestion, sore throat Cardiovascular: No chest pain, palpitations Respiratory:  No shortness of breath at rest or with exertion, wheezes GastrointestinaI: No nausea, vomiting, diarrhea, abdominal pain, fecal incontinence Genitourinary:  No dysuria, urinary retention or frequency Musculoskeletal:  No neck pain, back pain Integumentary: No rash, pruritus, skin lesions Neurological: as above Psychiatric: depression Endocrine: No palpitations, fatigue, diaphoresis, mood swings, change in appetite, change in weight, increased thirst Hematologic/Lymphatic:  No anemia, purpura, petechiae. Allergic/Immunologic: no itchy/runny eyes, nasal congestion, recent allergic reactions, rashes  PHYSICAL EXAM: Filed Vitals:   07/26/13 0757  BP: 130/60  Pulse: 70  Temp: 97.5 F (36.4 C)  Resp: 16   General: No acute distress Head:  Normocephalic/atraumatic Neck: supple, no paraspinal tenderness, full range of motion Heart:  Regular rate and rhythm Lungs:  Clear to auscultation bilaterally Back: No paraspinal tenderness Neurological Exam: alert and oriented to person, place, and time (except year and day of week, as she said Wednesday instead of Tuesday). Attention span and concentration somewhat impaired as she had trouble repeating list of numbers in reverse order, and unable to perform serial 7 subtraction, significant visuospatial and executive dysfunction (unable to complete Trail Making Test, copy a cube or draw a clock), remote memory intact, fund of knowledge intact.  Speech fluent and not dysarthric, Had trouble with repeating some more complex sentences.  Naming intact but sometimes inconsistent (initially called the rhinoceros a hippo but later corrected herself).  Vidette 12/30.  CN II-XII intact. Fundi not visualized.  Bulk and tone normal,  muscle strength 5/5 throughout.  Sensation to light touch intact.  Deep tendon reflexes 2+ throughout, toes downgoing.  Finger to nose intact.  Gait with normal stride, but some difficulty with tandem, Romberg negative.  IMPRESSION: Encephalopathy.  Differential includes perioperative anoxic brain injury during AAA repair versus adult onset or acquired leukodystrophy versus PRES  PLAN: I would first have her follow up with neuroimmunology at [facility] with Dr. Shelia Media and  Dr. George Hugh, for further workup  Follow up in 4 weeks to reassess.    45 minutes spent with patient and husband, over 50% spent reviewing the records, counseling  and coordinating care.  Metta Clines, DO  CC:  Annye Asa

## 2013-07-27 ENCOUNTER — Ambulatory Visit: Payer: Medicare HMO | Admitting: Occupational Therapy

## 2013-07-27 ENCOUNTER — Ambulatory Visit: Payer: Medicare HMO | Admitting: Physical Therapy

## 2013-07-27 ENCOUNTER — Ambulatory Visit: Payer: Medicare HMO

## 2013-07-27 DIAGNOSIS — Z5189 Encounter for other specified aftercare: Secondary | ICD-10-CM | POA: Diagnosis not present

## 2013-08-02 ENCOUNTER — Telehealth: Payer: Self-pay | Admitting: Family Medicine

## 2013-08-02 NOTE — Telephone Encounter (Signed)
Referral to see Dr. Myra GianottiBrabhamBerkley Harvey:  Auth #[id number] Valid 08/15/13-02/14/14 # visits: 6

## 2013-08-03 ENCOUNTER — Ambulatory Visit: Payer: Medicare HMO | Admitting: Occupational Therapy

## 2013-08-03 ENCOUNTER — Ambulatory Visit: Payer: Medicare HMO | Admitting: Physical Therapy

## 2013-08-03 ENCOUNTER — Ambulatory Visit: Payer: Medicare HMO

## 2013-08-03 DIAGNOSIS — G9349 Other encephalopathy: Secondary | ICD-10-CM

## 2013-08-03 DIAGNOSIS — Z5189 Encounter for other specified aftercare: Secondary | ICD-10-CM | POA: Diagnosis not present

## 2013-08-05 ENCOUNTER — Ambulatory Visit: Payer: Medicare HMO | Admitting: Occupational Therapy

## 2013-08-05 ENCOUNTER — Ambulatory Visit: Payer: Medicare HMO | Admitting: Physical Therapy

## 2013-08-05 ENCOUNTER — Ambulatory Visit: Payer: Medicare HMO

## 2013-08-05 DIAGNOSIS — Z5189 Encounter for other specified aftercare: Secondary | ICD-10-CM | POA: Diagnosis not present

## 2013-08-09 ENCOUNTER — Ambulatory Visit: Payer: Medicare HMO | Admitting: Physical Therapy

## 2013-08-09 ENCOUNTER — Ambulatory Visit: Payer: Medicare HMO

## 2013-08-09 ENCOUNTER — Ambulatory Visit: Payer: Medicare HMO | Admitting: Occupational Therapy

## 2013-08-09 DIAGNOSIS — Z5189 Encounter for other specified aftercare: Secondary | ICD-10-CM | POA: Diagnosis not present

## 2013-08-11 ENCOUNTER — Encounter: Payer: Medicare HMO | Admitting: Speech Pathology

## 2013-08-11 ENCOUNTER — Encounter: Payer: Medicare HMO | Admitting: Occupational Therapy

## 2013-08-11 ENCOUNTER — Ambulatory Visit: Payer: Medicare HMO | Admitting: Physical Therapy

## 2013-08-11 DIAGNOSIS — Z7409 Other reduced mobility: Secondary | ICD-10-CM | POA: Insufficient documentation

## 2013-08-11 DIAGNOSIS — R413 Other amnesia: Secondary | ICD-10-CM | POA: Insufficient documentation

## 2013-08-11 DIAGNOSIS — R278 Other lack of coordination: Secondary | ICD-10-CM | POA: Insufficient documentation

## 2013-08-12 ENCOUNTER — Encounter: Payer: Self-pay | Admitting: Surgery

## 2013-08-15 ENCOUNTER — Ambulatory Visit (INDEPENDENT_AMBULATORY_CARE_PROVIDER_SITE_OTHER)
Admission: RE | Admit: 2013-08-15 | Discharge: 2013-08-15 | Disposition: A | Discharge status: Home / Self Care | Payer: Medicare HMO | Source: Ambulatory Visit | Attending: Surgery | Admitting: Surgery

## 2013-08-15 ENCOUNTER — Ambulatory Visit (HOSPITAL_COMMUNITY)
Admission: RE | Admit: 2013-08-15 | Discharge: 2013-08-15 | Disposition: A | Discharge status: Home / Self Care | Payer: Medicare HMO | Source: Ambulatory Visit | Attending: Surgery | Admitting: Surgery

## 2013-08-15 ENCOUNTER — Other Ambulatory Visit (HOSPITAL_COMMUNITY): Payer: Medicare Other

## 2013-08-15 ENCOUNTER — Ambulatory Visit: Payer: Medicare Other | Admitting: Surgery

## 2013-08-15 ENCOUNTER — Encounter: Payer: Self-pay | Admitting: Surgery

## 2013-08-15 ENCOUNTER — Ambulatory Visit (INDEPENDENT_AMBULATORY_CARE_PROVIDER_SITE_OTHER): Payer: Medicare HMO | Admitting: Surgery

## 2013-08-15 VITALS — BP 137/70 | HR 77 | Ht 67.0 in | Wt 127.0 lb

## 2013-08-15 DIAGNOSIS — I714 Abdominal aortic aneurysm, without rupture, unspecified: Secondary | ICD-10-CM | POA: Insufficient documentation

## 2013-08-15 DIAGNOSIS — Z48812 Encounter for surgical aftercare following surgery on the circulatory system: Secondary | ICD-10-CM

## 2013-08-15 NOTE — Addendum Note (Signed)
Addended by: Sharee Pimple on: 08/15/2013 05:09 PM   Modules accepted: Orders

## 2013-08-15 NOTE — Progress Notes (Signed)
Patient name: Amy Bates MRN: [id number] DOB: [birth year] Sex: female     Chief Complaint  Patient presents with  . Re-evaluation    6 month f/u     HISTORY OF PRESENT ILLNESS: The patient is here today for followup. She presented to the hospital on 01/11/2013 with a symptomatic juxtarenal abdominal aortic aneurysm. Her CT scan showed a 5.4 cm aneurysm with inflammatory changes. I felt this was the etiology of her back pain. Because of the inflammatory component I felt a percutaneous approach was more appropriate. This did require snorkel technique for the left renal artery. The patient's abdominal pain has now resolved.  Postoperatively, she had issues with uncontrollable crying.  This was initially thought to be depression, she has now been diagnosed with PRES and is being treated at [facility].  She tells me that this should spontaneously resolve over 6-12 months.   Past Medical History  Diagnosis Date  . Arthritis   . Allergic rhinitis   . Asthma   . Depression   . GERD (gastroesophageal reflux disease)   . Hyperlipidemia   . Urinary incontinence   . History of chicken pox   . COPD (chronic obstructive pulmonary disease)   . Pneumonia   . Chest pain     [facility]  . Hypothyroid   . PRES (posterior reversible encephalopathy syndrome)   . Constipation   . Peripheral vascular disease   . Chronic kidney disease     Past Surgical History  Procedure Laterality Date  . Tubal ligation    . Tonsillectomy    . Knee surgery      right  . Endovascular stent insertion N/A 01/11/2013    Procedure: ENDOVASCULAR STENT GRAFT INSERTION; ULTRASOUND GUIDED, Renal Stent left renal artery , open left brachial artery closure.;  Surgeon: Nada Libman, MD;  Location: [facility] OR;  Service: Vascular;  Laterality: N/A;  . Abdominal aortic aneurysm repair    . Joint replacement      History   Social History  . Marital Status: Married    Spouse Name: N/A    Number of  Children: 3  . Years of Education: N/A   Occupational History  . Retired Leisure centre manager    Social History Main Topics  . Smoking status: Former Smoker    Start date: 01/11/2013  . Smokeless tobacco: Not on file  . Alcohol Use: No  . Drug Use: No  . Sexual Activity: Not on file   Other Topics Concern  . Not on file   Social History Narrative   Lives with mother-in-law.    Family History  Problem Relation Age of Onset  . Coronary artery disease Mother   . Stroke Mother   . Heart disease Mother     before age 59  . Hypertension Neg Hx   . Diabetes Neg Hx   . Cancer Neg Hx   . Multiple sclerosis Sister     Allergies as of 08/15/2013 - Review Complete 08/15/2013  Allergen Reaction Noted  . Ibuprofen Other (See Comments) 05/26/2013    Current Outpatient Prescriptions on File Prior to Visit  Medication Sig Dispense Refill  . albuterol (PROAIR HFA) 108 (90 BASE) MCG/ACT inhaler Inhale 2 puffs into the lungs every 6 (six) hours as needed.      Marland Kitchen atorvastatin (LIPITOR) 40 MG tablet Take 1 tablet (40 mg total) by mouth daily.  90 tablet  3  . carbidopa-levodopa (SINEMET IR) 25-250 MG per tablet Take 1  tablet by mouth 3 (three) times daily.      . fenofibrate 160 MG tablet Take 160 mg by mouth daily.      Marland Kitchen. levothyroxine (SYNTHROID, LEVOTHROID) 75 MCG tablet Take 75 mcg by mouth daily before breakfast.      . mometasone (ASMANEX) 220 MCG/INH inhaler Inhale 2 puffs into the lungs as needed.       . pantoprazole (PROTONIX) 40 MG tablet Take 1 tablet (40 mg total) by mouth daily.  30 tablet  3  . polyethylene glycol (MIRALAX / GLYCOLAX) packet Take 17 g by mouth 2 (two) times daily as needed (for constipation).      . QUEtiapine (SEROQUEL) 50 MG tablet Take 50 mg by mouth every evening.      . tiotropium (SPIRIVA) 18 MCG inhalation capsule Place 18 mcg into inhaler and inhale as needed.       . sertraline (ZOLOFT) 50 MG tablet Take 50 mg by mouth daily.      Marland Kitchen. thiamine 100 MG tablet  Take 100 mg by mouth daily.      . vitamin B-12 (CYANOCOBALAMIN) 1000 MCG tablet Take 1,000 mcg by mouth daily.       No current facility-administered medications on file prior to visit.     REVIEW OF SYSTEMS: Please see history of present illness, otherwise all systems negative  PHYSICAL EXAMINATION:   Vital signs are BP 137/70  Pulse 77  Ht 5\' 7"  (1.702 m)  Wt 127 lb (57.607 kg)  BMI 19.89 kg/m2  SpO2 100% General: The patient appears their stated age. HEENT:  No gross abnormalities Pulmonary:  Non labored breathing Abdomen: Soft and non-tender Musculoskeletal: There are no major deformities. Neurologic: No focal weakness or paresthesias are detected, Skin: There are no ulcer or rashes noted. Psychiatric: The patient has normal affect. Cardiovascular: There is a regular rate and rhythm without significant murmur.  Palpable femoral pulses appreciated.   Diagnostic Studies Ordered and reviewed her ultrasound studies.  abdominal ultrasound: Maximum aortic diameter is now 3.8.  Previously it was 5.4. Carotid duplex shows less than 40% stenosis bilaterally  Assessment: Status post emergent repair of juxtarenal inflammatory, symptomatic aneurysm Plan: The aneurysm is shrinking nicely on ultrasound.  She has a known occlusion of her left snorkel renal stent.  I will continue to follow her aneurysm with ultrasound.  Her next study will be in 6 months  V. Charlena CrossWells Amy Bates IV, M.D. [facility] Office: [phone] Pager:  [phone]

## 2013-08-16 ENCOUNTER — Ambulatory Visit: Payer: Medicare HMO

## 2013-08-16 ENCOUNTER — Ambulatory Visit: Payer: Medicare HMO | Attending: Family Medicine | Admitting: Occupational Therapy

## 2013-08-16 ENCOUNTER — Ambulatory Visit: Payer: Medicare HMO | Admitting: Physical Therapy

## 2013-08-16 DIAGNOSIS — Z5189 Encounter for other specified aftercare: Secondary | ICD-10-CM | POA: Insufficient documentation

## 2013-08-16 DIAGNOSIS — M6281 Muscle weakness (generalized): Secondary | ICD-10-CM | POA: Insufficient documentation

## 2013-08-16 DIAGNOSIS — G9349 Other encephalopathy: Secondary | ICD-10-CM | POA: Diagnosis not present

## 2013-08-16 DIAGNOSIS — G3184 Mild cognitive impairment, so stated: Secondary | ICD-10-CM | POA: Diagnosis not present

## 2013-08-16 DIAGNOSIS — R4701 Aphasia: Secondary | ICD-10-CM | POA: Insufficient documentation

## 2013-08-18 ENCOUNTER — Ambulatory Visit: Payer: Medicare HMO | Admitting: Physical Therapy

## 2013-08-18 ENCOUNTER — Ambulatory Visit: Payer: Medicare HMO | Admitting: Occupational Therapy

## 2013-08-18 ENCOUNTER — Ambulatory Visit: Payer: Medicare HMO

## 2013-08-18 DIAGNOSIS — Z5189 Encounter for other specified aftercare: Secondary | ICD-10-CM | POA: Diagnosis not present

## 2013-08-22 ENCOUNTER — Ambulatory Visit: Payer: Medicare HMO | Admitting: Occupational Therapy

## 2013-08-22 ENCOUNTER — Ambulatory Visit: Payer: Medicare HMO

## 2013-08-22 DIAGNOSIS — Z5189 Encounter for other specified aftercare: Secondary | ICD-10-CM | POA: Diagnosis not present

## 2013-08-25 ENCOUNTER — Ambulatory Visit: Payer: Medicare HMO

## 2013-08-25 ENCOUNTER — Telehealth: Payer: Self-pay | Admitting: Neurology

## 2013-08-25 ENCOUNTER — Telehealth: Payer: Self-pay | Admitting: General Practice

## 2013-08-25 ENCOUNTER — Telehealth: Payer: Self-pay | Admitting: *Deleted

## 2013-08-25 ENCOUNTER — Ambulatory Visit: Payer: Medicare HMO | Admitting: Occupational Therapy

## 2013-08-25 ENCOUNTER — Other Ambulatory Visit: Payer: Self-pay | Admitting: *Deleted

## 2013-08-25 DIAGNOSIS — Z5189 Encounter for other specified aftercare: Secondary | ICD-10-CM | POA: Diagnosis not present

## 2013-08-25 DIAGNOSIS — I6783 Posterior reversible encephalopathy syndrome: Secondary | ICD-10-CM

## 2013-08-25 DIAGNOSIS — R51 Headache: Secondary | ICD-10-CM

## 2013-08-25 DIAGNOSIS — R413 Other amnesia: Secondary | ICD-10-CM

## 2013-08-25 NOTE — Telephone Encounter (Signed)
I left a message for patient to call back for  appt information

## 2013-08-25 NOTE — Telephone Encounter (Signed)
Per fax received [place]'s office is recommending a referral to Dr. Faith Rogue a PMR physician for PRES. This referral was placed per [place].

## 2013-08-26 ENCOUNTER — Encounter: Payer: Self-pay | Admitting: Neurology

## 2013-08-26 ENCOUNTER — Ambulatory Visit (INDEPENDENT_AMBULATORY_CARE_PROVIDER_SITE_OTHER): Payer: Commercial Managed Care - HMO | Admitting: Family Medicine

## 2013-08-26 ENCOUNTER — Ambulatory Visit (INDEPENDENT_AMBULATORY_CARE_PROVIDER_SITE_OTHER): Payer: Medicare HMO | Admitting: Neurology

## 2013-08-26 ENCOUNTER — Encounter: Payer: Self-pay | Admitting: Family Medicine

## 2013-08-26 VITALS — BP 132/80 | HR 74 | Temp 98.0°F | Resp 16 | Ht 67.0 in | Wt 129.0 lb

## 2013-08-26 VITALS — BP 130/78 | HR 85 | Temp 98.0°F | Resp 16 | Wt 129.0 lb

## 2013-08-26 DIAGNOSIS — I6783 Posterior reversible encephalopathy syndrome: Secondary | ICD-10-CM

## 2013-08-26 DIAGNOSIS — G9340 Encephalopathy, unspecified: Secondary | ICD-10-CM

## 2013-08-26 DIAGNOSIS — G9349 Other encephalopathy: Secondary | ICD-10-CM

## 2013-08-26 DIAGNOSIS — E039 Hypothyroidism, unspecified: Secondary | ICD-10-CM

## 2013-08-26 LAB — T3, FREE: T3, Free: 2.1 pg/mL — ABNORMAL LOW (ref 2.3–4.2)

## 2013-08-26 LAB — T4, FREE: Free T4: 1.25 ng/dL (ref 0.60–1.60)

## 2013-08-26 LAB — TSH: TSH: 1.54 u[IU]/mL (ref 0.35–4.50)

## 2013-08-26 LAB — B12 AND FOLATE PANEL
FOLATE: 7.4 ng/mL (ref >5.9)
Vitamin B-12: 447 pg/mL (ref 211–911)

## 2013-08-26 NOTE — Progress Notes (Signed)
Pre visit review using our clinic review tool, if applicable. No additional management support is needed unless otherwise documented below in the visit note. 

## 2013-08-26 NOTE — Patient Instructions (Signed)
1.  [facility] at [facility]13 2.  Follow up with Dr. Hermelinda MedicusSchwartz 3.  We will refer you to Dr. Leonides CaveZelson, the neuropsychologist 4.  Follow up in 3 months.

## 2013-08-26 NOTE — Progress Notes (Signed)
NEUROLOGY FOLLOW UP OFFICE NOTE  Amy Bates [id number]  HISTORY OF PRESENT ILLNESS: Amy Bates is a 68 year old right-handed woman with history of COPD, hypothyroidism, hyperlipidemia, depression, renal artery occlusion, metal in jaw, and abdominal aortic aneurysm who follows up for PRES.  She is accompanied by her husband.  Records and images were personally reviewed where available.  UPDATE: Advised to follow up with neuroimmunology at [facility].  She has an upcoming appointment with Dr. [name] on 08/30/13.  She followed up with Dr. [name] of PM&R at [facility] on 08/10/13.  She has shown significant improvement in cognitive deficits, however memory is still an issue.  Gait has improved and was advised to continue Sinemet.  Seroquel was discontinued.  For headaches and insomnia, she was started on amitriptyline (due to history of renal problems, topiramate was not initiated).  She was started on melatonin 3-$RemoveBefore'6mg'[id number]$  at bedtime for insomnia.  She is feeling much better.  She appears to be in good spirits.  HISTORY: She was found to have a 5.4 x 4.8cm unruptured infrarenal abdominal aortic aneurysm after experiencing three weeks of abdominal pain.  She underwent endovascular repair and stenting on 01/11/13.    Since that time, she and her husband have noted periods of confusion.  She initially began having word-finding difficulties where she would lose track of thought and would fumble for the words.  She understands others and is able to read without difficulty.  This was intermittent but becoming more frequent.  Whenever she has word-finding difficulties, her husband will start asking her questions to test her.  She presented to the ED on 03/15/13 because she was not able to tell her husband the year or her daughters' names.  CT Head revealed chronic small vessel ischemic changes, but no evidence of acute infarct, mass lesion or hemorrhage.  She was diagnosed with an upper respiratory  infection.  She also began experiencing bi-frontal and retro-orbital headaches, described as pounding, about 6/10 (not worse headache of her life) and not associated with nausea, vomiting, photophobia, phonophobia, or visual disturbance.  She denies neck or shoulder pain.  It would occur about 3 days a week and would last a couple of hours.  Onset seems to occur later in the day.  It is not positional.  She treats it with acetaminophen, which helps.  She denies vision problems.  She continued to have a deterioration of depressive symptoms.  She was placed on Abilify and her psychiatrist later started Wellbutrin.  She was admitted to [facility] on 05/27/13 for altered mental status.  She had a fall.  There was mentioning of possible seizure activity, described as convulsive movements with frothing at the mouth and cyanosis of the lips.  Labs on admission revealed Na 136, K 3.5, Cl 95, bicarb 24, glucose 122, BUN 13, Cr 1.10, Ca 9.6, TP 7.4, Alb 4.1, AST 24, ALT 11, AP 53, TB 0.5, WBC 11.5, Hgb 13.8, Hct 39.2, PLT 251, Leukocytosis was thought to be secondary to fall.  Otherwise, labs did not reveal sign of infection.  She was not hypertensive.  Initial CT of the head showed possible thin isodense subdural hematoma, but was thought to be artifact, as repeat imaging did not reveal this.  However, the repeat CT of the head revealed confluent generalized white matter hypodensities in the cerebrum, with reduced conspicuity of the sulci when compared to prior CT on 03/15/13, thought was mentioned to be associated with PRES.  EEG revealed generalized background slowing  but no seizure or epileptiform activity.  Wellbutrin was discontinued.    She was subsequently hospitalized at [facility] on 06/08/13 for worsening mental status and aphasia.  With her history of metal in her jaw, they used a low-magnet MRI of the brain with and without contrast, which revealed diffuse cerebral edema with central mass effect on the lateral  ventricles, sequela of PRES versus encephalitis.  There was also evidence of acute lacunar infarct within the posterior left periventricular white matter.  She underwent long-term EEG monitoring which revealed generalized slowing but no seizures or epileptiform activity.  She underwent workup for infectious, paraneoplastic, autoimmune and demyelinating etiologies.  She underwent LP, which revealed cell count 1, CMV PCR negative, EBV-PCR negative, VZV PCR negative, HSV PCR negative, gram stain and culture negative, cryptococal antigen negative, lactate 1.3, protein 87, glucose 63.  JCV antibody negative.  OCBs were noted in CSF and serum.  Other labs included NMDA receptor antibody negative, paraneoplastic panel negative.  B1 was 81.  ESR 20.  CRP 4.1.  Mg 1.8.  Phosphorus 3.5.  UA was positive.  CT of chest/abdomen/pelvis did not reveal cancer.  Blood pressure was managed, but she did not improve.  It was suspected that she may have an autoimmune encephalitis, so she was treated with Solumedrol for 3 days.  PLEX was considered but held because she improved on the steroids.  It was determined that she likely had either anoxic brain injury during her AAA surgery or an acquired leukodystrophy.  It was recommended that she follow-up with Neuroimmunology at [facility] for further workup regarding either a demyelinating disease or a leukodystrophy.  Ultimately, she exhibited cognitive deficits, as demonstrated by poor memory, insight, attention and concentration.  She was discharged to Rehab at [facility], where she showed much improvement.  Abilify was stopped.  She exhibited a short steppage gait with flexed posture, so Sinemet was initiated.  She was placed on Seroquel and Zoloft.  She has a history of depression, but she has had significant increase in depression since the surgery, in which she easily cries.   Family history is significant for father with Alzheimer's disease and aunt with cerebral aneurysm.  PAST  MEDICAL HISTORY: Past Medical History  Diagnosis Date  . Arthritis   . Allergic rhinitis   . Asthma   . Depression   . GERD (gastroesophageal reflux disease)   . Hyperlipidemia   . Urinary incontinence   . History of chicken pox   . COPD (chronic obstructive pulmonary disease)   . Pneumonia   . Chest pain     [facility]  . Hypothyroid   . PRES (posterior reversible encephalopathy syndrome)   . Constipation   . Peripheral vascular disease   . Chronic kidney disease     MEDICATIONS: Current Outpatient Prescriptions on File Prior to Visit  Medication Sig Dispense Refill  . QUEtiapine (SEROQUEL) 50 MG tablet Take 50 mg by mouth every evening.      Marland Kitchen albuterol (PROAIR HFA) 108 (90 BASE) MCG/ACT inhaler Inhale 2 puffs into the lungs every 6 (six) hours as needed.      Marland Kitchen amitriptyline (ELAVIL) 50 MG tablet Take 50 mg by mouth daily.      Marland Kitchen atorvastatin (LIPITOR) 40 MG tablet Take 1 tablet (40 mg total) by mouth daily.  90 tablet  3  . carbidopa-levodopa (SINEMET IR) 25-250 MG per tablet Take 1 tablet by mouth 3 (three) times daily.      . fenofibrate 160 MG tablet  Take 160 mg by mouth daily.      Marland Kitchen levothyroxine (SYNTHROID, LEVOTHROID) 75 MCG tablet Take 75 mcg by mouth daily before breakfast.      . Melatonin 3 MG TABS Take 1 tablet by mouth daily.      . mometasone (ASMANEX) 220 MCG/INH inhaler Inhale 2 puffs into the lungs as needed.       . pantoprazole (PROTONIX) 40 MG tablet Take 1 tablet (40 mg total) by mouth daily.  30 tablet  3  . polyethylene glycol (MIRALAX / GLYCOLAX) packet Take 17 g by mouth 2 (two) times daily as needed (for constipation).      Marland Kitchen tiotropium (SPIRIVA) 18 MCG inhalation capsule Place 18 mcg into inhaler and inhale as needed.        No current facility-administered medications on file prior to visit.    ALLERGIES: Allergies  Allergen Reactions  . Ibuprofen Other (See Comments)    Patient has a history of ulcers    FAMILY HISTORY: Family  History  Problem Relation Age of Onset  . Coronary artery disease Mother   . Stroke Mother   . Heart disease Mother     before age 68  . Hypertension Neg Hx   . Diabetes Neg Hx   . Cancer Neg Hx   . Multiple sclerosis Sister     SOCIAL HISTORY: History   Social History  . Marital Status: Married    Spouse Name: N/A    Number of Children: 3  . Years of Education: N/A   Occupational History  . Retired Chief Operating Officer    Social History Main Topics  . Smoking status: Former Smoker    Start date: 01/11/2013  . Smokeless tobacco: Not on file  . Alcohol Use: No  . Drug Use: No  . Sexual Activity: Not on file   Other Topics Concern  . Not on file   Social History Narrative   Lives with mother-in-law.    REVIEW OF SYSTEMS: Constitutional: No fevers, chills, or sweats, no generalized fatigue, change in appetite Eyes: No visual changes, double vision, eye pain Ear, nose and throat: No hearing loss, ear pain, nasal congestion, sore throat Cardiovascular: No chest pain, palpitations Respiratory:  No shortness of breath at rest or with exertion, wheezes GastrointestinaI: No nausea, vomiting, diarrhea, abdominal pain, fecal incontinence Genitourinary:  No dysuria, urinary retention or frequency Musculoskeletal:  No neck pain, back pain Integumentary: No rash, pruritus, skin lesions Neurological: as above Psychiatric: No depression, insomnia, anxiety Endocrine: No palpitations, fatigue, diaphoresis, mood swings, change in appetite, change in weight, increased thirst Hematologic/Lymphatic:  No anemia, purpura, petechiae. Allergic/Immunologic: no itchy/runny eyes, nasal congestion, recent allergic reactions, rashes  PHYSICAL EXAM: Filed Vitals:   08/26/13 1300  BP: 132/80  Pulse: 74  Temp: 98 F (36.7 C)  Resp: 16   General: No acute distress Head:  Normocephalic/atraumatic Neck: supple, no paraspinal tenderness, full range of motion Heart:  Regular rate and rhythm Lungs:   Clear to auscultation bilaterally Back: No paraspinal tenderness Neurological Exam: alert and oriented to person, place, and time. Attention span and concentration fairly intact, recent (recalled 4 of 5 words) and remote memory intact, fund of knowledge intact.  Calculated 2 of 5 serial 7 subtractions correctly. Speech fluent and not dysarthric.  When naming, made paraphasic error with the rhino (called it hippo).  Made one mistake with the Trail Making Test.  Could not copy a cube correctly.  Clock drawing correct.  MOCA 20/30.  CN  II-XII intact. Fundoscopic exam unremarkable without vessel changes, exudates, hemorrhages or papilledema.  Bulk and tone normal, muscle strength 5/5 throughout.  Finger-thumb tapping with good speed but reduced amplitude.  Sensation to light touch, temperature and vibration intact.  Deep tendon reflexes 2+ throughout, toes downgoing.  Finger to nose and heel to shin testing intact.  Gait with flexed posture but normal stride.  Able to turn and walk in tandem, Romberg negative.  IMPRESSION: Encephalopathy.  Differential includes perioperative anoxic brain injury during AAA repair versus adult onset or acquired leukodystrophy versus PRES  PLAN: 1.  Follow up with Dr. [name] on 08/30/13 at [facility]. 2.  Follow up with Dr. Tessa Lerner for PM&R 3.  Refer to Dr. Valentina Shaggy for neuropsych testing 4.  Follow up in 3 months.  30 minutes spent with patient and husband, over 50% spent counseling and coordinating care.  Metta Clines, DO  CC:  Annye Asa, MD

## 2013-08-26 NOTE — Patient Instructions (Signed)
Schedule your complete physical in 3 months We'll notify you of your lab results and make any changes if needed [facility] stop SEROQUEL, not the SERTRALINE Call with any questions or concerns You look great!  Keep it up!

## 2013-08-26 NOTE — Progress Notes (Signed)
   Subjective:    Patient ID: Amy Bates, female    DOB: 11/11/1945, 68 y.o.   MRN: [id number]  HPI Neuro F/U- [facility].  They recommended she stop Seroquel but husband got confused and stopped Sertraline.  Pt would prefer to see local PM&R (Dr Riley KillSwartz).  [facility] wants me to check pt's thyroid levels and vitamin levels.  Pt is smiling and doing 'much better'.  No CP, SOB, HAs, visual changes.  Much less tearful.   Review of Systems For ROS see HPI     Objective:   Physical Exam  Vitals reviewed. Constitutional: She is oriented to person, place, and time. She appears well-developed and well-nourished. No distress.  HENT:  Head: Normocephalic and atraumatic.  Eyes: Conjunctivae and EOM are normal. Pupils are equal, round, and reactive to light.  Neck: Normal range of motion. Neck supple. No thyromegaly present.  Cardiovascular: Normal rate, regular rhythm, normal heart sounds and intact distal pulses.   No murmur heard. Pulmonary/Chest: Effort normal and breath sounds normal. No respiratory distress.  Abdominal: Soft. She exhibits no distension. There is no tenderness.  Musculoskeletal: She exhibits no edema.  Lymphadenopathy:    She has no cervical adenopathy.  Neurological: She is alert and oriented to person, place, and time.  Skin: Skin is warm and dry.  Psychiatric: She has a normal mood and affect. Her behavior is normal.          Assessment & Plan:

## 2013-08-28 NOTE — Assessment & Plan Note (Signed)
[facility] referral to Dr Hermelinda MedicusSchwartz already entered.  Pt seems to be much better than previous.  Has appt later today w/ Neuro.  Will continue to follow.

## 2013-08-28 NOTE — Assessment & Plan Note (Signed)
Chronic problem.  Check labs to determine if medication needs to be adjusted.  Will follow.

## 2013-08-29 ENCOUNTER — Ambulatory Visit: Payer: Medicare HMO | Admitting: Occupational Therapy

## 2013-08-29 ENCOUNTER — Ambulatory Visit: Payer: Medicare HMO

## 2013-08-29 DIAGNOSIS — Z5189 Encounter for other specified aftercare: Secondary | ICD-10-CM | POA: Diagnosis not present

## 2013-08-29 LAB — VITAMIN B1: Vitamin B1 (Thiamine): 32 nmol/L — ABNORMAL HIGH (ref 8–30)

## 2013-08-30 ENCOUNTER — Encounter: Payer: Self-pay | Admitting: General Practice

## 2013-08-30 ENCOUNTER — Encounter: Payer: Medicare HMO | Admitting: Occupational Therapy

## 2013-08-30 ENCOUNTER — Ambulatory Visit: Payer: Medicare HMO

## 2013-08-30 DIAGNOSIS — Z5189 Encounter for other specified aftercare: Secondary | ICD-10-CM | POA: Diagnosis not present

## 2013-09-01 ENCOUNTER — Encounter: Payer: Self-pay | Admitting: Physical Medicine & Rehabilitation

## 2013-09-02 ENCOUNTER — Telehealth: Payer: Self-pay | Admitting: Family Medicine

## 2013-09-02 DIAGNOSIS — R569 Unspecified convulsions: Secondary | ICD-10-CM

## 2013-09-02 DIAGNOSIS — Z8249 Family history of ischemic heart disease and other diseases of the circulatory system: Secondary | ICD-10-CM

## 2013-09-02 DIAGNOSIS — I6783 Posterior reversible encephalopathy syndrome: Secondary | ICD-10-CM

## 2013-09-02 DIAGNOSIS — R413 Other amnesia: Secondary | ICD-10-CM

## 2013-09-02 NOTE — Telephone Encounter (Signed)
Ok to refer? Please advise.  

## 2013-09-02 NOTE — Telephone Encounter (Signed)
Caller name: Donielle  Call back number:[phone]   Reason for call: pt [facility] - Dr. Virgina OrganQureshi / Dr. Wylene MenLacey.   Pt is needing to see this MD because she has [employer] [employer] and they are wanting her to have a referral put in so she can go back and get her medications addressed so she can then change over to Dr. Everlena CooperJaffe

## 2013-09-05 NOTE — Telephone Encounter (Signed)
Ok to refer.

## 2013-09-05 NOTE — Telephone Encounter (Signed)
Referral placed.

## 2013-09-06 ENCOUNTER — Ambulatory Visit: Payer: Medicare HMO | Admitting: Speech Pathology

## 2013-09-06 DIAGNOSIS — Z5189 Encounter for other specified aftercare: Secondary | ICD-10-CM | POA: Diagnosis not present

## 2013-09-08 ENCOUNTER — Telehealth: Payer: Self-pay | Admitting: Family Medicine

## 2013-09-08 DIAGNOSIS — I6783 Posterior reversible encephalopathy syndrome: Secondary | ICD-10-CM

## 2013-09-08 NOTE — Telephone Encounter (Signed)
Requesting referral to Neurologist Dr [facility]acey [facility], does not need appt just referral, will need DX code

## 2013-09-08 NOTE — Telephone Encounter (Signed)
Please enter referral to Neuro (dx PRES)

## 2013-09-08 NOTE — Telephone Encounter (Signed)
Referral was placed again, Had already placed one on 6/22.

## 2013-09-12 ENCOUNTER — Encounter: Payer: Self-pay | Admitting: General Practice

## 2013-09-12 ENCOUNTER — Ambulatory Visit (INDEPENDENT_AMBULATORY_CARE_PROVIDER_SITE_OTHER): Payer: Commercial Managed Care - HMO | Admitting: Family Medicine

## 2013-09-12 ENCOUNTER — Encounter: Payer: Self-pay | Admitting: Family Medicine

## 2013-09-12 VITALS — BP 128/82 | HR 76 | Temp 98.4°F | Resp 16 | Wt 126.2 lb

## 2013-09-12 DIAGNOSIS — K5901 Slow transit constipation: Secondary | ICD-10-CM

## 2013-09-12 DIAGNOSIS — N2889 Other specified disorders of kidney and ureter: Secondary | ICD-10-CM

## 2013-09-12 DIAGNOSIS — N280 Ischemia and infarction of kidney: Secondary | ICD-10-CM

## 2013-09-12 DIAGNOSIS — E46 Unspecified protein-calorie malnutrition: Secondary | ICD-10-CM | POA: Insufficient documentation

## 2013-09-12 LAB — CBC WITH DIFFERENTIAL/PLATELET
Basophils Absolute: 0 10*3/uL (ref 0.0–0.1)
Basophils Relative: 0.7 % (ref 0.0–3.0)
EOS ABS: 0.1 10*3/uL (ref 0.0–0.7)
Eosinophils Relative: 1.2 % (ref 0.0–5.0)
HCT: 39.7 % (ref 36.0–46.0)
HEMOGLOBIN: 13.2 g/dL (ref 12.0–15.0)
LYMPHS PCT: 31.8 % (ref 12.0–46.0)
Lymphs Abs: 2 10*3/uL (ref 0.7–4.0)
MCHC: 33.3 g/dL (ref 30.0–36.0)
MCV: 90.7 fl (ref 78.0–100.0)
MONO ABS: 0.5 10*3/uL (ref 0.1–1.0)
Monocytes Relative: 8.8 % (ref 3.0–12.0)
NEUTROS ABS: 3.6 10*3/uL (ref 1.4–7.7)
Neutrophils Relative %: 57.5 % (ref 43.0–77.0)
Platelets: 290 10*3/uL (ref 150.0–400.0)
RBC: 4.37 Mil/uL (ref 3.87–5.11)
RDW: 13.1 % (ref 11.5–15.5)
WBC: 6.3 10*3/uL (ref 4.0–10.5)

## 2013-09-12 LAB — BASIC METABOLIC PANEL
BUN: 21 mg/dL (ref 6–23)
CO2: 27 meq/L (ref 19–32)
CREATININE: 1.3 mg/dL — AB (ref 0.4–1.2)
Calcium: 9.9 mg/dL (ref 8.4–10.5)
Chloride: 102 mEq/L (ref 96–112)
GFR: 42.65 mL/min — ABNORMAL LOW (ref >60.00)
GLUCOSE: 78 mg/dL (ref 70–99)
Potassium: 4.1 mEq/L (ref 3.5–5.1)
SODIUM: 138 meq/L (ref 135–145)

## 2013-09-12 LAB — HEPATIC FUNCTION PANEL
ALT: 12 U/L (ref 0–35)
AST: 34 U/L (ref 0–37)
Albumin: 4.5 g/dL (ref 3.5–5.2)
Alkaline Phosphatase: 41 U/L (ref 39–117)
Bilirubin, Direct: 0.1 mg/dL (ref 0.0–0.3)
Total Bilirubin: 0.5 mg/dL (ref 0.2–1.2)
Total Protein: 7.6 g/dL (ref 6.0–8.3)

## 2013-09-12 LAB — MAGNESIUM: Magnesium: 1.9 mg/dL (ref 1.5–2.5)

## 2013-09-12 NOTE — Assessment & Plan Note (Signed)
Ongoing issue.  Check labs to assess Cr (and r/o renal insufficiency as cause of anorexia).  Will follow.

## 2013-09-12 NOTE — Progress Notes (Signed)
   Subjective:    Patient ID: Amy Bates, female    DOB: 01/18/1946, 68 y.o.   MRN: [id number]  HPI Constipation- pt reports sxs for 'over a week'.  Some nausea.  Pt is back on Sertraline, has stopped the Seroquel.  Taking Miralax once daily- was on list to take twice daily.  Drinking fluids.  Not eating regularly- husband reports 'no appetite'.  Pt has protein shakes available but he states she's not drinking them.  Husband is worried b/c 'this is how we found the aneurysm'.  Now w/ known renal artery occlusion.  Husband is concerned about renal failure as cause of anorexia.   Review of Systems For ROS see HPI     Objective:   Physical Exam  Vitals reviewed. Constitutional: She appears well-developed. No distress.  Thin, frail appearing  Cardiovascular: Normal rate, regular rhythm and intact distal pulses.   Pulmonary/Chest: Effort normal and breath sounds normal. No respiratory distress. She has no wheezes. She has no rales.  Abdominal: Soft. Bowel sounds are normal. She exhibits distension (mild lower abdominal distention). There is tenderness (mild lower abdominal TTP, no rebound or guarding). There is no rebound and no guarding.  Musculoskeletal: She exhibits no edema.  Skin: Skin is warm and dry.          Assessment & Plan:

## 2013-09-12 NOTE — Patient Instructions (Signed)
Follow up as needed Increase the Miralax to twice daily Add OTC Dulcolax If no BM by Wednesday morning, do a Fleets Enema at home.  If no results, please call so we can determine next steps Drink plenty of fluids Make sure you are eating regularly throughout the day- PROTEIN! Call with any questions or concerns Hang in there!

## 2013-09-12 NOTE — Assessment & Plan Note (Signed)
New.  Encouraged pt to increase Miralax to twice daily, increase fiber intake (food intake in general).  Add OTC dulcolax or fleets prn.  Reviewed supportive care and red flags that should prompt return.  Pt expressed understanding and is in agreement w/ plan.

## 2013-09-12 NOTE — Progress Notes (Signed)
Pre visit review using our clinic review tool, if applicable. No additional management support is needed unless otherwise documented below in the visit note. 

## 2013-09-12 NOTE — Assessment & Plan Note (Signed)
New.  Encouraged pt to eat regularly and while it doesn't have to be large meals, it does have to contain protein and be consistent.  Check labs to assess nutrition status.  Will follow.

## 2013-09-13 ENCOUNTER — Ambulatory Visit: Payer: Medicare HMO

## 2013-09-13 DIAGNOSIS — Z5189 Encounter for other specified aftercare: Secondary | ICD-10-CM | POA: Diagnosis not present

## 2013-09-14 ENCOUNTER — Telehealth: Payer: Self-pay | Admitting: *Deleted

## 2013-09-14 NOTE — Telephone Encounter (Signed)
Received Initial Summary and Plan for Treatment paperwork via fax from [facility].  Billing sheet attached and placed in folder for Dr. Beverely Lowabori to complete and sign.//AB/CMA

## 2013-09-15 ENCOUNTER — Ambulatory Visit: Payer: Commercial Managed Care - HMO

## 2013-09-15 ENCOUNTER — Telehealth: Payer: Self-pay | Admitting: Family Medicine

## 2013-09-15 NOTE — Telephone Encounter (Signed)
If I read that correctly-- she is doing better -- so just keep her reg scheduled ov

## 2013-09-15 NOTE — Telephone Encounter (Signed)
Pt husband called back.  Her BP an hour ago was 145/86, heart rate was 98.  Now her BP is 125/76, heart rate is 97.  Pt was not constipated an hour ago.  When pt came in for appt 09/12/13 for constipation, Tabori advised to increase Miralax and add dulcolax.  At this point, she is using the bathroom constantly.  Pt states she is feeling better than she did an hour ago.    Pt husband wanted to keep us informed.  bw

## 2013-09-15 NOTE — Telephone Encounter (Addendum)
Caller name: Caryn BeeKevin Relation to ZO:XWRUEAVpt:husband Call back number:[phone]   Reason for call:  Husband called to let us know wife's BP was 145/86.  States pt is constipated and will recheck BP after lunch and call back with update of BP.    Just and FYI

## 2013-09-15 NOTE — Telephone Encounter (Signed)
Spoke with patient who states that she is doing better. Advised to contact if needed.

## 2013-09-15 NOTE — Telephone Encounter (Signed)
Is there any intervention that you see necessary?

## 2013-09-21 ENCOUNTER — Ambulatory Visit: Payer: Commercial Managed Care - HMO | Attending: Family Medicine

## 2013-09-21 DIAGNOSIS — R4701 Aphasia: Secondary | ICD-10-CM | POA: Diagnosis not present

## 2013-09-21 DIAGNOSIS — G9349 Other encephalopathy: Secondary | ICD-10-CM | POA: Insufficient documentation

## 2013-09-21 DIAGNOSIS — IMO0001 Reserved for inherently not codable concepts without codable children: Secondary | ICD-10-CM | POA: Insufficient documentation

## 2013-09-23 ENCOUNTER — Ambulatory Visit: Payer: Commercial Managed Care - HMO

## 2013-09-23 DIAGNOSIS — IMO0001 Reserved for inherently not codable concepts without codable children: Secondary | ICD-10-CM | POA: Diagnosis not present

## 2013-09-26 ENCOUNTER — Other Ambulatory Visit: Payer: Self-pay | Admitting: Family Medicine

## 2013-09-26 NOTE — Telephone Encounter (Signed)
Med filled.  

## 2013-09-27 ENCOUNTER — Ambulatory Visit: Payer: Commercial Managed Care - HMO

## 2013-09-27 DIAGNOSIS — IMO0001 Reserved for inherently not codable concepts without codable children: Secondary | ICD-10-CM | POA: Diagnosis not present

## 2013-09-29 ENCOUNTER — Ambulatory Visit: Payer: Commercial Managed Care - HMO

## 2013-09-29 DIAGNOSIS — IMO0001 Reserved for inherently not codable concepts without codable children: Secondary | ICD-10-CM | POA: Diagnosis not present

## 2013-09-29 NOTE — Telephone Encounter (Signed)
Completed.//AB/CMA 

## 2013-10-04 ENCOUNTER — Other Ambulatory Visit: Payer: Self-pay

## 2013-10-04 ENCOUNTER — Encounter: Payer: Self-pay | Admitting: Neurology

## 2013-10-04 ENCOUNTER — Telehealth: Payer: Self-pay | Admitting: Family Medicine

## 2013-10-04 ENCOUNTER — Other Ambulatory Visit: Payer: Self-pay | Admitting: Family Medicine

## 2013-10-04 ENCOUNTER — Ambulatory Visit (INDEPENDENT_AMBULATORY_CARE_PROVIDER_SITE_OTHER): Payer: Commercial Managed Care - HMO | Admitting: Neurology

## 2013-10-04 VITALS — BP 112/72 | HR 84 | Resp 16 | Ht 67.0 in | Wt 128.5 lb

## 2013-10-04 DIAGNOSIS — G9349 Other encephalopathy: Secondary | ICD-10-CM

## 2013-10-04 DIAGNOSIS — I6783 Posterior reversible encephalopathy syndrome: Secondary | ICD-10-CM

## 2013-10-04 NOTE — Progress Notes (Signed)
NEUROLOGY FOLLOW UP OFFICE NOTE  Amy Bates [id number]  HISTORY OF PRESENT ILLNESS: Amy Bates is a 68 year old right-handed woman with history of COPD, hypothyroidism, hyperlipidemia, depression, renal artery occlusion, metal in jaw, and abdominal aortic aneurysm who follows up for PRES.  She is accompanied by her husband.  Records and images were personally reviewed where available.  UPDATE: On 08/30/13, she followed up Dr. [name] of neuroimmunology at [facility], who recommended repeat MRI of the brain.  It was recommended to start slowly tapering off Sinemet, which she has stopped.  She has also discontinued seroquel.  She is still taking amitriptyline 50mg .  She has gone to neuro-rehab and doing well.  She is in good spirits.  She is sleeping well.  No headaches.  She is walking better.  She began balancing the checkbook again.  She still does not drive.  She occasionally has word-finding difficulties.  Mood is good.  HISTORY: She was found to have a 5.4 x 4.8cm unruptured infrarenal abdominal aortic aneurysm after experiencing three weeks of abdominal pain.  She underwent endovascular repair and stenting on 01/11/13.   Following the procedure, she began feeling confused.  She initially began having word-finding difficulties where she would lose track of thought and would fumble for the words.  She understands others and is able to read without difficulty.  This was intermittent but becoming more frequent.  Whenever she has word-finding difficulties, her husband will start asking her questions to test her.  She presented to the ED on 03/15/13 because she was not able to tell her husband the year or her daughters' names.  CT Head revealed chronic small vessel ischemic changes, but no evidence of acute infarct, mass lesion or hemorrhage.  She was diagnosed with an upper respiratory infection.  She also began experiencing bi-frontal and retro-orbital headaches, described as pounding, about  6/10 (not worse headache of her life) and not associated with nausea, vomiting, photophobia, phonophobia, or visual disturbance.  She denies neck or shoulder pain.  It would occur about 3 days a week and would last a couple of hours.  Onset seems to occur later in the day.  It is not positional.  She treats it with acetaminophen, which helps.  She denies vision problems.  She continued to have a deterioration of depressive symptoms.  She was placed on Abilify and her psychiatrist later started Wellbutrin.  She was admitted to [facility] on 05/27/13 for altered mental status.  She had a fall.  There was mentioning of possible seizure activity, described as convulsive movements with frothing at the mouth and cyanosis of the lips.  Labs on admission revealed Na 136, K 3.5, Cl 95, bicarb 24, glucose 122, BUN 13, Cr 1.10, Ca 9.6, TP 7.4, Alb 4.1, AST 24, ALT 11, AP 53, TB 0.5, WBC 11.5, Hgb 13.8, Hct 39.2, PLT 251, Leukocytosis was thought to be secondary to fall.  Otherwise, labs did not reveal sign of infection.  She was not hypertensive.  Initial CT of the head showed possible thin isodense subdural hematoma, but was thought to be artifact, as repeat imaging did not reveal this.  However, the repeat CT of the head revealed confluent generalized white matter hypodensities in the cerebrum, with reduced conspicuity of the sulci when compared to prior CT on 03/15/13, thought was mentioned to be associated with PRES.  EEG revealed generalized background slowing but no seizure or epileptiform activity.  Wellbutrin was discontinued.    She was subsequently  hospitalized at [facility] on 06/08/13 for worsening mental status and aphasia.  With her history of metal in her jaw, they used a low-magnet MRI of the brain with and without contrast, which revealed diffuse cerebral edema with central mass effect on the lateral ventricles, sequela of PRES versus encephalitis.  There was also evidence of acute lacunar infarct within the  posterior left periventricular white matter.  She underwent long-term EEG monitoring which revealed generalized slowing but no seizures or epileptiform activity.  She underwent workup for infectious, paraneoplastic, autoimmune and demyelinating etiologies.  She underwent LP, which revealed cell count 1, CMV PCR negative, EBV-PCR negative, VZV PCR negative, HSV PCR negative, gram stain and culture negative, cryptococal antigen negative, lactate 1.3, protein 87, glucose 63.  JCV antibody negative.  OCBs were noted in CSF and serum.  Other labs included NMDA receptor antibody negative, paraneoplastic panel negative.  B1 was 81.  ESR 20.  CRP 4.1.  Mg 1.8.  Phosphorus 3.5.  UA was positive.  CT of chest/abdomen/pelvis did not reveal cancer.  Blood pressure was managed, but she did not improve.  It was suspected that she may have an autoimmune encephalitis, so she was treated with Solumedrol for 3 days.  PLEX was considered but held because she improved on the steroids.    Ultimately, she exhibited cognitive deficits, as demonstrated by poor memory, insight, attention and concentration.  She was discharged to Rehab at [facility], where she showed much improvement.  Abilify was stopped.  She exhibited a short steppage gait with flexed posture, so Sinemet was initiated.  She was placed on Seroquel and Zoloft.  She developed marked improvement in cognitive deficits, however memory was still an issue.  Gait improved.  Dr. [name] of PM&R at [facility] started her on amitriptyline for headache and insomnia, and melatonin 3-$RemoveBeforeD'6mg'nYFAaYCPCRYMZr$  at bedtime for insomnia.  She has a history of depression, but she has had significant increase in depression since the surgery, in which she easily cries.   Family history is significant for father with Alzheimer's disease and aunt with cerebral aneurysm.  PAST MEDICAL HISTORY: Past Medical History  Diagnosis Date  . Arthritis   . Allergic rhinitis   . Asthma   . Depression   . GERD  (gastroesophageal reflux disease)   . Hyperlipidemia   . Urinary incontinence   . History of chicken pox   . COPD (chronic obstructive pulmonary disease)   . Pneumonia   . Chest pain     [facility]  . Hypothyroid   . PRES (posterior reversible encephalopathy syndrome)   . Constipation   . Peripheral vascular disease   . Chronic kidney disease     MEDICATIONS: Current Outpatient Prescriptions on File Prior to Visit  Medication Sig Dispense Refill  . albuterol (PROAIR HFA) 108 (90 BASE) MCG/ACT inhaler Inhale 2 puffs into the lungs every 6 (six) hours as needed.      Marland Kitchen amitriptyline (ELAVIL) 50 MG tablet Take 50 mg by mouth daily.      Marland Kitchen atorvastatin (LIPITOR) 40 MG tablet Take 1 tablet (40 mg total) by mouth daily.  90 tablet  3  . fenofibrate 160 MG tablet Take 160 mg by mouth daily.      Marland Kitchen levothyroxine (SYNTHROID, LEVOTHROID) 75 MCG tablet Take 75 mcg by mouth daily before breakfast.      . Melatonin 3 MG TABS Take 3 mg by mouth.      . pantoprazole (PROTONIX) 40 MG tablet TAKE 1 TABLET BY  MOUTH ONCE DAILY  90 tablet  0  . polyethylene glycol (MIRALAX / GLYCOLAX) packet Take 17 g by mouth daily.       . sertraline (ZOLOFT) 50 MG tablet Take 50 mg by mouth daily.      . carbidopa-levodopa (SINEMET IR) 25-250 MG per tablet Take 1 tablet by mouth 3 (three) times daily.      . mometasone (ASMANEX) 220 MCG/INH inhaler Inhale 2 puffs into the lungs.      . tiotropium (SPIRIVA) 18 MCG inhalation capsule Place 18 mcg into inhaler and inhale as needed.        No current facility-administered medications on file prior to visit.    ALLERGIES: Allergies  Allergen Reactions  . Ibuprofen Other (See Comments)    Patient has a history of ulcers    FAMILY HISTORY: Family History  Problem Relation Age of Onset  . Coronary artery disease Mother   . Stroke Mother   . Heart disease Mother     before age 54  . Hypertension Neg Hx   . Diabetes Neg Hx   . Cancer Neg Hx   . Multiple  sclerosis Sister     SOCIAL HISTORY: History   Social History  . Marital Status: Married    Spouse Name: N/A    Number of Children: 3  . Years of Education: N/A   Occupational History  . Retired Leisure centre manager    Social History Main Topics  . Smoking status: Former Smoker    Start date: 01/11/2013  . Smokeless tobacco: Not on file  . Alcohol Use: No  . Drug Use: No  . Sexual Activity: Not on file   Other Topics Concern  . Not on file   Social History Narrative   Lives with mother-in-law.    REVIEW OF SYSTEMS: Constitutional: No fevers, chills, or sweats, no generalized fatigue, change in appetite Eyes: No visual changes, double vision, eye pain Ear, nose and throat: No hearing loss, ear pain, nasal congestion, sore throat Cardiovascular: No chest pain, palpitations Respiratory:  No shortness of breath at rest or with exertion, wheezes GastrointestinaI: No nausea, vomiting, diarrhea, abdominal pain, fecal incontinence Genitourinary:  No dysuria, urinary retention or frequency Musculoskeletal:  No neck pain, back pain Integumentary: No rash, pruritus, skin lesions Neurological: as above Psychiatric: No depression, insomnia, anxiety Endocrine: No palpitations, fatigue, diaphoresis, mood swings, change in appetite, change in weight, increased thirst Hematologic/Lymphatic:  No anemia, purpura, petechiae. Allergic/Immunologic: no itchy/runny eyes, nasal congestion, recent allergic reactions, rashes  PHYSICAL EXAM: Filed Vitals:   10/04/13 0819  BP: 112/72  Pulse: 84  Resp: 16   General: No acute distress Head:  Normocephalic/atraumatic Neck: supple, no paraspinal tenderness, full range of motion Heart:  Regular rate and rhythm Lungs:  Clear to auscultation bilaterally Back: No paraspinal tenderness Neurological Exam:  Montreal Cognitive Assessment  10/04/2013  Visuospatial/ Executive (0/5) 3  Naming (0/3) 3  Attention: Read list of digits (0/2) 2  Attention: Read  list of letters (0/1) 1  Attention: Serial 7 subtraction starting at 100 (0/3) 3  Language: Repeat phrase (0/2) 1  Language : Fluency (0/1) 0  Abstraction (0/2) 1  Delayed Recall (0/5) 4  Orientation (0/6) 6  Total 24  Adjusted Score (based on education) 25      CN II-XII intact. Fundoscopic exam unremarkable without vessel changes, exudates, hemorrhages or papilledema.  Bulk and tone normal, muscle strength 5/5 throughout.  Sensation to light touch, temperature and vibration intact.  Deep  tendon reflexes 2+ throughout but slightly brisk, toes downgoing.  Finger to nose and heel to shin testing intact.  Gait with normal stride, no ataxia, able to turn.  Walks in tandem with minimal unsteadiness, Romberg negative.  IMPRESSION: PRES in setting of AAA repair, improved.  PLAN: 1. Will taper off amitriptyline.  She will take $RemoveB'25mg'JlFSzfjT$  at bedtime daily for 7 days, then $RemoveBe'25mg'yOrzPSTBZ$  at bedtime every other day for 7 days, then stop. 2.  Continue sertraline and melatonin  3.  Strict blood pressure control and monitoring for kidney function and Mg 4.  Follow up with Dr. Valentina Shaggy for neuropsych testing 5.  Follow up with Dr. Tessa Lerner for rehab 6.  No driving for now 7.  Will draft letter for excuse from [place] duty. 8.  Follow up MRI of the brain with and without contrast at [facility] in September with follow-up with me soon after that  Metta Clines, DO  CC:  Annye Asa, MD

## 2013-10-04 NOTE — Telephone Encounter (Signed)
Caller name: Sue Lushndrea Relation to pt: [facility] Call back number: [phone] Pharmacy:  Reason for call:   Pt is needing a referral placed since she has [employer].  Pt was seen today, 7/21.

## 2013-10-04 NOTE — Telephone Encounter (Signed)
I called patient to give appt information. [facility] Brain w/without contrast on 11/16/13 @ 2:15 needs to arrive 30 min early, with picture id & ins card. Code for parking deck entrance is *[id number]#. All information relayed to patient. Patient verbalized good understanding.

## 2013-10-04 NOTE — Patient Instructions (Signed)
1.  We will slowly go off the amitriptyline.  Take 1/2 tablet at bedtime daily for 7 days, then 1/2 tablet every other night for 7 days, and then stop. 2.  Continue sertraline and melatonin 3.  Follow up with Dr. Leonides CaveZelson and Dr. Hermelinda MedicusSchwartz 4.  No driving for now. 5.  [place] up [employer] note after today. 6.  We will schedule another MRI for September and I will see you in October

## 2013-10-05 ENCOUNTER — Encounter: Payer: Self-pay | Admitting: Neurology

## 2013-10-17 ENCOUNTER — Telehealth: Payer: Self-pay | Admitting: *Deleted

## 2013-10-17 ENCOUNTER — Encounter (HOSPITAL_BASED_OUTPATIENT_CLINIC_OR_DEPARTMENT_OTHER): Payer: Self-pay | Admitting: Emergency Medicine

## 2013-10-17 ENCOUNTER — Emergency Department (HOSPITAL_BASED_OUTPATIENT_CLINIC_OR_DEPARTMENT_OTHER)
Admission: EM | Admit: 2013-10-17 | Discharge: 2013-10-17 | Disposition: A | Discharge status: Home / Self Care | Payer: Medicare HMO | Attending: Emergency Medicine | Admitting: Emergency Medicine

## 2013-10-17 DIAGNOSIS — IMO0002 Reserved for concepts with insufficient information to code with codable children: Secondary | ICD-10-CM | POA: Diagnosis not present

## 2013-10-17 DIAGNOSIS — Z8619 Personal history of other infectious and parasitic diseases: Secondary | ICD-10-CM | POA: Diagnosis not present

## 2013-10-17 DIAGNOSIS — J449 Chronic obstructive pulmonary disease, unspecified: Secondary | ICD-10-CM | POA: Insufficient documentation

## 2013-10-17 DIAGNOSIS — R42 Dizziness and giddiness: Secondary | ICD-10-CM | POA: Insufficient documentation

## 2013-10-17 DIAGNOSIS — E785 Hyperlipidemia, unspecified: Secondary | ICD-10-CM | POA: Diagnosis not present

## 2013-10-17 DIAGNOSIS — K219 Gastro-esophageal reflux disease without esophagitis: Secondary | ICD-10-CM | POA: Diagnosis not present

## 2013-10-17 DIAGNOSIS — Z8739 Personal history of other diseases of the musculoskeletal system and connective tissue: Secondary | ICD-10-CM | POA: Diagnosis not present

## 2013-10-17 DIAGNOSIS — F329 Major depressive disorder, single episode, unspecified: Secondary | ICD-10-CM | POA: Diagnosis not present

## 2013-10-17 DIAGNOSIS — Z79899 Other long term (current) drug therapy: Secondary | ICD-10-CM | POA: Insufficient documentation

## 2013-10-17 DIAGNOSIS — E039 Hypothyroidism, unspecified: Secondary | ICD-10-CM | POA: Insufficient documentation

## 2013-10-17 DIAGNOSIS — N189 Chronic kidney disease, unspecified: Secondary | ICD-10-CM | POA: Insufficient documentation

## 2013-10-17 DIAGNOSIS — R030 Elevated blood-pressure reading, without diagnosis of hypertension: Secondary | ICD-10-CM | POA: Insufficient documentation

## 2013-10-17 DIAGNOSIS — J4489 Other specified chronic obstructive pulmonary disease: Secondary | ICD-10-CM | POA: Insufficient documentation

## 2013-10-17 DIAGNOSIS — F3289 Other specified depressive episodes: Secondary | ICD-10-CM | POA: Insufficient documentation

## 2013-10-17 DIAGNOSIS — Z8701 Personal history of pneumonia (recurrent): Secondary | ICD-10-CM | POA: Insufficient documentation

## 2013-10-17 LAB — URINALYSIS, ROUTINE W REFLEX MICROSCOPIC
Bilirubin Urine: NEGATIVE
Glucose, UA: NEGATIVE mg/dL
Hgb urine dipstick: NEGATIVE
Ketones, ur: NEGATIVE mg/dL
LEUKOCYTES UA: NEGATIVE
Nitrite: NEGATIVE
PROTEIN: NEGATIVE mg/dL
SPECIFIC GRAVITY, URINE: 1.018 (ref 1.005–1.030)
UROBILINOGEN UA: 1 mg/dL (ref 0.0–1.0)
pH: 7.5 (ref 5.0–8.0)

## 2013-10-17 LAB — COMPREHENSIVE METABOLIC PANEL
ALBUMIN: 3.7 g/dL (ref 3.5–5.2)
ALK PHOS: 64 U/L (ref 39–117)
ALT: 10 U/L (ref 0–35)
AST: 23 U/L (ref 0–37)
Anion gap: 11 (ref 5–15)
BUN: 20 mg/dL (ref 6–23)
CHLORIDE: 102 meq/L (ref 96–112)
CO2: 28 mEq/L (ref 19–32)
CREATININE: 1.3 mg/dL — AB (ref 0.50–1.10)
Calcium: 9.7 mg/dL (ref 8.4–10.5)
GFR calc Af Amer: 48 mL/min — ABNORMAL LOW (ref >90)
GFR calc non Af Amer: 41 mL/min — ABNORMAL LOW (ref >90)
Glucose, Bld: 103 mg/dL — ABNORMAL HIGH (ref 70–99)
POTASSIUM: 4.5 meq/L (ref 3.7–5.3)
Sodium: 141 mEq/L (ref 137–147)
Total Protein: 6.7 g/dL (ref 6.0–8.3)

## 2013-10-17 LAB — CBC WITH DIFFERENTIAL/PLATELET
BASOS ABS: 0 10*3/uL (ref 0.0–0.1)
BASOS PCT: 1 % (ref 0–1)
Eosinophils Absolute: 0.1 10*3/uL (ref 0.0–0.7)
Eosinophils Relative: 2 % (ref 0–5)
HCT: 34.6 % — ABNORMAL LOW (ref 36.0–46.0)
Hemoglobin: 11.5 g/dL — ABNORMAL LOW (ref 12.0–15.0)
Lymphocytes Relative: 46 % (ref 12–46)
Lymphs Abs: 2.7 10*3/uL (ref 0.7–4.0)
MCH: 30.8 pg (ref 26.0–34.0)
MCHC: 33.2 g/dL (ref 30.0–36.0)
MCV: 92.8 fL (ref 78.0–100.0)
Monocytes Absolute: 0.6 10*3/uL (ref 0.1–1.0)
Monocytes Relative: 11 % (ref 3–12)
NEUTROS ABS: 2.4 10*3/uL (ref 1.7–7.7)
NEUTROS PCT: 41 % — AB (ref 43–77)
Platelets: 279 10*3/uL (ref 150–400)
RBC: 3.73 MIL/uL — ABNORMAL LOW (ref 3.87–5.11)
RDW: 12.5 % (ref 11.5–15.5)
WBC: 5.9 10*3/uL (ref 4.0–10.5)

## 2013-10-17 LAB — TROPONIN I

## 2013-10-17 NOTE — Discharge Instructions (Signed)
Hypertension Hypertension, commonly called high blood pressure, is when the force of blood pumping through your arteries is too strong. Your arteries are the blood vessels that carry blood from your heart throughout your body. A blood pressure reading consists of a higher number over a lower number, such as 110/72. The higher number (systolic) is the pressure inside your arteries when your heart pumps. The lower number (diastolic) is the pressure inside your arteries when your heart relaxes. Ideally you want your blood pressure below 120/80. Hypertension forces your heart to work harder to pump blood. Your arteries may become narrow or stiff. Having hypertension puts you at risk for heart disease, stroke, and other problems.  RISK FACTORS Some risk factors for high blood pressure are controllable. Others are not.  Risk factors you cannot control include:   Race. You may be at higher risk if you are African American.  Age. Risk increases with age.  Gender. Men are at higher risk than women before age 14 years. After age 62, women are at higher risk than men. Risk factors you can control include:  Not getting enough exercise or physical activity.  Being overweight.  Getting too much fat, sugar, calories, or salt in your diet.  Drinking too much alcohol. SIGNS AND SYMPTOMS Hypertension does not usually cause signs or symptoms. Extremely high blood pressure (hypertensive crisis) may cause headache, anxiety, shortness of breath, and nosebleed. DIAGNOSIS  To check if you have hypertension, your health care provider will measure your blood pressure while you are seated, with your arm held at the level of your heart. It should be measured at least twice using the same arm. Certain conditions can cause a difference in blood pressure between your right and left arms. A blood pressure reading that is higher than normal on one occasion does not mean that you need treatment. If one blood pressure  reading is high, ask your health care provider about having it checked again. TREATMENT  Treating high blood pressure includes making lifestyle changes and possibly taking medicine. Living a healthy lifestyle can help lower high blood pressure. You may need to change some of your habits. Lifestyle changes may include:  Following the DASH diet. This diet is high in fruits, vegetables, and whole grains. It is low in salt, red meat, and added sugars.  Getting at least 2 hours of brisk physical activity every week.  Losing weight if necessary.  Not smoking.  Limiting alcoholic beverages.  Learning ways to reduce stress. If lifestyle changes are not enough to get your blood pressure under control, your health care provider may prescribe medicine. You may need to take more than one. Work closely with your health care provider to understand the risks and benefits. HOME CARE INSTRUCTIONS  Have your blood pressure rechecked as directed by your health care provider.   Take medicines only as directed by your health care provider. Follow the directions carefully. Blood pressure medicines must be taken as prescribed. The medicine does not work as well when you skip doses. Skipping doses also puts you at risk for problems.   Do not smoke.   Monitor your blood pressure at home as directed by your health care provider. SEEK MEDICAL CARE IF:   You think you are having a reaction to medicines taken.  You have recurrent headaches or feel dizzy.  You have swelling in your ankles.  You have trouble with your vision. SEEK IMMEDIATE MEDICAL CARE IF:  You develop a severe headache or confusion.  You have unusual weakness, numbness, or feel faint.  You have severe chest or abdominal pain.  You vomit repeatedly.  You have trouble breathing. MAKE SURE YOU:   Understand these instructions.  Will watch your condition.  Will get help right away if you are not doing well or get  worse. Document Released: 03/03/2005 Document Revised: 07/18/2013 Document Reviewed: 12/24/2012 [facility] Patient Information 2015 [employer] [place]. This information is not intended to replace advice given to you by your health care provider. Make sure you discuss any questions you have with your health care provider.    Blood Pressure Record Sheet Your blood pressure on this visit to the emergency department or clinic is elevated. This does not necessarily mean you have high blood pressure (hypertension), but it does mean that your blood pressure needs to be rechecked. Many times your blood pressure can increase due to illness, pain, anxiety, or other factors. We recommend that you get a series of blood pressure readings done over a period of 5 days. It is best to get a reading in the morning and one in the evening. You should make sure to sit and relax for 1-5 minutes before the reading is taken. Write the readings down and make a follow-up appointment with your health care provider to discuss the results. If there is not a free clinic or a drug store with a blood-pressure-taking machine near you, you can purchase blood-pressure-taking equipment from a drug store. Having one in the home allows you the convenience of taking your blood pressure while you are home and relaxed.  Your blood pressure in the emergency department or clinic on ________ was ____________________. BLOOD PRESSURE LOG Date: _______________________  a.m. _____________________  p.m. _____________________ Date: _______________________  a.m. _____________________  p.m. _____________________ Date: _______________________  a.m. _____________________  p.m. _____________________ Date: _______________________  a.m. _____________________  p.m. _____________________ Date: _______________________  a.m. _____________________  p.m. _____________________ Document Released: 11/30/2002 Document Revised: 07/18/2013 Document  Reviewed: 04/26/2013 Exit[employer] Patient Information 2015 [place] [place]. This information is not intended to replace advice given to you by your health care provider. Make sure you discuss any questions you have with your health care provider.

## 2013-10-17 NOTE — ED Notes (Signed)
Hypertension per home monitor. Husband states it scared him. Pt is slightly dizzy at triage. She is ambulatory.

## 2013-10-17 NOTE — Telephone Encounter (Signed)
Pt called.  Her BP over the last hour has been as follows: 1st time - 145/86 2nd time - 153/82 3rd time - 166/86  Only symptom she complains of is feeling woozy.  While on hold for [facility], pt hung up.  Spoke with Angie, RN with [facility] and she is going to call pt and check on her.

## 2013-10-17 NOTE — ED Provider Notes (Signed)
This chart was scribed for Amy MawKristen N Geddy [place], DO by Amy Bates, ED Scribe. This patient was seen in room MH04/MH04 and the patient's care was started at 6:30 PM.  TIME SEEN: 6:30 PM  CHIEF COMPLAINT: Hypertension   HPI:  Amy Bates is a 68 y.o. female with history of hyperlipidemia, COPD, hypothyroidism, EVAR of a abdominal AAA and left renal artery stent in October 2014 and subsequent PRES who presents to the Emergency Department complaining of hypertension. She states that her blood pressure was high per home monitor and was 166/80. She reports that she normally does not check her blood pressure but she had some lightheadedness while cooking dinner which prompted her to check her blood pressure.  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 Pt's blood pressure in the ED today is 154/71. She denies any chest pain, abdominal pain, back pain, numbness or tingling, focal weakness, vision changes, headache. She does not take her blood pressure medication regularly.  ROS: See HPI Constitutional: no fever  Eyes: no drainage, no new visual disturbance ENT: no runny nose   Cardiovascular:  no chest pain  Resp: no SOB  GI: no vomiting GU: no dysuria Integumentary: no rash  Allergy: no hives  Musculoskeletal: no leg swelling  Neurological: dizziness, no slurred speech, no headache ROS otherwise negative  PAST MEDICAL HISTORY/PAST SURGICAL HISTORY:  Past Medical History  Diagnosis Date  . Arthritis   . Allergic rhinitis   . Asthma   . Depression   . GERD (gastroesophageal reflux disease)   . Hyperlipidemia   . Urinary incontinence   . History of chicken pox   . COPD (chronic obstructive pulmonary disease)   . Pneumonia   . Chest pain     [facility]  . Hypothyroid   . PRES (posterior reversible encephalopathy syndrome)   . Constipation   . Peripheral vascular disease   . Chronic kidney disease      MEDICATIONS:  Prior to Admission medications   Medication Sig Start Date End Date Taking? Authorizing Provider  albuterol (PROAIR HFA) 108 (90 BASE) MCG/ACT inhaler Inhale 2 puffs into the lungs every 6 (six) hours as needed.    Historical Provider, MD  amitriptyline (ELAVIL) 50 MG tablet Take 50 mg by mouth daily. 08/10/13   Historical Provider, MD  atorvastatin (LIPITOR) 40 MG tablet Take 1 tablet (40 mg total) by mouth daily. 01/31/13   Sheliah HatchKatherine E Tabori, MD  carbidopa-levodopa (SINEMET IR) 25-250 MG per tablet Take 1 tablet by mouth 3 (three) times daily. 07/05/13 07/05/14  Historical Provider, MD  fenofibrate 160 MG tablet Take 160 mg by mouth daily.    Historical Provider, MD  levothyroxine (SYNTHROID, LEVOTHROID) 75 MCG tablet Take 75 mcg by mouth daily before breakfast.    Historical Provider, MD  Melatonin 3 MG TABS Take 3 mg by mouth.    Historical Provider, MD  mometasone [facility](ASMANEX) 220 MCG/INH inhaler Inhale 2 puffs into the lungs.    Historical Provider, MD  pantoprazole (PROTONIX) 40 MG tablet TAKE 1 TABLET BY MOUTH ONCE DAILY 09/26/13   Sheliah HatchKatherine E Tabori, MD  polyethylene glycol [facility](MIRALAX / GLYCOLAX) packet Take 17 g by mouth daily.     Historical Provider, MD  sertraline (ZOLOFT) 50 MG tablet Take 50 mg by mouth daily.    Historical Provider, MD  tiotropium (SPIRIVA) 18 MCG inhalation capsule Place 18 mcg into inhaler and inhale  as needed.     Historical Provider, MD    ALLERGIES:  Allergies  Allergen Reactions  . Ibuprofen Other (See Comments)    Patient has a history of ulcers    SOCIAL HISTORY:  History  Substance Use Topics  . Smoking status: Former Smoker    Start date: 01/11/2013  . Smokeless tobacco: Not on file  . Alcohol Use: No    FAMILY HISTORY: Family History  Problem Relation Age of Onset  . Coronary artery disease Mother   . Stroke Mother   . Heart disease Mother     before age 76  . Hypertension Neg Hx   . Diabetes Neg Hx   . Cancer Neg Hx    . Multiple sclerosis Sister     EXAM: BP 154/71  Pulse 72  Temp(Src) 98.3 F (36.8 C)  Resp 16  Ht 5\' 7"  (1.702 m)  Wt 127 lb (57.607 kg)  BMI 19.89 kg/m2  SpO2 100% CONSTITUTIONAL: Alert and oriented and responds appropriately to questions. Well-appearing; well-nourished HEAD: Normocephalic EYES: Conjunctivae clear, PERRL ENT: normal nose; no rhinorrhea; moist mucous membranes; pharynx without lesions noted NECK: Supple, no meningismus, no LAD  CARD: RRR; S1 and S2 appreciated; no murmurs, no clicks, no rubs, no gallops RESP: Normal chest excursion without splinting or tachypnea; breath sounds clear and equal bilaterally; no wheezes, no rhonchi, no rales,  ABD/GI: Normal bowel sounds; non-distended; soft, non-tender, no rebound, no guarding BACK:  The back appears normal and is non-tender to palpation, there is no CVA tenderness EXT: Normal ROM in all joints; non-tender to palpation; no edema; normal capillary refill; no cyanosis    SKIN: Normal color for age and race; warm NEURO: Moves all extremities equally, sensation to light touch intact diffusely, cranial nerves II through XII intact PSYCH: The patient's mood and manner are appropriate. Grooming and personal hygiene are appropriate.  MEDICAL DECISION MAKING: Patient here with hypertension that has resolved.  She is now asymptomatic.  She is not on blood pressure medication. Family reports is because patient has been hypotensive in the past. We'll check basic labs, urine, EKG and continue to monitor her blood pressure.  ED PROGRESS: Patient's blood pressure continues to be stable. She still asymptomatic. Labs, urine and EKG are unremarkable. I feel she is safe to be discharged home. Have discussed with family that recommend checking her blood pressure one time a day when she is feeling well and keeping a log of this information so that they can presented to their primary care physician. If she has mild hypertension, she may need  this treated given she does have a history of a aortic aneurysm repair. I do not feel she needs blood pressure medication given in the ED given she does have a history of hypotension. Discussed strict return precautions with family and patient. They verbalize understanding and are comfortable with plan.     EKG Interpretation  Date/Time:  Monday October 17 2013 18:35:21 EDT Ventricular Rate:  80 PR Interval:  134 QRS Duration: 84 QT Interval:  372 QTC Calculation: 429 R Axis:   77 Text Interpretation:  Normal sinus rhythm Normal ECG Confirmed by Robena Ewy,  DO, Jkai Arwood ([id number]) on 10/17/2013 6:47:55 PM       I personally performed the services described in this documentation, which was scribed in my presence. The recorded information has been reviewed and is accurate.      Amy Maw Dezmond Downie, DO 10/17/13 1943

## 2013-10-24 ENCOUNTER — Encounter: Payer: Self-pay | Admitting: Family Medicine

## 2013-10-24 ENCOUNTER — Ambulatory Visit (INDEPENDENT_AMBULATORY_CARE_PROVIDER_SITE_OTHER): Payer: Commercial Managed Care - HMO | Admitting: Family Medicine

## 2013-10-24 VITALS — BP 128/70 | HR 70 | Temp 97.6°F | Wt 128.8 lb

## 2013-10-24 DIAGNOSIS — R030 Elevated blood-pressure reading, without diagnosis of hypertension: Secondary | ICD-10-CM

## 2013-10-24 DIAGNOSIS — IMO0001 Reserved for inherently not codable concepts without codable children: Secondary | ICD-10-CM

## 2013-10-24 NOTE — Patient Instructions (Signed)
Follow up in 4-6 weeks to recheck BP Try and limit the salt in your diet- processed meats, frozen dinners, soups, added salt Drink plenty of water If your BP is consistently higher than 145/90, please call me Call with any questions or concerns Hang in there!!

## 2013-10-24 NOTE — Progress Notes (Signed)
   Subjective:    Patient ID: Amy Bates, female    DOB: 10/26/1945, 68 y.o.   MRN: [id number]  HPI ER f/u- pt went to ER on 8/3 w/ elevated BP (166/89 at home).  Pt had some 'wooziness'.  Pt was d/c'd home w/o BP meds due to hx of hypotension.  No CP, SOB.  Woke w/ HA this AM- 'first HA in awhile'.  Home readings range 139-161/75-89.  HA this AM is frontal.  No nausea.  Pt admits to high salt diet recently.   Review of Systems For ROS see HPI     Objective:   Physical Exam  Vitals reviewed. Constitutional: She is oriented to person, place, and time. She appears well-developed and well-nourished. No distress.  HENT:  Head: Normocephalic and atraumatic.  Eyes: Conjunctivae and EOM are normal. Pupils are equal, round, and reactive to light.  Neck: Normal range of motion. Neck supple. No thyromegaly present.  Cardiovascular: Normal rate, regular rhythm, normal heart sounds and intact distal pulses.   No murmur heard. Pulmonary/Chest: Effort normal and breath sounds normal. No respiratory distress.  Abdominal: Soft. She exhibits no distension. There is no tenderness.  Musculoskeletal: She exhibits no edema.  Lymphadenopathy:    She has no cervical adenopathy.  Neurological: She is alert and oriented to person, place, and time.  Skin: Skin is warm and dry.  Psychiatric: She has a normal mood and affect. Her behavior is normal.          Assessment & Plan:

## 2013-10-24 NOTE — Assessment & Plan Note (Signed)
New.  BP is normal in office today.  Reviewed ER notes from 8/3 and pt's home BP readings.  Pt admits to eating high salt diet recently- stressed the need to stop this.  Reviewed foods high in sodium and that she should avoid these.  Pt and family are hesitant to start medications as she has hx of hypotension- I am in agreement.  At this time, we will not add a medication but will follow BP closely.  Pt expressed understanding and is in agreement w/ plan.

## 2013-10-24 NOTE — Progress Notes (Signed)
Pre visit review using our clinic review tool, if applicable. No additional management support is needed unless otherwise documented below in the visit note. 

## 2013-11-01 DIAGNOSIS — G9349 Other encephalopathy: Secondary | ICD-10-CM | POA: Diagnosis not present

## 2013-11-01 DIAGNOSIS — G3184 Mild cognitive impairment, so stated: Secondary | ICD-10-CM | POA: Diagnosis not present

## 2013-11-07 ENCOUNTER — Encounter: Payer: Self-pay | Admitting: Physical Medicine & Rehabilitation

## 2013-11-07 ENCOUNTER — Encounter: Payer: Medicare HMO | Attending: Physical Medicine & Rehabilitation | Admitting: Physical Medicine & Rehabilitation

## 2013-11-07 VITALS — BP 155/76 | HR 62 | Resp 14

## 2013-11-07 DIAGNOSIS — I6783 Posterior reversible encephalopathy syndrome: Secondary | ICD-10-CM

## 2013-11-07 DIAGNOSIS — R569 Unspecified convulsions: Secondary | ICD-10-CM

## 2013-11-07 DIAGNOSIS — G931 Anoxic brain damage, not elsewhere classified: Secondary | ICD-10-CM | POA: Insufficient documentation

## 2013-11-07 DIAGNOSIS — G9349 Other encephalopathy: Secondary | ICD-10-CM | POA: Diagnosis present

## 2013-11-07 NOTE — Patient Instructions (Signed)
WORK ON BETTER SLEEP PATTERNS: 9:30PM TO 10PM BEDTIME INCREASE MELATONIN TO  AT BEDTIME--TAKE WITH ZOLOFT  I WILL DISCUSS YOUR CASE WITH DR. Bradly Chris WOULD LIKE FOR YOU TO START THERAPY WITH HIM  WORK ON ESTABLISHING SOME GOALS AND A DAILY ROUTINE-----I WANT YOU OUT OF YOUR CHAIR.!!!!!!!   WE WILL CONSIDER A STIMULANT OR SOMETHING TO HELP WITH YOUR MOOD DEPENDING ON HOW THINGS GO.

## 2013-11-07 NOTE — Progress Notes (Signed)
Subjective:    Patient ID: Amy Bates, female    DOB: 18-Aug-1945, 68 y.o.   MRN: [id number]  HPI This is an initial visit for Amy Bates, a 68 year old white female, who has chronic headaches and memory loss,  hospitalized earlier this year for PRES. Her husband reports the problems began after an infrarenal AAA repair in 10/14. Since then, she developed increased confusion and reported depression. She came back to the hospital in December of last year with further cognitive decline, and work up was essentially negative. CT revealed chronic SVD. Severe headaches were also reported during this time.  Early this year she continued to decline further with apparent worsening depression requiring psychiatric intervention. She was placed on abilify in addition to her antidepressant. Husband reports a fall on 3/13 this year where she briefly lost consciousness and seizure activity was noted--it was felt that her wellbutrin contributed to the presentation and this was ultimately stopped. A CT was questionable for a SDH at that time, but follow up imagine was most consistent with PRES.   She was subsequently hospitalized at [facility] on 06/08/13 for worsening mental status and aphasia. With her history of metal in her jaw, they used a low-magnet MRI of the brain with and without contrast, which revealed diffuse cerebral edema with central mass effect on the lateral ventricles, sequela of PRES versus encephalitis. There was also evidence of acute lacunar infarct within the posterior left periventricular white matter. She was subsequently admitted to the [facility] center for an inpatient rehab stay.   As far as I can tell, infectious and autoimmune etiologies were ruled out. She currently remains on only zoloft for depression---however, both the patient and her husband deny depression.  She DOES report emotional lability and cognitive deficits, however. Amy Bates reports difficulties in the relationship with her  husband as a result. She often will "fly off the handle" or will become irritated with her husband. She also notes that she has spontaneous "happy" emotions as well.  Additionally, she reports problems with concentration, STM,  and word finding at times as well. She is undergoing  neuropsych testing through Dr. Leonides Cave. She has been involved with speech therapy at [facility]. She has not had formal psychological treatment.  Another big issue is sleep----she takes melatonin at night for sleep, typically around 7-8pm with her zoloft.  She often falls asleep early in the evening and will frequently wake up early in the morning (0200-0300). She may drift off for a few minutes after she's up but never falls back to a deep sleep.  When her husband gets up, he tries to motivate her to do things. He says that all she really wants to do is sit in her chair and watch TV. She may occasionally get up to go fishing with him. Her husband does try to walk and exercise her a little bit every day.   Amy Bates denies any issues with her balance. Appetite is fair. Her husband is exhausted and frustrated that things have generally deteriorated and is looking for help.  Amy Bates is a former smoker and coffee drinker.    Pain Inventory Average Pain 0 Pain Right Now 0 My pain is no pain  In the last 24 hours, has pain interfered with the following? General activity 0 Relation with others 0 Enjoyment of life 0 What TIME of day is your pain at its worst? no pain Sleep (in general) Fair  Pain is worse with: no  pain Pain improves with: no pain Relief from Meds: no pain  Mobility walk without assistance how many minutes can you walk? 15 ability to climb steps?  yes do you drive?  no  Function not employed: date last employed 2004 disabled: date disabled . retired  Neuro/Psych bowel control problems depression  Prior Studies Any changes since last visit?  no  Physicians involved in your  care Any changes since last visit?  no   Family History  Problem Relation Age of Onset  . Coronary artery disease Mother   . Stroke Mother   . Heart disease Mother     before age 88  . Hypertension Neg Hx   . Diabetes Neg Hx   . Cancer Neg Hx   . Multiple sclerosis Sister    History   Social History  . Marital Status: Married    Spouse Name: N/A    Number of Children: 3  . Years of Education: N/A   Occupational History  . Retired Leisure centre manager    Social History Main Topics  . Smoking status: Former Smoker    Start date: 01/11/2013  . Smokeless tobacco: None  . Alcohol Use: No  . Drug Use: No  . Sexual Activity: None   Other Topics Concern  . None   Social History Narrative   Lives with mother-in-law.   Past Surgical History  Procedure Laterality Date  . Tubal ligation    . Tonsillectomy    . Knee surgery      right  . Endovascular stent insertion N/A 01/11/2013    Procedure: ENDOVASCULAR STENT GRAFT INSERTION; ULTRASOUND GUIDED, Renal Stent left renal artery , open left brachial artery closure.;  Surgeon: Nada Libman, MD;  Location: [facility] OR;  Service: Vascular;  Laterality: N/A;  . Abdominal aortic aneurysm repair    . Joint replacement     Past Medical History  Diagnosis Date  . Arthritis   . Allergic rhinitis   . Asthma   . Depression   . GERD (gastroesophageal reflux disease)   . Hyperlipidemia   . Urinary incontinence   . History of chicken pox   . COPD (chronic obstructive pulmonary disease)   . Pneumonia   . Chest pain     [facility]  . Hypothyroid   . PRES (posterior reversible encephalopathy syndrome)   . Constipation   . Peripheral vascular disease   . Chronic kidney disease    BP 155/76  Pulse 62  Resp 14  SpO2 96%  Opioid Risk Score:   Fall Risk Score: Low Fall Risk (0-5 points) Review of Systems  Constitutional: Positive for unexpected weight change.  Gastrointestinal:       Constipation alt with diarrhea    Psychiatric/Behavioral: Positive for dysphoric mood.  All other systems reviewed and are negative.      Objective:   Physical Exam  General: Alert and oriented x 3, No apparent distress HEENT: Head is normocephalic, atraumatic, PERRLA, EOMI, sclera anicteric, oral mucosa pink and moist, dentition intact, ext ear canals clear,  Neck: Supple without JVD or lymphadenopathy Heart: Reg rate and rhythm. No murmurs rubs or gallops Chest: CTA bilaterally without wheezes, rales, or rhonchi; no distress Abdomen: Soft, non-tender, non-distended, bowel sounds positive. Extremities: No clubbing, cyanosis, or edema. Pulses are 2+ Skin: Clean and intact without signs of breakdown Neuro: Pt is A and O x3 with a little delay. She occasionally displayed word finding deficits, but it did not affect her communication. She recalled 0/3  words after 5 minutes. Abstract thinking was quite literal. She had difficulty with sequencing letters, numbers. Had problems with basic algebra level math. She recalled what the weather forecast was from the news this morning and remembered some other current affairs with cueing from her husband. Strength was normal. Sensation normal. DTR's 1+. Balance was fair to good with normal gait, but she did display some shuffling in her gait and she tended to lean forward with her trunk. No tremor was seen. [facility] was reasonable. She had minimal loss of balance with Romberg testing (only with push did she lose some balance). No rigidity.  Musculoskeletal: Full ROM, No pain with AROM or PROM in the neck, trunk, or extremities. Posture appropriate Psych: Pt's affect was appropriate. Pt is cooperative. She did become tearful, spontaneously, while her husband was describing one of their confrontations and problem moments. She did not appear depressed otherwise           Assessment & Plan:  1. Chronic cognitive and behavioral deficits related to PRES and likely anoxic brain injury (also  ?mild traumatic head trauma in March). Most specifically her problems relate to short term memory, attention, concentration, emotional lability.   Plan: 1. Complete Outpt neuropsychological counseling. I will speak with Dr. Leonides Cave and SLP regarding their interactions with this patient. I would like to have Amy Bates (and her husband) begin formal psychological counseling to better understand and cope with her issues on a day in and out basis. 2. HEP consisting of ongoing cognitive retraining per SLP. Also challenged the patient to be more diligent with her HEP.  3. Re-establish better sleep pattern. (target 8-10 hours per night from 9-10pm to 6-7am). She will work on this gradually. Recommending increasing melatonin to  initially.  4. Asked the patient to begin working on establishing some goals and a routine---it may be church related, leisure, vocational, physical exercise, whatever 5. Continue with zoloft daily 6. Consider stimulant and/or a mood stabilizer, but I would prefer not to if we can. 7. MRI pending per neurology.   I will see the patient back in 4-6 weeks. One hour of face to face patient care time was spent during this visit. All questions were encouraged and answered.

## 2013-11-09 DIAGNOSIS — G3184 Mild cognitive impairment, so stated: Secondary | ICD-10-CM | POA: Diagnosis not present

## 2013-11-09 DIAGNOSIS — F482 Pseudobulbar affect: Secondary | ICD-10-CM | POA: Diagnosis not present

## 2013-11-09 DIAGNOSIS — G9349 Other encephalopathy: Secondary | ICD-10-CM | POA: Diagnosis not present

## 2013-11-15 DIAGNOSIS — G3184 Mild cognitive impairment, so stated: Secondary | ICD-10-CM

## 2013-11-15 DIAGNOSIS — F482 Pseudobulbar affect: Secondary | ICD-10-CM

## 2013-11-15 DIAGNOSIS — G9349 Other encephalopathy: Secondary | ICD-10-CM

## 2013-11-16 ENCOUNTER — Telehealth: Payer: Self-pay

## 2013-11-16 NOTE — Telephone Encounter (Signed)
Caller name:Imanni Relation to pt: Call back [phone] Pharmacy:[facility]  Reason for call: Tuleen called and wanted to see if Dr Beverely Low could give her a refill on her sertraline (ZOLOFT) 50 MG tablet

## 2013-11-17 MED ORDER — SERTRALINE HCL 50 MG PO TABS: 50.0000 mg | ORAL_TABLET | Freq: Every day | ORAL | Status: DC

## 2013-11-17 NOTE — Telephone Encounter (Signed)
Please advise if ok to fill, I thought she was getting this from another provider.

## 2013-11-17 NOTE — Telephone Encounter (Signed)
Ok for refill, #30, 6 refills

## 2013-11-17 NOTE — Telephone Encounter (Signed)
Medications filled.  

## 2013-11-22 ENCOUNTER — Other Ambulatory Visit: Payer: Self-pay | Admitting: Family Medicine

## 2013-11-23 NOTE — Telephone Encounter (Signed)
Med filled.  

## 2013-11-28 ENCOUNTER — Encounter: Payer: Self-pay | Admitting: Neurology

## 2013-11-28 ENCOUNTER — Ambulatory Visit (INDEPENDENT_AMBULATORY_CARE_PROVIDER_SITE_OTHER): Payer: Commercial Managed Care - HMO | Admitting: Neurology

## 2013-11-28 VITALS — BP 140/70 | HR 74 | Temp 97.0°F | Resp 16 | Wt 129.0 lb

## 2013-11-28 DIAGNOSIS — I6783 Posterior reversible encephalopathy syndrome: Secondary | ICD-10-CM

## 2013-11-28 DIAGNOSIS — G931 Anoxic brain damage, not elsewhere classified: Secondary | ICD-10-CM

## 2013-11-28 DIAGNOSIS — F482 Pseudobulbar affect: Secondary | ICD-10-CM

## 2013-11-28 DIAGNOSIS — G9349 Other encephalopathy: Secondary | ICD-10-CM

## 2013-11-28 MED ORDER — DEXTROMETHORPHAN-QUINIDINE 20-10 MG PO CAPS: ORAL_CAPSULE | ORAL | Status: DC

## 2013-11-28 NOTE — Patient Instructions (Signed)
1.  We will hold the Zoloft for now and try Nuedexta for the emotional lability.  Take 1 capsule daily for 7 days, then 1 capsule twice daily. 2.  Follow up in 6 weeks.

## 2013-11-28 NOTE — Progress Notes (Addendum)
NEUROLOGY FOLLOW UP OFFICE NOTE  Amy Bates [id number]  HISTORY OF PRESENT ILLNESS: Amy Bates is a 68 year old right-handed woman with history of COPD, hypothyroidism, hyperlipidemia, depression, renal artery occlusion, metal in jaw, and abdominal aortic aneurysm who follows up for PRES and possible anoxic brain injury.  She is accompanied by her husband.     UPDATE: She has established care with Dr. Tessa Lerner for rehab.  She is to undergo neuropsychological counseling with Dr. Valentina Shaggy.  She had formal neuropsychological testing with Dr. Valentina Shaggy in August.  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  She exhibited borderline-range verbal comprehension and auditory working memory.  She exhibited impaired executive functioning.    Over the past month, she has had increased emotional lability and often cries easily.  She is a little more irritable at times.  She is not really depressed most of the time, however.  She reports increased word-finding difficulty.  She has had fluctuations with elevated blood pressure with systolic pressure in the 175Z.  Over the past month, it has been in the 120s-140s/50s-80s.  She had a repeat MRI of the brain performed at [facility] on 11/16/13 (actually scans not available for review): 1. Substantial interval improvement in the previous confluent T2/FLAIR signal abnormality distributed throughout the bilateral cerebral white matter with posterior predominance, most likely reflecting resolving PRES (posterior reversible encephalopathy syndrome). There has been a concomitant decrease in sulcal and ventricular effacement. 2. Persistent areas of periventricular and subcortical T2 hyperintensity likely reflect background of chronic microvascular ischemic white matter changes, although a component of residual PRES-related signal abnormality could have this appearance. 3. Innumerable foci of susceptibility artifact along the cortex and  at the gray-white interface of the bilateral cerebrum are most consistent with microhemorrhages. Increased number of these foci suggests worsening from prior, although this could partially be related to differences in technique.  Medications include Zoloft for depression and melatonin to improve better sleep pattern.   HISTORY: She was found to have a 5.4 x 4.8cm unruptured infrarenal abdominal aortic aneurysm after experiencing three weeks of abdominal pain.  She underwent endovascular repair and stenting on 01/11/13.   Following the procedure, she began feeling confused.  She initially began having word-finding difficulties where she would lose track of thought and would fumble for the words.  She understands others and is able to read without difficulty.  This was intermittent but becoming more frequent.  Whenever she has word-finding difficulties, her husband will start asking her questions to test her.  She presented to the ED on 03/15/13 because she was not able to tell her husband the year or her daughters' names.  CT Head revealed chronic small vessel ischemic changes, but no evidence of acute infarct, mass lesion or hemorrhage.  She was diagnosed with an upper respiratory infection.  She also began experiencing bi-frontal and retro-orbital headaches, described as pounding, about 6/10 (not worse headache of her life) and not associated with nausea, vomiting, photophobia, phonophobia, or visual disturbance.  She denies neck or shoulder pain.  It would occur about 3 days a week and would last a couple of hours.  Onset seems to occur later in the day.  It is not positional.  She treats it with acetaminophen, which helps.  She denies vision problems.  She continued to have a deterioration of depressive symptoms.  She was placed on Abilify and her psychiatrist later started Wellbutrin.  She was admitted to [facility] on 05/27/13 for altered mental  status.  She had a fall.  There was mentioning of  possible seizure activity, described as convulsive movements with frothing at the mouth and cyanosis of the lips.  Labs on admission revealed Na 136, K 3.5, Cl 95, bicarb 24, glucose 122, BUN 13, Cr 1.10, Ca 9.6, TP 7.4, Alb 4.1, AST 24, ALT 11, AP 53, TB 0.5, WBC 11.5, Hgb 13.8, Hct 39.2, PLT 251, Leukocytosis was thought to be secondary to fall.  Otherwise, labs did not reveal sign of infection.  She was not hypertensive.  Initial CT of the head showed possible thin isodense subdural hematoma, but was thought to be artifact, as repeat imaging did not reveal this.  However, the repeat CT of the head revealed confluent generalized white matter hypodensities in the cerebrum, with reduced conspicuity of the sulci when compared to prior CT on 03/15/13, thought was mentioned to be associated with PRES.  EEG revealed generalized background slowing but no seizure or epileptiform activity.  Wellbutrin was discontinued.    She was subsequently hospitalized at [facility] on 06/08/13 for worsening mental status and aphasia.  With her history of metal in her jaw, they used a low-magnet MRI of the brain with and without contrast, which revealed diffuse cerebral edema with central mass effect on the lateral ventricles, sequela of PRES versus encephalitis.  There was also evidence of acute lacunar infarct within the posterior left periventricular white matter.  She underwent long-term EEG monitoring which revealed generalized slowing but no seizures or epileptiform activity.  She underwent workup for infectious, paraneoplastic, autoimmune and demyelinating etiologies.  She underwent LP, which revealed cell count 1, CMV PCR negative, EBV-PCR negative, VZV PCR negative, HSV PCR negative, gram stain and culture negative, cryptococal antigen negative, lactate 1.3, protein 87, glucose 63.  JCV antibody negative.  OCBs were noted in CSF and serum.  Other labs included NMDA receptor antibody negative, paraneoplastic panel negative.  B1  was 81.  ESR 20.  CRP 4.1.  Mg 1.8.  Phosphorus 3.5.  UA was positive.  CT of chest/abdomen/pelvis did not reveal cancer.  Blood pressure was managed, but she did not improve.  It was suspected that she may have an autoimmune encephalitis, so she was treated with Solumedrol for 3 days.  PLEX was considered but held because she improved on the steroids.    Ultimately, she exhibited cognitive deficits, as demonstrated by poor memory, insight, attention and concentration.  She was discharged to Rehab at [facility], where she showed much improvement.  Abilify was stopped.  She exhibited a short steppage gait with flexed posture, so Sinemet was initiated.  She was placed on Seroquel and Zoloft.  She developed marked improvement in cognitive deficits, however memory was still an issue.  Gait improved, so Sinemet was discontinued.  She was taking amitriptyline for headache, which has since been discontinued.  She has a history of depression, but she has had significant increase in depression since the surgery, and pseudobulbar affect.   Family history is significant for father with Alzheimer's disease and aunt with cerebral aneurysm.  PAST MEDICAL HISTORY: Past Medical History  Diagnosis Date  . Arthritis   . Allergic rhinitis   . Asthma   . Depression   . GERD (gastroesophageal reflux disease)   . Hyperlipidemia   . Urinary incontinence   . History of chicken pox   . COPD (chronic obstructive pulmonary disease)   . Pneumonia   . Chest pain     [facility]  . Hypothyroid   .  PRES (posterior reversible encephalopathy syndrome)   . Constipation   . Peripheral vascular disease   . Chronic kidney disease     MEDICATIONS: Current Outpatient Prescriptions on File Prior to Visit  Medication Sig Dispense Refill  . albuterol (PROAIR HFA) 108 (90 BASE) MCG/ACT inhaler Inhale 2 puffs into the lungs every 6 (six) hours as needed.      Marland Kitchen atorvastatin (LIPITOR) 40 MG tablet Take 1 tablet (40 mg total)  by mouth daily.  90 tablet  3  . fenofibrate 160 MG tablet Take 160 mg by mouth daily.      Marland Kitchen levothyroxine (SYNTHROID, LEVOTHROID) 75 MCG tablet TAKE 1 TABLET BY MOUTH EVERY DAY  30 tablet  6  . Melatonin 3 MG TABS Take 3 mg by mouth.      . mometasone (ASMANEX) 220 MCG/INH inhaler Inhale 2 puffs into the lungs.      . pantoprazole (PROTONIX) 40 MG tablet TAKE 1 TABLET BY MOUTH ONCE DAILY  90 tablet  0  . polyethylene glycol (MIRALAX / GLYCOLAX) packet Take 17 g by mouth daily.       . sertraline (ZOLOFT) 50 MG tablet Take 1 tablet (50 mg total) by mouth daily.  30 tablet  6  . tiotropium (SPIRIVA) 18 MCG inhalation capsule Place 18 mcg into inhaler and inhale as needed.        No current facility-administered medications on file prior to visit.    ALLERGIES: Allergies  Allergen Reactions  . Ibuprofen Other (See Comments)    Patient has a history of ulcers    FAMILY HISTORY: Family History  Problem Relation Age of Onset  . Coronary artery disease Mother   . Stroke Mother   . Heart disease Mother     before age 75  . Hypertension Neg Hx   . Diabetes Neg Hx   . Cancer Neg Hx   . Multiple sclerosis Sister     SOCIAL HISTORY: History   Social History  . Marital Status: Married    Spouse Name: N/A    Number of Children: 3  . Years of Education: N/A   Occupational History  . Retired Chief Operating Officer    Social History Main Topics  . Smoking status: Former Smoker    Start date: 01/11/2013  . Smokeless tobacco: Not on file  . Alcohol Use: No  . Drug Use: No  . Sexual Activity: No   Other Topics Concern  . Not on file   Social History Narrative   Lives with mother-in-law.    REVIEW OF SYSTEMS: Constitutional: No fevers, chills, or sweats, no generalized fatigue, change in appetite Eyes: No visual changes, double vision, eye pain Ear, nose and throat: No hearing loss, ear pain, nasal congestion, sore throat Cardiovascular: No chest pain, palpitations Respiratory:  No  shortness of breath at rest or with exertion, wheezes GastrointestinaI: No nausea, vomiting, diarrhea, abdominal pain, fecal incontinence Genitourinary:  No dysuria, urinary retention or frequency Musculoskeletal:  No neck pain, back pain Integumentary: No rash, pruritus, skin lesions Neurological: as above Psychiatric: emotional lability Endocrine: No palpitations, fatigue, diaphoresis, mood swings, change in appetite, change in weight, increased thirst Hematologic/Lymphatic:  No anemia, purpura, petechiae. Allergic/Immunologic: no itchy/runny eyes, nasal congestion, recent allergic reactions, rashes  PHYSICAL EXAM: Filed Vitals:   11/28/13 1314  BP: 140/70  Pulse: 74  Temp: 97 F (36.1 C)  Resp: 16   General: No acute distress Head:  Normocephalic/atraumatic Neck: supple, no paraspinal tenderness, full range of motion  Heart:  Regular rate and rhythm Lungs:  Clear to auscultation bilaterally Back: No paraspinal tenderness Neurological Exam: alert and oriented to person, place, and time. Attention span and concentration intact, recent and remote memory intact, fund of knowledge intact.   Montreal Cognitive Assessment  11/28/2013 10/04/2013  Visuospatial/ Executive (0/5) 3 3  Naming (0/3) 3 3  Attention: Read list of digits (0/2) 2 2  Attention: Read list of letters (0/1) 1 1  Attention: Serial 7 subtraction starting at 100 (0/3) 3 3  Language: Repeat phrase (0/2) 1 1  Language : Fluency (0/1) 0 0  Abstraction (0/2) 1 1  Delayed Recall (0/5) 5 4  Orientation (0/6) 6 6  Total 25 24  Adjusted Score (based on education) 26 25  Speech fluent and not dysarthric, sometimes with pauses due to word-finding, language intact.  CN II-XII intact. Fundi not visualized.  Bulk and tone normal, muscle strength 5/5 throughout.  Sensation to light touch, temperature and vibration intact.  Deep tendon reflexes 2+ throughout, toes downgoing.  Finger to nose testing intact.  Gait normal, Romberg  negative.  IMPRESSION: PRES.  Recent MRI reveals interval improvement. Cognitive impairment due to PRES and possible anoxic brain injury from time of surgery. Pseudobulbar affect  PLAN: 1.  She will continue the Zoloft $RemoveBe'50mg'zpagSPsgh$ .  Will start Nuedexta for pseudobulbar affect.  If no improvement by next visit, then we can discontinue the Nuedexta.   2.  Follow up in 6 weeks.  Metta Clines, DO  CC:  Annye Asa, MD

## 2013-11-29 DIAGNOSIS — G9349 Other encephalopathy: Secondary | ICD-10-CM

## 2013-11-29 DIAGNOSIS — G3184 Mild cognitive impairment, so stated: Secondary | ICD-10-CM

## 2013-11-29 DIAGNOSIS — F4321 Adjustment disorder with depressed mood: Secondary | ICD-10-CM

## 2013-11-29 DIAGNOSIS — F482 Pseudobulbar affect: Secondary | ICD-10-CM

## 2013-11-29 MED ORDER — SERTRALINE HCL 50 MG PO TABS
50.0000 mg | ORAL_TABLET | Freq: Every day | ORAL | Status: DC
Start: 2013-11-29 — End: 2014-01-17

## 2013-11-29 NOTE — Addendum Note (Signed)
Addended byEverlena Cooper, Hayde Kilgour R on: 11/29/2013 07:08 AM   Modules accepted: Orders

## 2013-12-03 ENCOUNTER — Other Ambulatory Visit: Payer: Self-pay | Admitting: Family Medicine

## 2013-12-05 NOTE — Telephone Encounter (Signed)
Med filled.  

## 2013-12-13 DIAGNOSIS — F482 Pseudobulbar affect: Secondary | ICD-10-CM

## 2013-12-13 DIAGNOSIS — G9349 Other encephalopathy: Secondary | ICD-10-CM

## 2013-12-13 DIAGNOSIS — G3184 Mild cognitive impairment, so stated: Secondary | ICD-10-CM

## 2013-12-13 DIAGNOSIS — F4321 Adjustment disorder with depressed mood: Secondary | ICD-10-CM

## 2013-12-19 ENCOUNTER — Encounter: Payer: Self-pay | Admitting: Physical Medicine & Rehabilitation

## 2013-12-19 ENCOUNTER — Encounter
Payer: Commercial Managed Care - HMO | Attending: Physical Medicine & Rehabilitation | Admitting: Physical Medicine & Rehabilitation

## 2013-12-19 VITALS — BP 155/81 | HR 63 | Resp 14 | Wt 127.0 lb

## 2013-12-19 DIAGNOSIS — R413 Other amnesia: Secondary | ICD-10-CM | POA: Diagnosis not present

## 2013-12-19 DIAGNOSIS — I69898 Other sequelae of other cerebrovascular disease: Secondary | ICD-10-CM | POA: Insufficient documentation

## 2013-12-19 DIAGNOSIS — G931 Anoxic brain damage, not elsewhere classified: Secondary | ICD-10-CM

## 2013-12-19 DIAGNOSIS — F482 Pseudobulbar affect: Secondary | ICD-10-CM

## 2013-12-19 DIAGNOSIS — I6783 Posterior reversible encephalopathy syndrome: Secondary | ICD-10-CM

## 2013-12-19 NOTE — Progress Notes (Signed)
Subjective:    Patient ID: Amy Bates, female    DOB: 1945/12/16, 68 y.o.   MRN: [id number]  HPI  Lynnett is here regarding follow up of her PRES. Her recent MRI from [facility] showed improvement. Dr. Everlena Cooper started her on Nuedexta for pseudobulbar affect which has helped her emotional status greatly.   She is participating in a HEP for her cognitive issues. She is showing improvement there as well. Her husband is diligent about continuing to work on exercises at home.   Sleep can be a problem. She often is woken up by her bladder. Sometimes she urinates 5-6 x per night.   She still hasn't gotten out of the house very often. She has been hesitant recently due to the rainy weather we've had.    Pain Inventory Average Pain 2 Pain Right Now 0 My pain is dull  In the last 24 hours, has pain interfered with the following? General activity 0 Relation with others 0 Enjoyment of life 0 What TIME of day is your pain at its worst? morning Sleep (in general) Fair  Pain is worse with: some activites Pain improves with: medication Relief from Meds: 4  Mobility walk without assistance how many minutes can you walk? 30  Function disabled: date disabled 2004  Neuro/Psych bowel control problems  Prior Studies Any changes since last visit?  no  Physicians involved in your care Any changes since last visit?  no   Family History  Problem Relation Age of Onset  . Coronary artery disease Mother   . Stroke Mother   . Heart disease Mother     before age 26  . Hypertension Neg Hx   . Diabetes Neg Hx   . Cancer Neg Hx   . Multiple sclerosis Sister    History   Social History  . Marital Status: Married    Spouse Name: N/A    Number of Children: 3  . Years of Education: N/A   Occupational History  . Retired Leisure centre manager    Social History Main Topics  . Smoking status: Former Smoker    Start date: 01/11/2013  . Smokeless tobacco: None  . Alcohol Use: No  . Drug Use: No  .  Sexual Activity: No   Other Topics Concern  . None   Social History Narrative   Lives with mother-in-law.   Past Surgical History  Procedure Laterality Date  . Tubal ligation    . Tonsillectomy    . Knee surgery      right  . Endovascular stent insertion N/A 01/11/2013    Procedure: ENDOVASCULAR STENT GRAFT INSERTION; ULTRASOUND GUIDED, Renal Stent left renal artery , open left brachial artery closure.;  Surgeon: Nada Libman, MD;  Location: [facility] OR;  Service: Vascular;  Laterality: N/A;  . Abdominal aortic aneurysm repair    . Joint replacement     Past Medical History  Diagnosis Date  . Arthritis   . Allergic rhinitis   . Asthma   . Depression   . GERD (gastroesophageal reflux disease)   . Hyperlipidemia   . Urinary incontinence   . History of chicken pox   . COPD (chronic obstructive pulmonary disease)   . Pneumonia   . Chest pain     [facility]  . Hypothyroid   . PRES (posterior reversible encephalopathy syndrome)   . Constipation   . Peripheral vascular disease   . Chronic kidney disease    BP 155/81  Pulse 63  Resp 14  Wt 127 lb (57.607 kg)  SpO2 97%  Opioid Risk Score:   Fall Risk Score: Moderate Fall Risk (6-13 points) (previoulsy educated and given handout for fall prevention)   Review of Systems  Gastrointestinal: Positive for diarrhea and constipation.  All other systems reviewed and are negative.      Objective:   Physical Exam  General: Alert and oriented x 3, No apparent distress  HEENT: Head is normocephalic, atraumatic, PERRLA, EOMI, sclera anicteric, oral mucosa pink and moist, dentition intact, ext ear canals clear,  Neck: Supple without JVD or lymphadenopathy  Heart: Reg rate and rhythm. No murmurs rubs or gallops  Chest: CTA bilaterally without wheezes, rales, or rhonchi; no distress  Abdomen: Soft, non-tender, non-distended, bowel sounds positive.  Extremities: No clubbing, cyanosis, or edema. Pulses are 2+  Skin: Clean and  intact without signs of breakdown  Neuro: Pt is A and O x3 She recalled 3/3 words after 5 minutes. Abstract thinking, attention, and awareness was much improved.   Musculoskeletal: Full ROM, No pain with AROM or PROM in the neck, trunk, or extremities. Posture appropriate  Psych: Pt's affect was appropriate. Pt is cooperative. She was tearful only once. Otherwise her mood was very up beat.   Assessment & Plan:   1. Chronic cognitive and behavioral deficits related to PRES and likely anoxic brain injury (also ?mild traumatic head trauma in March). Most specifically her problems relate to short term memory, attention, concentration, emotional lability.    Plan:  1. At this point, Dr. Everlena CooperJaffe is providing appropriate care. I don't have much else to add to the picture. If she should have any problems, I would be happy to see her back here. Otherwise: 2. HEP consisting of ongoing cognitive retraining per SLP. 3. Discussed tips to decrease voiding at night, including adjustment of times she's drinking water (ie more in the morning, less in the evening. ) 4. Asked the patient to continue working on social re-acclimation may be church related, leisure, vocational, physical exercise, whatever  5. Continue with zoloft daily.  6. Nuedexta per neurology  7.I will see the patient back PRN. 20 minutes of face to face patient care time was spent during this visit. All questions were encouraged and answered.

## 2013-12-19 NOTE — Patient Instructions (Signed)
CONTINUE TO WORK ON YOUR HOME EXERCISES FOR COGNITION  GET OUT INTO THE COMMUNITY AND EXPERIENCE LIFE  TRY TO "RATION" YOUR LIQUIDS (WHERE YOU DRINK MORE IN THE MORNING AND LESS IN THE AFTERNOON, AND LITTLE IN THE EVENING) TO ELIMINATE SOME OF THE TRIPS TO THE TOILET AT NIGHT!!!   PLEASE CALL ME WITH ANY PROBLEMS OR QUESTIONS [phone]).

## 2013-12-23 ENCOUNTER — Encounter: Payer: Self-pay | Admitting: Neurology

## 2014-01-09 ENCOUNTER — Encounter: Payer: Self-pay | Admitting: Neurology

## 2014-01-09 ENCOUNTER — Ambulatory Visit (INDEPENDENT_AMBULATORY_CARE_PROVIDER_SITE_OTHER): Payer: Commercial Managed Care - HMO | Admitting: Neurology

## 2014-01-09 VITALS — BP 120/70 | HR 70 | Resp 16 | Ht 67.0 in | Wt 128.6 lb

## 2014-01-09 DIAGNOSIS — G931 Anoxic brain damage, not elsewhere classified: Secondary | ICD-10-CM

## 2014-01-09 DIAGNOSIS — F482 Pseudobulbar affect: Secondary | ICD-10-CM

## 2014-01-09 DIAGNOSIS — F32A Depression, unspecified: Secondary | ICD-10-CM

## 2014-01-09 DIAGNOSIS — F329 Major depressive disorder, single episode, unspecified: Secondary | ICD-10-CM

## 2014-01-09 DIAGNOSIS — G3184 Mild cognitive impairment, so stated: Secondary | ICD-10-CM

## 2014-01-09 NOTE — Patient Instructions (Signed)
Your are doing so much better.  I think you can discontinue the Nuedexta since it really doesn't seem to help with the crying spells.  Continue the sertraline.  Follow up with Dr. Leonides CaveZelson.  I wouldn't drive until you discuss it with him.  He may recommend that you get formal driving evaluation.  Follow up with me in 3 months.

## 2014-01-09 NOTE — Progress Notes (Signed)
NEUROLOGY FOLLOW UP OFFICE NOTE  Amy Bates [id number]  HISTORY OF PRESENT ILLNESS: Amy Bates is a 68 year old right-handed woman with history of COPD, hypothyroidism, hyperlipidemia, depression, renal artery occlusion, metal in jaw, and abdominal aortic aneurysm who follows up for PRES and possible anoxic brain injury.  She is accompanied by her husband.    UPDATE: She has been doing well.  Progress is slower now, but she is improving. She was started on Neudexta.  She thinks it helps with the crying spells, but her husband doesn't notice a difference.  She has started driving again, with her husband at her side.  She has been doing well.  HISTORY: She was found to have a 5.4 x 4.8cm unruptured infrarenal abdominal aortic aneurysm after experiencing three weeks of abdominal pain.  She underwent endovascular repair and stenting on 01/11/13.   Following the procedure, she began feeling confused.  She initially began having word-finding difficulties where she would lose track of thought and would fumble for the words.  She understands others and is able to read without difficulty.  This was intermittent but becoming more frequent.  Whenever she has word-finding difficulties, her husband will start asking her questions to test her.  She presented to the ED on 03/15/13 because she was not able to tell her husband the year or her daughters' names.  CT Head revealed chronic small vessel ischemic changes, but no evidence of acute infarct, mass lesion or hemorrhage.  She was diagnosed with an upper respiratory infection.  She also began experiencing bi-frontal and retro-orbital headaches, described as pounding, about 6/10 (not worse headache of her life) and not associated with nausea, vomiting, photophobia, phonophobia, or visual disturbance.  She denies neck or shoulder pain.  It would occur about 3 days a week and would last a couple of hours.  Onset seems to occur later in the day.  It is not  positional.  She treats it with acetaminophen, which helps.  She denies vision problems.  She continued to have a deterioration of depressive symptoms.  She was placed on Abilify and her psychiatrist later started Wellbutrin.  She was admitted to [facility] on 05/27/13 for altered mental status.  She had a fall.  There was mentioning of possible seizure activity, described as convulsive movements with frothing at the mouth and cyanosis of the lips.  Labs on admission revealed Na 136, K 3.5, Cl 95, bicarb 24, glucose 122, BUN 13, Cr 1.10, Ca 9.6, TP 7.4, Alb 4.1, AST 24, ALT 11, AP 53, TB 0.5, WBC 11.5, Hgb 13.8, Hct 39.2, PLT 251, Leukocytosis was thought to be secondary to fall.  Otherwise, labs did not reveal sign of infection.  She was not hypertensive.  Initial CT of the head showed possible thin isodense subdural hematoma, but was thought to be artifact, as repeat imaging did not reveal this.  However, the repeat CT of the head revealed confluent generalized white matter hypodensities in the cerebrum, with reduced conspicuity of the sulci when compared to prior CT on 03/15/13, thought was mentioned to be associated with PRES.  EEG revealed generalized background slowing but no seizure or epileptiform activity.  Wellbutrin was discontinued.    She was subsequently hospitalized at [facility] on 06/08/13 for worsening mental status and aphasia.  With her history of metal in her jaw, they used a low-magnet MRI of the brain with and without contrast, which revealed diffuse cerebral edema with central mass effect on the lateral ventricles, sequela  of PRES versus encephalitis.  There was also evidence of acute lacunar infarct within the posterior left periventricular white matter.  She underwent long-term EEG monitoring which revealed generalized slowing but no seizures or epileptiform activity.  She underwent workup for infectious, paraneoplastic, autoimmune and demyelinating etiologies.  She underwent LP, which  revealed cell count 1, CMV PCR negative, EBV-PCR negative, VZV PCR negative, HSV PCR negative, gram stain and culture negative, cryptococal antigen negative, lactate 1.3, protein 87, glucose 63.  JCV antibody negative.  OCBs were noted in CSF and serum.  Other labs included NMDA receptor antibody negative, paraneoplastic panel negative.  B1 was 81.  ESR 20.  CRP 4.1.  Mg 1.8.  Phosphorus 3.5.  UA was positive.  CT of chest/abdomen/pelvis did not reveal cancer.  Blood pressure was managed, but she did not improve.  It was suspected that she may have an autoimmune encephalitis, so she was treated with Solumedrol for 3 days.  PLEX was considered but held because she improved on the steroids.    Ultimately, she exhibited cognitive deficits, as demonstrated by poor memory, insight, attention and concentration.  She was discharged to Rehab at [facility], where she showed much improvement.  Abilify was stopped.  She exhibited a short steppage gait with flexed posture, so Sinemet was initiated.  She was placed on Seroquel and Zoloft.  She developed marked improvement in cognitive deficits, however memory was still an issue.  Gait improved, so Sinemet was discontinued.  She was taking amitriptyline for headache, which has since been discontinued.  She had formal neuropsychological testing with Dr. Valentina Shaggy in August.  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  She exhibited borderline-range verbal comprehension and auditory working memory.  She exhibited impaired executive functioning.    She had a repeat MRI of the brain performed at [facility] on 11/16/13 (actually scans not available for review): 1. Substantial interval improvement in the previous confluent T2/FLAIR signal abnormality distributed throughout the bilateral cerebral white matter with posterior predominance, most likely reflecting resolving PRES (posterior reversible encephalopathy syndrome). There has been a concomitant  decrease in sulcal and ventricular effacement. 2. Persistent areas of periventricular and subcortical T2 hyperintensity likely reflect background of chronic microvascular ischemic white matter changes, although a component of residual PRES-related signal abnormality could have this appearance. 3. Innumerable foci of susceptibility artifact along the cortex and at the gray-white interface of the bilateral cerebrum are most consistent with microhemorrhages. Increased number of these foci suggests worsening from prior, although this could partially be related to differences in technique.  Medications include Zoloft for depression, Nuedexta for pseudobulbar affect and melatonin to improve better sleep pattern. She has a history of depression, but she has had significant increase in depression since the surgery, and pseudobulbar affect.   Family history is significant for father with Alzheimer's disease and aunt with cerebral aneurysm.  PAST MEDICAL HISTORY: Past Medical History  Diagnosis Date  . Arthritis   . Allergic rhinitis   . Asthma   . Depression   . GERD (gastroesophageal reflux disease)   . Hyperlipidemia   . Urinary incontinence   . History of chicken pox   . COPD (chronic obstructive pulmonary disease)   . Pneumonia   . Chest pain     [facility]  . Hypothyroid   . PRES (posterior reversible encephalopathy syndrome)   . Constipation   . Peripheral vascular disease   . Chronic kidney disease     MEDICATIONS: Current Outpatient Prescriptions on  File Prior to Visit  Medication Sig Dispense Refill  . atorvastatin (LIPITOR) 40 MG tablet Take 1 tablet (40 mg total) by mouth daily.  90 tablet  3  . fenofibrate 160 MG tablet Take 160 mg by mouth daily.      Marland Kitchen levothyroxine (SYNTHROID, LEVOTHROID) 75 MCG tablet TAKE 1 TABLET BY MOUTH EVERY DAY  30 tablet  6  . Melatonin 3 MG TABS Take 3 mg by mouth.      . pantoprazole (PROTONIX) 40 MG tablet TAKE 1 TABLET BY MOUTH ONCE DAILY   90 tablet  0  . polyethylene glycol (MIRALAX / GLYCOLAX) packet Take 17 g by mouth daily.       . sertraline (ZOLOFT) 50 MG tablet Take 1 tablet (50 mg total) by mouth daily.  30 tablet  0  . mometasone (ASMANEX) 220 MCG/INH inhaler Inhale 2 puffs into the lungs.      Marland Kitchen PROAIR HFA 108 (90 BASE) MCG/ACT inhaler INHALE 2 PUFFS BY MOUTH EVERY 6 HOURS AS NEEDED FOR WHEEZING  8.5 g  2  . tiotropium (SPIRIVA) 18 MCG inhalation capsule Place 18 mcg into inhaler and inhale as needed.        No current facility-administered medications on file prior to visit.    ALLERGIES: Allergies  Allergen Reactions  . Ibuprofen Other (See Comments)    Patient has a history of ulcers    FAMILY HISTORY: Family History  Problem Relation Age of Onset  . Coronary artery disease Mother   . Stroke Mother   . Heart disease Mother     before age 63  . Hypertension Neg Hx   . Diabetes Neg Hx   . Cancer Neg Hx   . Multiple sclerosis Sister     SOCIAL HISTORY: History   Social History  . Marital Status: Married    Spouse Name: N/A    Number of Children: 3  . Years of Education: N/A   Occupational History  . Retired Chief Operating Officer    Social History Main Topics  . Smoking status: Former Smoker    Start date: 01/11/2013  . Smokeless tobacco: Not on file  . Alcohol Use: No  . Drug Use: No  . Sexual Activity: No   Other Topics Concern  . Not on file   Social History Narrative   Lives with mother-in-law.    REVIEW OF SYSTEMS: Constitutional: No fevers, chills, or sweats, no generalized fatigue, change in appetite Eyes: No visual changes, double vision, eye pain Ear, nose and throat: No hearing loss, ear pain, nasal congestion, sore throat Cardiovascular: No chest pain, palpitations Respiratory:  No shortness of breath at rest or with exertion, wheezes GastrointestinaI: No nausea, vomiting, diarrhea, abdominal pain, fecal incontinence Genitourinary:  No dysuria, urinary retention or  frequency Musculoskeletal:  No neck pain, back pain Integumentary: No rash, pruritus, skin lesions Neurological: as above Psychiatric:  Pseudobulbar affect (easily cries) Endocrine: No palpitations, fatigue, diaphoresis, mood swings, change in appetite, change in weight, increased thirst Hematologic/Lymphatic:  No anemia, purpura, petechiae. Allergic/Immunologic: no itchy/runny eyes, nasal congestion, recent allergic reactions, rashes  PHYSICAL EXAM: Filed Vitals:   01/09/14 1320  BP: 120/70  Pulse: 70  Resp: 16   General: No acute distress Head:  Normocephalic/atraumatic Neck: supple, no paraspinal tenderness, full range of motion Heart:  Regular rate and rhythm Lungs:  Clear to auscultation bilaterally Back: No paraspinal tenderness Neurological Exam: Easily becomes tearful and cries.  Alert and oriented to person, place, and time. Attention  span and concentration intact, recent and remote memory intact, fund of knowledge intact.  Speech fluent and not dysarthric, sometimes with pauses due to word-finding, language intact. CN II-XII intact. Fundi not visualized. Bulk and tone normal, muscle strength 5/5 throughout. Sensation to light touch, temperature and vibration intact. Deep tendon reflexes 2+ throughout, toes downgoing. Finger to nose testing intact. Gait normal, Romberg negative.   IMPRESSION: Anoxic brain injury Mild cognitive impairment History of PRES Pseudobulbar affect Depression  PLAN: 1.  Continue Zoloft.  Since there has not been any significant difference in PSA, we will discontinue Neudexta. 2.  I would refrain from driving until discussion with Dr. Valentina Shaggy.  We may need to get formal OT driving evaluation. 3.  Follow up in 3 months.  Metta Clines, DO  CC:  Annye Asa, MD  Antionette Poles, Ph.D

## 2014-01-12 DIAGNOSIS — G3184 Mild cognitive impairment, so stated: Secondary | ICD-10-CM

## 2014-01-12 DIAGNOSIS — F4321 Adjustment disorder with depressed mood: Secondary | ICD-10-CM

## 2014-01-12 DIAGNOSIS — I6783 Posterior reversible encephalopathy syndrome: Secondary | ICD-10-CM

## 2014-01-12 DIAGNOSIS — F482 Pseudobulbar affect: Secondary | ICD-10-CM

## 2014-01-16 ENCOUNTER — Encounter: Payer: Self-pay | Admitting: Neurology

## 2014-01-17 ENCOUNTER — Other Ambulatory Visit: Payer: Self-pay | Admitting: *Deleted

## 2014-01-17 MED ORDER — SERTRALINE HCL 50 MG PO TABS: 50.0000 mg | ORAL_TABLET | Freq: Every day | ORAL | Status: DC

## 2014-01-17 MED ORDER — SERTRALINE HCL 50 MG PO TABS
50.0000 mg | ORAL_TABLET | Freq: Every day | ORAL | Status: DC
Start: 2014-01-17 — End: 2014-04-11

## 2014-01-31 ENCOUNTER — Ambulatory Visit (INDEPENDENT_AMBULATORY_CARE_PROVIDER_SITE_OTHER): Payer: Commercial Managed Care - HMO | Admitting: Family Medicine

## 2014-01-31 ENCOUNTER — Encounter: Payer: Self-pay | Admitting: Family Medicine

## 2014-01-31 VITALS — BP 120/72 | HR 80 | Temp 97.9°F | Resp 16 | Wt 127.4 lb

## 2014-01-31 DIAGNOSIS — F32A Depression, unspecified: Secondary | ICD-10-CM

## 2014-01-31 DIAGNOSIS — Z23 Encounter for immunization: Secondary | ICD-10-CM

## 2014-01-31 DIAGNOSIS — I6783 Posterior reversible encephalopathy syndrome: Secondary | ICD-10-CM

## 2014-01-31 DIAGNOSIS — Z1231 Encounter for screening mammogram for malignant neoplasm of breast: Secondary | ICD-10-CM

## 2014-01-31 DIAGNOSIS — E785 Hyperlipidemia, unspecified: Secondary | ICD-10-CM

## 2014-01-31 DIAGNOSIS — Z0000 Encounter for general adult medical examination without abnormal findings: Secondary | ICD-10-CM

## 2014-01-31 DIAGNOSIS — J449 Chronic obstructive pulmonary disease, unspecified: Secondary | ICD-10-CM

## 2014-01-31 DIAGNOSIS — F329 Major depressive disorder, single episode, unspecified: Secondary | ICD-10-CM

## 2014-01-31 DIAGNOSIS — E038 Other specified hypothyroidism: Secondary | ICD-10-CM

## 2014-01-31 DIAGNOSIS — Z780 Asymptomatic menopausal state: Secondary | ICD-10-CM

## 2014-01-31 LAB — CBC WITH DIFFERENTIAL/PLATELET
Basophils Absolute: 0 10*3/uL (ref 0.0–0.1)
Basophils Relative: 0.7 % (ref 0.0–3.0)
EOS ABS: 0.1 10*3/uL (ref 0.0–0.7)
Eosinophils Relative: 2 % (ref 0.0–5.0)
HCT: 39.2 % (ref 36.0–46.0)
Hemoglobin: 12.9 g/dL (ref 12.0–15.0)
LYMPHS PCT: 41.6 % (ref 12.0–46.0)
Lymphs Abs: 2.6 10*3/uL (ref 0.7–4.0)
MCHC: 33 g/dL (ref 30.0–36.0)
MCV: 89.3 fl (ref 78.0–100.0)
Monocytes Absolute: 0.4 10*3/uL (ref 0.1–1.0)
Monocytes Relative: 6.9 % (ref 3.0–12.0)
NEUTROS PCT: 48.8 % (ref 43.0–77.0)
Neutro Abs: 3 10*3/uL (ref 1.4–7.7)
Platelets: 269 10*3/uL (ref 150.0–400.0)
RBC: 4.39 Mil/uL (ref 3.87–5.11)
RDW: 13.8 % (ref 11.5–15.5)
WBC: 6.2 10*3/uL (ref 4.0–10.5)

## 2014-01-31 LAB — TSH: TSH: 1.59 u[IU]/mL (ref 0.35–4.50)

## 2014-01-31 NOTE — Patient Instructions (Signed)
Follow up in 6 months to recheck cholesterol We'll notify you of your lab results and make any changes if needed We'll call you with your mammo and bone density appt Keep up the good work!  You look great! Call with any questions or concerns Happy Holidays!!!

## 2014-01-31 NOTE — Progress Notes (Signed)
Pre visit review using our clinic review tool, if applicable. No additional management support is needed unless otherwise documented below in the visit note. 

## 2014-01-31 NOTE — Assessment & Plan Note (Signed)
Chronic problem.  Following w/ neuro Everlena Cooper([facility]) and neuropsych [facility]  Pt has resumed driving short distances w/ husband as passenger and is doing well.  Will continue to follow and assist as able.

## 2014-01-31 NOTE — Assessment & Plan Note (Signed)
Chronic problem.  Pt reports mood has improved but she continues to have frequent and uncontrolled crying.  Suspect this may be sequelae of PRES and she will continue to follow w/ neuro.  Continue Zoloft at this time.

## 2014-01-31 NOTE — Assessment & Plan Note (Signed)
Pt's PE WNL.  Due for colonoscopy- pt needs to wait until after husband has his.  Overdue on mammo and DEXA- orders entered today.  No need for pap.  Written screening schedule updated and given to pt.  Check labs.  Anticipatory guidance provided.

## 2014-01-31 NOTE — Assessment & Plan Note (Signed)
Chronic problem.  Pt denies current sxs.  Check labs.  Adjust meds prn

## 2014-01-31 NOTE — Progress Notes (Signed)
   Subjective:    Patient ID: Amy Bates, female    DOB: 02/17/1946, 68 y.o.   MRN: [id number]  HPI Here today for CPE.  Risk Factors: COPD- chronic problem, on Spiriva and Proair prn.  Not following w/ Pulmonology Depression- chronic problem, on Zoloft.  Pt continues to be highly emotional, sequela of PRES Hyperlipidemia- chronic problem, on Lipitor and fenofibrate Hypothyroid- chronic problem, on Synthroid PRES- chronic problem, following w/ Dr Everlena CooperJaffe.  Pt just started driving again w/ husband present in car (Dr Paula ComptonZelsyn ok'd this). Physical Activity: improving, pt in PT Fall Risk: moderate due to hx of encephalopathy Depression: See above Hearing: normal to conversational tones and whispered voice at 6 ft ADL's: independent Cognitive: normal linear thought process, some memory difficulties, attention intact Home Safety: safe at home Height, Weight, BMI, Visual Acuity: see vitals, vision corrected to 20/20 w/ glasses Counseling: due for colonoscopy- needs to defer until husband's health issues have resolved.  Due for mammo/DEXA Labs Ordered: See A&P Care Plan: See A&P    Review of Systems Patient reports no vision/hearing changes, anorexia, fever ,adenopathy, persistant/recurrent hoarseness, swallowing issues, chest pain, palpitations, edema, persistant/recurrent cough, hemoptysis, dyspnea (rest,exertional, paroxysmal nocturnal), gastrointestinal  bleeding (melena, rectal bleeding), abdominal pain, excessive heart burn, GU symptoms (dysuria, hematuria, voiding/incontinence issues) syncope, focal weakness, memory loss, numbness & tingling, skin/hair/nail changes, abnormal bruising/bleeding, musculoskeletal symptoms/signs.     Objective:   Physical Exam General Appearance:    Alert, cooperative, no distress, appears stated age  Head:    Normocephalic, without obvious abnormality, atraumatic  Eyes:    PERRL, conjunctiva/corneas clear, EOM's intact, fundi    benign, both eyes  Ears:     Normal TM's and external ear canals, both ears  Nose:   Nares normal, septum midline, mucosa normal, no drainage    or sinus tenderness  Throat:   Lips, mucosa, and tongue normal; teeth and gums normal  Neck:   Supple, symmetrical, trachea midline, no adenopathy;    Thyroid: no enlargement/tenderness/nodules  Back:     Symmetric, no curvature, ROM normal, no CVA tenderness  Lungs:     Clear to auscultation bilaterally, respirations unlabored  Chest Wall:    No tenderness or deformity   Heart:    Regular rate and rhythm, S1 and S2 normal, no murmur, rub   or gallop  Breast Exam:    Deferred to mammo  Abdomen:     Soft, non-tender, bowel sounds active all four quadrants,    no masses, no organomegaly  Genitalia:    Deferred at pt's request  Rectal:    Extremities:   Extremities normal, atraumatic, no cyanosis or edema  Pulses:   2+ and symmetric all extremities  Skin:   Skin color, texture, turgor normal, no rashes or lesions  Lymph nodes:   Cervical, supraclavicular, and axillary nodes normal  Neurologic:   CNII-XII intact, normal strength, sensation and reflexes    throughout         Assessment & Plan:

## 2014-01-31 NOTE — Assessment & Plan Note (Signed)
Chronic problem.  Tolerating meds w/o difficulty.  Check labs.  Adjust meds prn  

## 2014-01-31 NOTE — Assessment & Plan Note (Signed)
Chronic problem.  On Spiriva daily and using albuterol only as needed.  sxs are well controlled and pt denies wheezing or SOB.  Will continue to follow.

## 2014-02-01 ENCOUNTER — Other Ambulatory Visit: Payer: Self-pay | Admitting: Family Medicine

## 2014-02-01 DIAGNOSIS — E870 Hyperosmolality and hypernatremia: Secondary | ICD-10-CM

## 2014-02-01 LAB — BASIC METABOLIC PANEL
BUN: 19 mg/dL (ref 6–23)
CO2: 23 mEq/L (ref 19–32)
Calcium: 10.4 mg/dL (ref 8.4–10.5)
Chloride: 115 mEq/L — ABNORMAL HIGH (ref 96–112)
Creatinine, Ser: 1.3 mg/dL — ABNORMAL HIGH (ref 0.4–1.2)
GFR: 44.54 mL/min — ABNORMAL LOW (ref >60.00)
GLUCOSE: 92 mg/dL (ref 70–99)
POTASSIUM: 5.1 meq/L (ref 3.5–5.1)
SODIUM: 153 meq/L — AB (ref 135–145)

## 2014-02-01 LAB — HEPATIC FUNCTION PANEL
ALBUMIN: 4.6 g/dL (ref 3.5–5.2)
ALK PHOS: 50 U/L (ref 39–117)
ALT: 15 U/L (ref 0–35)
AST: 35 U/L (ref 0–37)
Bilirubin, Direct: 0.1 mg/dL (ref 0.0–0.3)
TOTAL PROTEIN: 7.7 g/dL (ref 6.0–8.3)
Total Bilirubin: 0.4 mg/dL (ref 0.2–1.2)

## 2014-02-01 LAB — LIPID PANEL
Cholesterol: 167 mg/dL (ref 0–200)
HDL: 51.8 mg/dL (ref >39.00)
LDL Cholesterol: 94 mg/dL (ref 0–99)
NonHDL: 115.2
TRIGLYCERIDES: 107 mg/dL (ref 0.0–149.0)
Total CHOL/HDL Ratio: 3
VLDL: 21.4 mg/dL (ref 0.0–40.0)

## 2014-02-07 ENCOUNTER — Other Ambulatory Visit (INDEPENDENT_AMBULATORY_CARE_PROVIDER_SITE_OTHER): Payer: Commercial Managed Care - HMO

## 2014-02-07 DIAGNOSIS — E870 Hyperosmolality and hypernatremia: Secondary | ICD-10-CM

## 2014-02-07 LAB — BASIC METABOLIC PANEL
BUN: 19 mg/dL (ref 6–23)
CHLORIDE: 103 meq/L (ref 96–112)
CO2: 28 mEq/L (ref 19–32)
CREATININE: 1.3 mg/dL — AB (ref 0.4–1.2)
Calcium: 9.2 mg/dL (ref 8.4–10.5)
GFR: 45.36 mL/min — ABNORMAL LOW (ref >60.00)
Glucose, Bld: 71 mg/dL (ref 70–99)
POTASSIUM: 4.1 meq/L (ref 3.5–5.1)
Sodium: 140 mEq/L (ref 135–145)

## 2014-02-20 ENCOUNTER — Ambulatory Visit (HOSPITAL_COMMUNITY)
Admission: RE | Admit: 2014-02-20 | Discharge: 2014-02-20 | Disposition: A | Discharge status: Home / Self Care | Payer: Commercial Managed Care - HMO | Source: Ambulatory Visit | Attending: Surgery | Admitting: Surgery

## 2014-02-20 ENCOUNTER — Ambulatory Visit (INDEPENDENT_AMBULATORY_CARE_PROVIDER_SITE_OTHER): Payer: Commercial Managed Care - HMO | Admitting: Surgery

## 2014-02-20 ENCOUNTER — Encounter: Payer: Self-pay | Admitting: Surgery

## 2014-02-20 VITALS — BP 150/63 | HR 51 | Ht 67.0 in | Wt 128.9 lb

## 2014-02-20 DIAGNOSIS — Z48812 Encounter for surgical aftercare following surgery on the circulatory system: Secondary | ICD-10-CM | POA: Diagnosis not present

## 2014-02-20 DIAGNOSIS — I714 Abdominal aortic aneurysm, without rupture, unspecified: Secondary | ICD-10-CM

## 2014-02-20 DIAGNOSIS — Z95828 Presence of other vascular implants and grafts: Secondary | ICD-10-CM

## 2014-02-20 NOTE — Addendum Note (Signed)
Addended by: Sharee PimpleMCCHESNEY, MARILYN K on: 02/20/2014 01:26 PM   Modules accepted: Orders

## 2014-02-20 NOTE — Progress Notes (Signed)
Patient name: Amy Bates MRN: [id number] DOB: 12/18/1945 Sex: female     Chief Complaint  Patient presents with  . Re-evaluation    6 month f/u     HISTORY OF PRESENT ILLNESS: The patient is here today for followup. She presented to the hospital on 01/11/2013 with a symptomatic juxtarenal abdominal aortic aneurysm. Her CT scan showed a 5.4 cm aneurysm with inflammatory changes. I felt this was the etiology of her back pain. Because of the inflammatory component I felt a percutaneous approach was more appropriate. This did require snorkel technique for the left renal artery. The patient's abdominal pain has now resolved. Postoperatively, she had issues with uncontrollable crying. This was initially thought to be depression, she has now been diagnosed with PRES and is being [facility].    She has no complaints today.  No abdominal pain  Past Medical History  Diagnosis Date  . Arthritis   . Allergic rhinitis   . Asthma   . Depression   . GERD (gastroesophageal reflux disease)   . Hyperlipidemia   . Urinary incontinence   . History of chicken pox   . COPD (chronic obstructive pulmonary disease)   . Pneumonia   . Chest pain     [facility]  . Hypothyroid   . PRES (posterior reversible encephalopathy syndrome)   . Constipation   . Peripheral vascular disease   . Chronic kidney disease     Past Surgical History  Procedure Laterality Date  . Tubal ligation    . Tonsillectomy    . Knee surgery      right  . Endovascular stent insertion N/A 01/11/2013    Procedure: ENDOVASCULAR STENT GRAFT INSERTION; ULTRASOUND GUIDED, Renal Stent left renal artery , open left brachial artery closure.;  Surgeon: Nada LibmanVance W Stacye Noori, MD;  Location: [facility] OR;  Service: Vascular;  Laterality: N/A;  . Abdominal aortic aneurysm repair    . Joint replacement      History   Social History  . Marital Status: Married    Spouse Name: N/A    Number of Children: 3  . Years of  Education: N/A   Occupational History  . Retired Leisure centre managerBartender    Social History Main Topics  . Smoking status: Former Smoker    Start date: 01/11/2013  . Smokeless tobacco: Not on file  . Alcohol Use: No  . Drug Use: No  . Sexual Activity: No   Other Topics Concern  . Not on file   Social History Narrative   Lives with Amy Bates.    Family History  Problem Relation Age of Onset  . Coronary artery disease Mother   . Stroke Mother   . Heart disease Mother     before age 67  . Hypertension Neg Hx   . Diabetes Neg Hx   . Cancer Neg Hx   . Multiple sclerosis Sister   . AAA (abdominal aortic aneurysm) Sister   . Heart disease Father     Allergies as of 02/20/2014 - Review Complete 02/20/2014  Allergen Reaction Noted  . Ibuprofen Other (See Comments) 05/26/2013    Current Outpatient Prescriptions on File Prior to Visit  Medication Sig Dispense Refill  . atorvastatin (LIPITOR) 40 MG tablet Take 1 tablet (40 mg total) by mouth daily. 90 tablet 3  . fenofibrate 160 MG tablet Take 160 mg by mouth daily.    Marland Kitchen. levothyroxine (SYNTHROID, LEVOTHROID) 75 MCG tablet TAKE 1 TABLET BY MOUTH EVERY DAY 30  tablet 6  . Melatonin 3 MG TABS Take 3 mg by mouth.    . mometasone (ASMANEX) 220 MCG/INH inhaler Inhale 2 puffs into the lungs.    . pantoprazole (PROTONIX) 40 MG tablet TAKE 1 TABLET BY MOUTH ONCE DAILY 90 tablet 0  . polyethylene glycol (MIRALAX / GLYCOLAX) packet Take 17 g by mouth daily.     Marland Kitchen. PROAIR HFA 108 (90 BASE) MCG/ACT inhaler INHALE 2 PUFFS BY MOUTH EVERY 6 HOURS AS NEEDED FOR WHEEZING 8.5 g 2  . sertraline (ZOLOFT) 50 MG tablet Take 1 tablet (50 mg total) by mouth daily. 30 tablet 3  . sertraline (ZOLOFT) 50 MG tablet Take 1 tablet (50 mg total) by mouth daily. 30 tablet 0  . tiotropium (SPIRIVA) 18 MCG inhalation capsule Place 18 mcg into inhaler and inhale as needed.      No current facility-administered medications on file prior to visit.     REVIEW OF  SYSTEMS: No changes to prior visit  PHYSICAL EXAMINATION:   Vital signs are BP 150/63 mmHg  Pulse 51  Ht 5\' 7"  (1.702 m)  Wt 128 lb 14.4 oz (58.469 kg)  BMI 20.18 kg/m2  SpO2 100% General: The patient appears their stated age. HEENT:  No gross abnormalities Pulmonary:  Non labored breathing Abdomen: Soft and non-tender Musculoskeletal: There are no major deformities. Neurologic: No focal weakness or paresthesias are detected, Skin: There are no ulcer or rashes noted. Psychiatric: The patient has normal affect. Cardiovascular: There is a regular rate and rhythm without significant murmur appreciated.   Diagnostic Studies U/S ordered and reviewed to day.  This showed a continued decrease in her AAA.  Max diameter now 3.8cm  Assessment: S/p emergent repair of AAA Plan: Patient remains stable with a continued decrease in her aneurysm without endoleak.  Plan for follow up in 1 year.  Jorge NyV. Wells Amy Bates, M.D. [facility] Office: [phone] Pager:  [phone]

## 2014-02-28 ENCOUNTER — Other Ambulatory Visit: Payer: Self-pay | Admitting: General Practice

## 2014-02-28 ENCOUNTER — Other Ambulatory Visit: Payer: Self-pay | Admitting: Family Medicine

## 2014-02-28 ENCOUNTER — Ambulatory Visit
Admission: RE | Admit: 2014-02-28 | Discharge: 2014-02-28 | Disposition: A | Discharge status: Home / Self Care | Payer: Commercial Managed Care - HMO | Source: Ambulatory Visit | Attending: Family Medicine | Admitting: Family Medicine

## 2014-02-28 DIAGNOSIS — Z780 Asymptomatic menopausal state: Secondary | ICD-10-CM

## 2014-02-28 DIAGNOSIS — Z1231 Encounter for screening mammogram for malignant neoplasm of breast: Secondary | ICD-10-CM

## 2014-02-28 MED ORDER — ALENDRONATE SODIUM 70 MG PO TABS
70.0000 mg | ORAL_TABLET | ORAL | Status: DC
Start: 2014-02-28 — End: 2014-02-28

## 2014-02-28 NOTE — Telephone Encounter (Signed)
#  12 is a 90 day supply, pt will take 4 a month.

## 2014-03-11 ENCOUNTER — Other Ambulatory Visit: Payer: Self-pay | Admitting: Family Medicine

## 2014-03-13 NOTE — Telephone Encounter (Signed)
Medication Detail      Disp Refills Start End     atorvastatin (LIPITOR) 40 MG tablet 90 tablet 3 01/31/2013     Sig - Route: Take 1 tablet (40 mg total) by mouth daily. - Oral    E-Prescribing Status: Receipt confirmed by pharmacy (01/31/2013 4:33 PM EST)    Last OV: 11.17.15 F/U: 6-Mths. Rx request to pharmacy/[facility]

## 2014-03-20 ENCOUNTER — Other Ambulatory Visit: Payer: Self-pay | Admitting: Family Medicine

## 2014-03-20 ENCOUNTER — Other Ambulatory Visit: Payer: Self-pay | Admitting: General Practice

## 2014-03-20 MED ORDER — LEVOTHYROXINE SODIUM 75 MCG PO TABS: 75.0000 ug | ORAL_TABLET | Freq: Every day | ORAL | Status: DC

## 2014-03-20 MED ORDER — FENOFIBRATE 160 MG PO TABS: 160.0000 mg | ORAL_TABLET | Freq: Every day | ORAL | Status: DC

## 2014-03-20 NOTE — Telephone Encounter (Signed)
Med filled.  

## 2014-04-11 ENCOUNTER — Encounter: Payer: Self-pay | Admitting: Neurology

## 2014-04-11 ENCOUNTER — Ambulatory Visit (INDEPENDENT_AMBULATORY_CARE_PROVIDER_SITE_OTHER): Payer: Commercial Managed Care - HMO | Admitting: Neurology

## 2014-04-11 VITALS — BP 140/60 | HR 58 | Temp 97.2°F | Resp 16 | Ht 67.0 in | Wt 130.2 lb

## 2014-04-11 DIAGNOSIS — F482 Pseudobulbar affect: Secondary | ICD-10-CM

## 2014-04-11 DIAGNOSIS — IMO0001 Reserved for inherently not codable concepts without codable children: Secondary | ICD-10-CM

## 2014-04-11 DIAGNOSIS — R51 Headache: Secondary | ICD-10-CM

## 2014-04-11 DIAGNOSIS — R030 Elevated blood-pressure reading, without diagnosis of hypertension: Secondary | ICD-10-CM | POA: Diagnosis not present

## 2014-04-11 DIAGNOSIS — G931 Anoxic brain damage, not elsewhere classified: Secondary | ICD-10-CM

## 2014-04-11 DIAGNOSIS — I6783 Posterior reversible encephalopathy syndrome: Secondary | ICD-10-CM | POA: Diagnosis not present

## 2014-04-11 DIAGNOSIS — R519 Headache, unspecified: Secondary | ICD-10-CM

## 2014-04-11 MED ORDER — SERTRALINE HCL 25 MG PO TABS: 75.0000 mg | ORAL_TABLET | Freq: Every day | ORAL | Status: DC

## 2014-04-11 NOTE — Patient Instructions (Signed)
1.  We will increase sertraline to 75mg  at bedtime (take three 25mg  tablets).  This will address the mood and headache 2.  Limit use of Tylenol to no more than 2 days out of the week 3.  Drive only with husband in car and during daylight hours 4.  Re[facility] and [facility] in 3 months.  Follow up with me afterwards.

## 2014-04-11 NOTE — Progress Notes (Signed)
NEUROLOGY FOLLOW UP OFFICE NOTE  Amy Bates [id number]  HISTORY OF PRESENT ILLNESS: Amy Bates is a 69 year old right-handed woman with history of COPD, hypothyroidism, hyperlipidemia, depression, renal artery occlusion, metal in jaw, and abdominal aortic aneurysm who follows up for PRES and possible anoxic brain injury.  She is accompanied by her husband.    UPDATE: She currently takes Zoloft 71m at bedtime.  Overall, she is improving.  Her thinking is more clear.  Dr. ZValentina Shaggysaid she could drive and only drives with her husband in the car.  She has no problems with orientation.  She still has pseudobulbar affect and easily cries, while the next moment she is in good spirits.  Over the past couple of months, she has been waking up with headaches.  It is pounding in the mid-frontal region.  It is not associated with dizziness, visual disturbance, focal numbness or weakness, nausea, photophobia or phonophobia.  She takes a Tylenol and it resolves in 30 to 60 minutes.  She cannot take NSAIDs due to ulcers.  She has been under a lot of stress lately in relation to financial problems with the house, her husband's health and her pet's health.  Blood pressure has continued to be mildly elevated.  HISTORY: She was found to have a 5.4 x 4.8cm unruptured infrarenal abdominal aortic aneurysm after experiencing three weeks of abdominal pain.  She underwent endovascular repair and stenting on 01/11/13.   Following the procedure, she began feeling confused.  She initially began having word-finding difficulties where she would lose track of thought and would fumble for the words.  She understands others and is able to read without difficulty.  This was intermittent but becoming more frequent.  Whenever she has word-finding difficulties, her husband will start asking her questions to test her.  She presented to the ED on 03/15/13 because she was not able to tell her husband the year or her daughters' names.   CT Head revealed chronic small vessel ischemic changes, but no evidence of acute infarct, mass lesion or hemorrhage.  She was diagnosed with an upper respiratory infection.  She also began experiencing bi-frontal and retro-orbital headaches, described as pounding, about 6/10 (not worse headache of her life) and not associated with nausea, vomiting, photophobia, phonophobia, or visual disturbance.  She denies neck or shoulder pain.  It would occur about 3 days a week and would last a couple of hours.  Onset seems to occur later in the day.  It is not positional.  She treats it with acetaminophen, which helps.  She denies vision problems.  She continued to have a deterioration of depressive symptoms.  She was placed on Abilify and her psychiatrist later started Wellbutrin.  [facility].  Initial CT of the head showed possible thin isodense subdural hematoma, but was thought to be artifact, as repeat imaging did not reveal this.  However, the repeat CT of the head revealed confluent generalized white matter hypodensities in the cerebrum, with reduced conspicuity of the sulci when compared to prior CT on 03/15/13, thought was mentioned to be associated with PRES.  EEG revealed generalized background slowing but no seizure or epileptiform activity.  Wellbutrin was discontinued.    [facility] status and aphasia.  With her history of metal in her jaw, they used a low-magnet MRI of the brain with and without contrast, which revealed diffuse cerebral  edema with central mass effect on the lateral ventricles, sequela of PRES versus encephalitis.  There was also evidence of acute lacunar infarct within the posterior left periventricular white matter.  She underwent long-term EEG monitoring which revealed generalized slowing but no seizures or epileptiform activity.  She underwent workup for infectious,  paraneoplastic, autoimmune and demyelinating etiologies.  She underwent LP, which revealed cell count 1, CMV PCR negative, EBV-PCR negative, VZV PCR negative, HSV PCR negative, gram stain and culture negative, cryptococal antigen negative, lactate 1.3, protein 87, glucose 63.  JCV antibody negative.  OCBs were noted in CSF and serum.  Other labs included NMDA receptor antibody negative, paraneoplastic panel negative.  B1 was 81.  ESR 20.  CRP 4.1.  Mg 1.8.  Phosphorus 3.5.  UA was positive.  CT of chest/abdomen/pelvis did not reveal cancer.  Blood pressure was managed, but she did not improve.  It was suspected that she may have an autoimmune encephalitis, so she was treated with Solumedrol for 3 days.  PLEX was considered but held because she improved on the steroids.    Ultimately, she exhibited cognitive deficits, as demonstrated by poor memory, insight, attention and concentration.  She was discharged to Rehab at [facility], where she showed much improvement.  Abilify was stopped.  She exhibited a short steppage gait with flexed posture, so Sinemet was initiated.  She was placed on Seroquel and Zoloft.  She developed marked improvement in cognitive deficits, however memory was still an issue.  Gait improved, so Sinemet was discontinued.  She was taking amitriptyline for headache, which has since been discontinued.  She was started on Neudexta for pseudobulbar affect, and was subsequently discontinued as it seemed ineffective.  She had formal neuropsychological testing with Dr. Valentina Shaggy in August.  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  She exhibited borderline-range verbal comprehension and auditory working memory.  She exhibited impaired executive functioning.    She had a repeat MRI of the brain performed at [facility] on 11/16/13 (actually scans not available for review): 1. Substantial interval improvement in the previous confluent T2/FLAIR signal abnormality  distributed throughout the bilateral cerebral white matter with posterior predominance, most likely reflecting resolving PRES (posterior reversible encephalopathy syndrome). There has been a concomitant decrease in sulcal and ventricular effacement. 2. Persistent areas of periventricular and subcortical T2 hyperintensity likely reflect background of chronic microvascular ischemic white matter changes, although a component of residual PRES-related signal abnormality could have this appearance. 3. Innumerable foci of susceptibility artifact along the cortex and at the gray-white interface of the bilateral cerebrum are most consistent with microhemorrhages. Increased number of these foci suggests worsening from prior, although this could partially be related to differences in technique.  Medications include Zoloft for depression, Nuedexta for pseudobulbar affect and melatonin to improve better sleep pattern. She has a history of depression, but she has had significant increase in depression since the surgery, and pseudobulbar affect.   Family history is significant for father with Alzheimer's disease and aunt with cerebral aneurysm.  PAST MEDICAL HISTORY: Past Medical History  Diagnosis Date  . Arthritis   . Allergic rhinitis   . Asthma   . Depression   . GERD (gastroesophageal reflux disease)   . Hyperlipidemia   . Urinary incontinence   . History of chicken pox   . COPD (chronic obstructive pulmonary disease)   . Pneumonia   . Chest pain     [facility]  . Hypothyroid   . PRES (posterior reversible  encephalopathy syndrome)   . Constipation   . Peripheral vascular disease   . Chronic kidney disease     MEDICATIONS: Current Outpatient Prescriptions on File Prior to Visit  Medication Sig Dispense Refill  . alendronate (FOSAMAX) 70 MG tablet TAKE 1 TABLET BY MOUTH WITH A FULL GLASS OF WATER ON AN EMPTY STOMACH EVERY 7 DAYS. 12 tablet 3  . atorvastatin (LIPITOR) 40 MG tablet TAKE 1  TABLET BY MOUTH EVERY DAY 90 tablet 1  . fenofibrate 160 MG tablet TAKE 1 TABLET BY MOUTH DAILY 90 tablet 1  . levothyroxine (SYNTHROID, LEVOTHROID) 75 MCG tablet TAKE 1 TABLET BY MOUTH DAILY 90 tablet 1  . Melatonin 3 MG TABS Take 3 mg by mouth.    . pantoprazole (PROTONIX) 40 MG tablet TAKE 1 TABLET BY MOUTH ONCE DAILY 90 tablet 0  . polyethylene glycol (MIRALAX / GLYCOLAX) packet Take 17 g by mouth daily.     Marland Kitchen PROAIR HFA 108 (90 BASE) MCG/ACT inhaler INHALE 2 PUFFS BY MOUTH EVERY 6 HOURS AS NEEDED FOR WHEEZING 8.5 g 2  . tiotropium (SPIRIVA) 18 MCG inhalation capsule Place 18 mcg into inhaler and inhale as needed.     . mometasone (ASMANEX) 220 MCG/INH inhaler Inhale 2 puffs into the lungs.     No current facility-administered medications on file prior to visit.    ALLERGIES: Allergies  Allergen Reactions  . Ibuprofen Other (See Comments)    Patient has a history of ulcers    FAMILY HISTORY: Family History  Problem Relation Age of Onset  . Coronary artery disease Mother   . Stroke Mother   . Heart disease Mother     before age 73  . Hypertension Neg Hx   . Diabetes Neg Hx   . Cancer Neg Hx   . Multiple sclerosis Sister   . AAA (abdominal aortic aneurysm) Sister   . Heart disease Father     SOCIAL HISTORY: History   Social History  . Marital Status: Married    Spouse Name: N/A    Number of Children: 3  . Years of Education: N/A   Occupational History  . Retired Chief Operating Officer    Social History Main Topics  . Smoking status: Former Smoker    Start date: 01/11/2013  . Smokeless tobacco: Not on file  . Alcohol Use: No  . Drug Use: No  . Sexual Activity: No   Other Topics Concern  . Not on file   Social History Narrative   Lives with mother-in-law.    REVIEW OF SYSTEMS: Constitutional: No fevers, chills, or sweats, no generalized fatigue, change in appetite Eyes: No visual changes, double vision, eye pain Ear, nose and throat: No hearing loss, ear pain,  nasal congestion, sore throat Cardiovascular: No chest pain, palpitations Respiratory:  No shortness of breath at rest or with exertion, wheezes GastrointestinaI: No nausea, vomiting, diarrhea, abdominal pain, fecal incontinence Genitourinary:  No dysuria, urinary retention or frequency Musculoskeletal:  No neck pain, back pain Integumentary: No rash, pruritus, skin lesions Neurological: as above Psychiatric: pseudobulbar affect Endocrine: No palpitations, fatigue, diaphoresis, mood swings, change in appetite, change in weight, increased thirst Hematologic/Lymphatic:  No anemia, purpura, petechiae. Allergic/Immunologic: no itchy/runny eyes, nasal congestion, recent allergic reactions, rashes  PHYSICAL EXAM: Filed Vitals:   04/11/14 1104  BP: 140/60  Pulse: 58  Temp: 97.2 F (36.2 C)  Resp: 16   General: No acute distress Head:  Normocephalic/atraumatic Eyes:  Fundoscopic exam unremarkable without vessel changes, exudates, hemorrhages or  papilledema. Neck: supple, no paraspinal tenderness, full range of motion Heart:  Regular rate and rhythm Lungs:  Clear to auscultation bilaterally Back: No paraspinal tenderness Neurological Exam: Easily becomes tearful and cries.  Alert and oriented to person, place, and time. Attention span and concentration intact.  Recalled 2 of 3 words after 5 minutes.  Remote memory intact, fund of knowledge intact.  Speech fluent and not dysarthric, language intact. CN II-XII intact. Fundi not visualized. Bulk and tone normal, muscle strength 5/5 throughout. Deep tendon reflexes 2+ throughout, toes downgoing. Finger to nose testing intact. Gait normal, able to walk in tandem.  Romberg negative.  IMPRESSION: Chronic daily headache Anoxic brain injury Mild cognitive impairment History of PRES Pseudobulbar affect  PLAN: 1.  Will increase zoloft to 26m at bedtime to address mood and headaches 2.  Limit use of Tylenol to no more than 2 days out of the week.   Other option would be a muscle relaxant such as Flexeril. 3.  Repeat MRI of brain with and without contrast in 3 months, with follow up with me soon after. 4.  Optimize blood pressure control.  30 minutes spent with patient, over 50% spent discussing management.  AMetta Clines DO  CC:  KAnnye Asa MD

## 2014-04-11 NOTE — Addendum Note (Signed)
Addended by: Glean SalenVAN DER GLAS, Josceline Chenard E on: 04/11/2014 04:22 PM   Modules accepted: Orders

## 2014-04-19 ENCOUNTER — Other Ambulatory Visit: Payer: Self-pay | Admitting: Family Medicine

## 2014-04-19 NOTE — Telephone Encounter (Signed)
Med filled.  

## 2014-04-21 ENCOUNTER — Telehealth: Payer: Self-pay | Admitting: Family Medicine

## 2014-04-21 NOTE — Telephone Encounter (Signed)
Caller name: Amy Bates, Amy Bates Relation to pt: self  Call back number: [phone] [facility]  [id number] [place] [address] [facility] [address] [phone] (Phone) [phone] (Fax)     Reason for call:  Pt would like to know why pantoprazole (PROTONIX) 40 MG tablet was not approved by MD

## 2014-04-21 NOTE — Telephone Encounter (Signed)
Pt notified that med had been filled on 04/19/14 #90 with 0

## 2014-04-25 NOTE — Telephone Encounter (Signed)
error 

## 2014-05-30 ENCOUNTER — Telehealth: Payer: Self-pay | Admitting: Neurology

## 2014-05-30 NOTE — Telephone Encounter (Signed)
1.  She should be taking 75mg  of sertraline.  She can take 50mg  at bedtime for 3 days, then 25mg  at bedtime for 3 days, then stop. 2.  Once she stops the sertraline, she may start nortriptyline 10mg  at bedtime.  She can contact us if 4 weeks with update.

## 2014-05-30 NOTE — Telephone Encounter (Signed)
Pt called wanting to speak to a nurse regarding her script for SERTRALINE 25mg . Pt does not feel that it is helping, if anything she feels that it is making her feer worse. C/b [phone]

## 2014-05-30 NOTE — Telephone Encounter (Signed)
Patient still having really bad headaches in the morning is the anything else she can take.

## 2014-05-31 NOTE — Telephone Encounter (Signed)
Patient feels this is from allergies and would like try sinus OTC medication  Before she stops the Zoloft she will call back in a week and give a update on how she is doing

## 2014-07-13 ENCOUNTER — Other Ambulatory Visit: Payer: Self-pay | Admitting: *Deleted

## 2014-07-13 MED ORDER — SERTRALINE HCL 25 MG PO TABS: 75.0000 mg | ORAL_TABLET | Freq: Every day | ORAL | Status: DC

## 2014-07-13 NOTE — Telephone Encounter (Signed)
Patient requesting a refill 

## 2014-07-19 ENCOUNTER — Other Ambulatory Visit: Payer: Self-pay | Admitting: Family Medicine

## 2014-07-19 NOTE — Telephone Encounter (Signed)
Med filled.  

## 2014-08-02 ENCOUNTER — Encounter: Payer: Self-pay | Admitting: Family Medicine

## 2014-08-02 ENCOUNTER — Ambulatory Visit (INDEPENDENT_AMBULATORY_CARE_PROVIDER_SITE_OTHER): Payer: Commercial Managed Care - HMO | Admitting: Family Medicine

## 2014-08-02 VITALS — BP 142/78 | HR 78 | Temp 98.0°F | Resp 16 | Wt 141.0 lb

## 2014-08-02 DIAGNOSIS — F329 Major depressive disorder, single episode, unspecified: Secondary | ICD-10-CM | POA: Diagnosis not present

## 2014-08-02 DIAGNOSIS — R030 Elevated blood-pressure reading, without diagnosis of hypertension: Secondary | ICD-10-CM | POA: Diagnosis not present

## 2014-08-02 DIAGNOSIS — F32A Depression, unspecified: Secondary | ICD-10-CM

## 2014-08-02 DIAGNOSIS — K069 Disorder of gingiva and edentulous alveolar ridge, unspecified: Secondary | ICD-10-CM

## 2014-08-02 DIAGNOSIS — K056 Periodontal disease, unspecified: Secondary | ICD-10-CM

## 2014-08-02 DIAGNOSIS — K088 Other specified disorders of teeth and supporting structures: Secondary | ICD-10-CM | POA: Diagnosis not present

## 2014-08-02 DIAGNOSIS — IMO0001 Reserved for inherently not codable concepts without codable children: Secondary | ICD-10-CM

## 2014-08-02 DIAGNOSIS — E785 Hyperlipidemia, unspecified: Secondary | ICD-10-CM | POA: Diagnosis not present

## 2014-08-02 LAB — HEPATIC FUNCTION PANEL
ALBUMIN: 4.2 g/dL (ref 3.5–5.2)
ALT: 14 U/L (ref 0–35)
AST: 30 U/L (ref 0–37)
Alkaline Phosphatase: 39 U/L (ref 39–117)
BILIRUBIN DIRECT: 0.1 mg/dL (ref 0.0–0.3)
BILIRUBIN TOTAL: 0.5 mg/dL (ref 0.2–1.2)
Total Protein: 7.1 g/dL (ref 6.0–8.3)

## 2014-08-02 LAB — CBC WITH DIFFERENTIAL/PLATELET
Basophils Absolute: 0 10*3/uL (ref 0.0–0.1)
Basophils Relative: 0.7 % (ref 0.0–3.0)
EOS PCT: 3.2 % (ref 0.0–5.0)
Eosinophils Absolute: 0.2 10*3/uL (ref 0.0–0.7)
HCT: 38.9 % (ref 36.0–46.0)
Hemoglobin: 13.2 g/dL (ref 12.0–15.0)
LYMPHS ABS: 2 10*3/uL (ref 0.7–4.0)
Lymphocytes Relative: 33.4 % (ref 12.0–46.0)
MCHC: 34 g/dL (ref 30.0–36.0)
MCV: 88.7 fl (ref 78.0–100.0)
MONO ABS: 0.5 10*3/uL (ref 0.1–1.0)
Monocytes Relative: 7.8 % (ref 3.0–12.0)
NEUTROS ABS: 3.3 10*3/uL (ref 1.4–7.7)
Neutrophils Relative %: 54.9 % (ref 43.0–77.0)
PLATELETS: 264 10*3/uL (ref 150.0–400.0)
RBC: 4.38 Mil/uL (ref 3.87–5.11)
RDW: 13.9 % (ref 11.5–15.5)
WBC: 6 10*3/uL (ref 4.0–10.5)

## 2014-08-02 LAB — BASIC METABOLIC PANEL
BUN: 21 mg/dL (ref 6–23)
CALCIUM: 9.7 mg/dL (ref 8.4–10.5)
CHLORIDE: 103 meq/L (ref 96–112)
CO2: 31 mEq/L (ref 19–32)
Creatinine, Ser: 1.17 mg/dL (ref 0.40–1.20)
GFR: 48.89 mL/min — ABNORMAL LOW (ref >60.00)
Glucose, Bld: 82 mg/dL (ref 70–99)
Potassium: 4.8 mEq/L (ref 3.5–5.1)
Sodium: 138 mEq/L (ref 135–145)

## 2014-08-02 LAB — LIPID PANEL
Cholesterol: 155 mg/dL (ref 0–200)
HDL: 56.4 mg/dL (ref >39.00)
LDL CALC: 84 mg/dL (ref 0–99)
NonHDL: 98.6
Total CHOL/HDL Ratio: 3
Triglycerides: 75 mg/dL (ref 0.0–149.0)
VLDL: 15 mg/dL (ref 0.0–40.0)

## 2014-08-02 MED ORDER — FENOFIBRATE 160 MG PO TABS: 160.0000 mg | ORAL_TABLET | Freq: Every day | ORAL | Status: DC

## 2014-08-02 MED ORDER — ATORVASTATIN CALCIUM 40 MG PO TABS: 40.0000 mg | ORAL_TABLET | Freq: Every day | ORAL | Status: DC

## 2014-08-02 MED ORDER — LEVOTHYROXINE SODIUM 75 MCG PO TABS: 75.0000 ug | ORAL_TABLET | Freq: Every day | ORAL | Status: DC

## 2014-08-02 NOTE — Assessment & Plan Note (Signed)
New to provider.  Extensive.  Pt is fearful of losing her teeth.  Refer to periodontist for complete evaluation and tx.

## 2014-08-02 NOTE — Assessment & Plan Note (Signed)
Chronic problem, tolerating statin and fenofibrate w/o difficulty.  Check labs.  Adjust meds prn  

## 2014-08-02 NOTE — Assessment & Plan Note (Addendum)
Chronic problem.  Difficult to assess effectiveness of medication due to pt's brain injury and pseudobulbar affect.  Stressed the need w/ both pt and husband for her to take medication as directed.  Pt expressed understanding and is in agreement w/ plan.

## 2014-08-02 NOTE — Progress Notes (Signed)
Pre visit review using our clinic review tool, if applicable. No additional management support is needed unless otherwise documented below in the visit note. 

## 2014-08-02 NOTE — Patient Instructions (Signed)
Schedule your complete physical in 6 months We'll notify you of your lab results and make any changes if needed We'll call you with your periodontist appt Make sure you are taking the Sertraline 3 tabs daily Call with any questions or concerns HAPPY BELATED BIRTHDAY!

## 2014-08-02 NOTE — Progress Notes (Signed)
   Subjective:    Patient ID: Amy Bates, female    DOB: 12/12/1945, 69 y.o.   MRN: [id number]  HPI Elevated BP- ongoing issue for pt.  She is tearful in office today, very agitated which is likely elevating BP.  Denies CP, SOB, HAs, visual changes, edema.  Hyperlipidemia- chronic problem, on Lipitor and fenofibrate.  Denies abd pain, N/V, myalgias.  Depression- chronic problem for pt.  Husband reports she has not been taking Sertraline correctly.  Pt is tearful, very stressed, 'i have so many problems'.  Pt is now worried that she has periodontal disease and would need surgery.   Review of Systems For ROS see HPI     Objective:   Physical Exam  Constitutional: She is oriented to person, place, and time. She appears well-developed and well-nourished. No distress.  HENT:  Head: Normocephalic and atraumatic.  Extensive periodontal disease  Eyes: Conjunctivae and EOM are normal. Pupils are equal, round, and reactive to light.  Neck: Normal range of motion. Neck supple. No thyromegaly present.  Cardiovascular: Normal rate, regular rhythm, normal heart sounds and intact distal pulses.   No murmur heard. Pulmonary/Chest: Effort normal and breath sounds normal. No respiratory distress.  Abdominal: Soft. She exhibits no distension. There is no tenderness.  Musculoskeletal: She exhibits no edema.  Lymphadenopathy:    She has no cervical adenopathy.  Neurological: She is alert and oriented to person, place, and time.  Skin: Skin is warm and dry.  Psychiatric:  Tearful, cries easily over seemingly nothing  Vitals reviewed.         Assessment & Plan:

## 2014-08-02 NOTE — Assessment & Plan Note (Signed)
Pt's BP is again mildly elevated but she is tearful and obviously upset in office.  Asymptomatic.  Will hold off on medication at this time but continue to monitor closely.

## 2014-10-25 DIAGNOSIS — F482 Pseudobulbar affect: Secondary | ICD-10-CM | POA: Diagnosis not present

## 2014-10-25 DIAGNOSIS — I6783 Posterior reversible encephalopathy syndrome: Secondary | ICD-10-CM | POA: Diagnosis not present

## 2014-10-25 DIAGNOSIS — R51 Headache: Secondary | ICD-10-CM | POA: Diagnosis not present

## 2014-10-25 DIAGNOSIS — G931 Anoxic brain damage, not elsewhere classified: Secondary | ICD-10-CM | POA: Diagnosis not present

## 2014-11-01 ENCOUNTER — Encounter: Payer: Self-pay | Admitting: Neurology

## 2014-11-06 ENCOUNTER — Telehealth: Payer: Self-pay | Admitting: Family Medicine

## 2014-11-06 DIAGNOSIS — I6783 Posterior reversible encephalopathy syndrome: Secondary | ICD-10-CM

## 2014-11-06 NOTE — Telephone Encounter (Signed)
This referral was placed.  

## 2014-11-06 NOTE — Telephone Encounter (Signed)
Felicia with [facility], Dr. Shon Millet  Pt has appt tomorrow 11/07/14 10:45am and is needing referral for [facility]. Please f/u as old referral appears expired.

## 2014-11-07 ENCOUNTER — Encounter: Payer: Self-pay | Admitting: Neurology

## 2014-11-07 ENCOUNTER — Ambulatory Visit (INDEPENDENT_AMBULATORY_CARE_PROVIDER_SITE_OTHER): Payer: Commercial Managed Care - HMO | Admitting: Neurology

## 2014-11-07 VITALS — BP 130/70 | HR 72 | Resp 18 | Ht 67.0 in | Wt 147.8 lb

## 2014-11-07 DIAGNOSIS — R51 Headache: Secondary | ICD-10-CM | POA: Diagnosis not present

## 2014-11-07 DIAGNOSIS — R519 Headache, unspecified: Secondary | ICD-10-CM

## 2014-11-07 DIAGNOSIS — F32A Depression, unspecified: Secondary | ICD-10-CM

## 2014-11-07 DIAGNOSIS — G931 Anoxic brain damage, not elsewhere classified: Secondary | ICD-10-CM | POA: Diagnosis not present

## 2014-11-07 DIAGNOSIS — I6783 Posterior reversible encephalopathy syndrome: Secondary | ICD-10-CM | POA: Diagnosis not present

## 2014-11-07 DIAGNOSIS — F329 Major depressive disorder, single episode, unspecified: Secondary | ICD-10-CM | POA: Diagnosis not present

## 2014-11-07 NOTE — Telephone Encounter (Signed)
Firefighter # [id number]

## 2014-11-07 NOTE — Progress Notes (Signed)
NEUROLOGY FOLLOW UP OFFICE NOTE  Amy Bates [id number]  HISTORY OF PRESENT ILLNESS: Amy Bates is a 69 year old right-handed woman with history of COPD, hypothyroidism, hyperlipidemia, depression, renal artery occlusion, metal in jaw, and abdominal aortic aneurysm who follows up for PRES and anoxic brain injury.  She is accompanied by her husband.    UPDATE: She continued on the sertraline 81m daily.  She did not switch to nortriptyline.  She is still depressed and irritable.  She easily is irritated with her husband.  She exhibits pseudobulbar affect.  Her memory sometimes seems worse some days than others.  Headaches are improved.  She had frequent headaches for a couple of weeks a few weeks ago, but they have resolved.  She had a repeat MRI of the brain earlier this month.  It was stable.  HISTORY: She was found to have a 5.4 x 4.8cm unruptured infrarenal abdominal aortic aneurysm after experiencing three weeks of abdominal pain.  She underwent endovascular repair and stenting on 01/11/13.   Following the procedure, she began feeling confused.  She initially began having word-finding difficulties where she would lose track of thought and would fumble for the words.  She understands others and is able to read without difficulty.  This was intermittent but becoming more frequent.  Whenever she has word-finding difficulties, her husband will start asking her questions to test her.  She presented to the ED on 03/15/13 because she was not able to tell her husband the year or her daughters' names.  CT Head revealed chronic small vessel ischemic changes, but no evidence of acute infarct, mass lesion or hemorrhage.  She was diagnosed with an upper respiratory infection.  She also began experiencing bi-frontal and retro-orbital headaches, described as pounding, about 6/10 (not worse headache of her life) and not associated with nausea, vomiting, photophobia, phonophobia, or visual disturbance.   She denies neck or shoulder pain.  It would occur about 3 days a week and would last a couple of hours.  Onset seems to occur later in the day.  It is not positional.  She denies vision problems.  Sertraline was ineffective.  She continued to have a deterioration of depressive symptoms.  She was placed on Abilify and her psychiatrist later started Wellbutrin.  [facility].  Initial CT of the head showed possible thin isodense subdural hematoma, but was thought to be artifact, as repeat imaging did not reveal this.  However, the repeat CT of the head revealed confluent generalized white matter hypodensities in the cerebrum, with reduced conspicuity of the sulci when compared to prior CT on 03/15/13, thought was mentioned to be associated with PRES.  EEG revealed generalized background slowing but no seizure or epileptiform activity.  Wellbutrin was discontinued.    [facility] worsening [facility] status and aphasia.  With her history of metal in her jaw, they used a low-magnet MRI of the brain with and without contrast, which revealed diffuse cerebral edema with central mass effect on the lateral ventricles, sequela of PRES versus encephalitis.  There was also evidence of acute lacunar infarct within the posterior left periventricular white matter.  She underwent long-term EEG monitoring which revealed generalized slowing but no seizures or epileptiform activity.  She underwent workup for infectious, paraneoplastic, autoimmune and demyelinating etiologies.  She underwent LP, which revealed cell count 1, CMV PCR negative, EBV-PCR negative, VZV PCR negative, HSV  PCR negative, gram stain and culture negative, cryptococal antigen negative, lactate 1.3, protein 87, glucose 63.  JCV antibody negative.  OCBs were noted in CSF and serum.  Other labs included NMDA receptor antibody negative, paraneoplastic panel negative.   B1 was 81.  ESR 20.  CRP 4.1.  Mg 1.8.  Phosphorus 3.5.  UA was positive.  CT of chest/abdomen/pelvis did not reveal cancer.  Blood pressure was managed, but she did not improve.  It was suspected that she may have an autoimmune encephalitis, so she was treated with Solumedrol for 3 days.  PLEX was considered but held because she improved on the steroids.    Ultimately, she exhibited cognitive deficits, as demonstrated by poor memory, insight, attention and concentration.  She was discharged to Rehab at [facility], where she showed much improvement.  Abilify was stopped.  She exhibited a short steppage gait with flexed posture, so Sinemet was initiated.  She was placed on Seroquel and Zoloft.  She developed marked improvement in cognitive deficits, however memory was still an issue.  Gait improved, so Sinemet was discontinued.  She was taking amitriptyline for headache, which has since been discontinued.  She was started on Neudexta for pseudobulbar affect, and was subsequently discontinued as it seemed ineffective.  She had formal neuropsychological testing with Dr. Valentina Shaggy in August.  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  She exhibited borderline-range verbal comprehension and auditory working memory.  She exhibited impaired executive functioning.  Nuedexta was ineffective.  She had a repeat MRI of the brain performed at [facility] on 11/16/13 (actually scans not available for review): 1. Substantial interval improvement in the previous confluent T2/FLAIR signal abnormality distributed throughout the bilateral cerebral white matter with posterior predominance, most likely reflecting resolving PRES (posterior reversible encephalopathy syndrome). There has been a concomitant decrease in sulcal and ventricular effacement. 2. Persistent areas of periventricular and subcortical T2 hyperintensity likely reflect background of chronic microvascular ischemic white matter changes,  although a component of residual PRES-related signal abnormality could have this appearance. 3. Innumerable foci of susceptibility artifact along the cortex and at the gray-white interface of the bilateral cerebrum are most consistent with microhemorrhages. Increased number of these foci suggests worsening from prior, although this could partially be related to differences in technique.   Family history is significant for father with Alzheimer's disease and aunt with cerebral aneurysm.  PAST MEDICAL HISTORY: Past Medical History  Diagnosis Date  . Arthritis   . Allergic rhinitis   . Asthma   . Depression   . GERD (gastroesophageal reflux disease)   . Hyperlipidemia   . Urinary incontinence   . History of chicken pox   . COPD (chronic obstructive pulmonary disease)   . Pneumonia   . Chest pain     [facility]  . Hypothyroid   . PRES (posterior reversible encephalopathy syndrome)   . Constipation   . Peripheral vascular disease   . Chronic kidney disease     MEDICATIONS: Current Outpatient Prescriptions on File Prior to Visit  Medication Sig Dispense Refill  . alendronate (FOSAMAX) 70 MG tablet TAKE 1 TABLET BY MOUTH WITH A FULL GLASS OF WATER ON AN EMPTY STOMACH EVERY 7 DAYS. 12 tablet 3  . atorvastatin (LIPITOR) 40 MG tablet Take 1 tablet (40 mg total) by mouth daily. 90 tablet 1  . calcium-vitamin D (OSCAL WITH D) 250-125 MG-UNIT per tablet Take 1 tablet by mouth daily.    . fenofibrate 160 MG tablet Take 1  tablet (160 mg total) by mouth daily. 90 tablet 1  . levothyroxine (SYNTHROID, LEVOTHROID) 75 MCG tablet Take 1 tablet (75 mcg total) by mouth daily. 90 tablet 1  . loratadine (CLARITIN) 10 MG tablet Take 10 mg by mouth daily.    . mometasone (ASMANEX) 220 MCG/INH inhaler Inhale 2 puffs into the lungs.    . polyethylene glycol (MIRALAX / GLYCOLAX) packet Take 17 g by mouth daily.     Marland Kitchen PROAIR HFA 108 (90 BASE) MCG/ACT inhaler INHALE 2 PUFFS BY MOUTH EVERY 6 HOURS AS  NEEDED FOR WHEEZING 8.5 g 2  . sertraline (ZOLOFT) 25 MG tablet Take 3 tablets (75 mg total) by mouth at bedtime. 90 tablet 3  . pantoprazole (PROTONIX) 40 MG tablet TAKE 1 TABLET BY MOUTH EVERY DAY (Patient not taking: Reported on 11/07/2014) 90 tablet 1  . tiotropium (SPIRIVA) 18 MCG inhalation capsule Place 18 mcg into inhaler and inhale as needed.      No current facility-administered medications on file prior to visit.    ALLERGIES: Allergies  Allergen Reactions  . Ibuprofen Other (See Comments)    Patient has a history of ulcers    FAMILY HISTORY: Family History  Problem Relation Age of Onset  . Coronary artery disease Mother   . Stroke Mother   . Heart disease Mother     before age 94  . Hypertension Neg Hx   . Diabetes Neg Hx   . Cancer Neg Hx   . Multiple sclerosis Sister   . AAA (abdominal aortic aneurysm) Sister   . Heart disease Father     SOCIAL HISTORY: Social History   Social History  . Marital Status: Married    Spouse Name: N/A  . Number of Children: 3  . Years of Education: N/A   Occupational History  . Retired Chief Operating Officer    Social History Main Topics  . Smoking status: Former Smoker    Start date: 01/11/2013  . Smokeless tobacco: Not on file  . Alcohol Use: No  . Drug Use: No  . Sexual Activity: No   Other Topics Concern  . Not on file   Social History Narrative   Lives with mother-in-law.    REVIEW OF SYSTEMS: Constitutional: No fevers, chills, or sweats, no generalized fatigue, change in appetite Eyes: No visual changes, double vision, eye pain Ear, nose and throat: No hearing loss, ear pain, nasal congestion, sore throat Cardiovascular: No chest pain, palpitations Respiratory:  No shortness of breath at rest or with exertion, wheezes GastrointestinaI: No nausea, vomiting, diarrhea, abdominal pain, fecal incontinence Genitourinary:  No dysuria, urinary retention or frequency Musculoskeletal:  No neck pain, back pain Integumentary:  No rash, pruritus, skin lesions Neurological: as above Psychiatric: depression, mood swings Endocrine: No palpitations, fatigue, diaphoresis Hematologic/Lymphatic:  No anemia, purpura, petechiae. Allergic/Immunologic: no itchy/runny eyes, nasal congestion, recent allergic reactions, rashes  PHYSICAL EXAM: Filed Vitals:   11/07/14 1031  BP: 130/70  Pulse: 72  Resp: 18   General: No acute distress.  Patient appears well-groomed.   Head:  Normocephalic/atraumatic Eyes:  Fundoscopic exam unremarkable without vessel changes, exudates, hemorrhages or papilledema. Neck: supple, no paraspinal tenderness, full range of motion Heart:  Regular rate and rhythm Lungs:  Clear to auscultation bilaterally Back: No paraspinal tenderness Neurological Exam: easily cries, alert and oriented to person, place, and time. Attention span and concentration intact, delayed recall 1 of 3 words, remote memory intact, fund of knowledge intact.  Speech fluent and not dysarthric, Some trouble  with naming (pen), able to repeat and follow complex commands.   MMSE - Mini Mental State Exam 11/07/2014  Orientation to time 4  Orientation to Place 5  Registration 3  Attention/ Calculation 5  Recall 1  Language- name 2 objects 1  Language- repeat 1  Language- follow 3 step command 3  Language- read & follow direction 1  Write a sentence 1  Copy design 1  Total score 26   CN II-XII intact. Fundoscopic exam unremarkable without vessel changes, exudates, hemorrhages or papilledema.  Bulk and tone normal, muscle strength 5/5 throughout.  Sensation to light touch, temperature and vibration intact.  Deep tendon reflexes 2+ throughout, toes downgoing.  Finger to nose and heel to shin testing intact.  Gait normal, Romberg negative.  IMPRESSION: History of PRES Anoxic brain injury Mild cognitive impairment Headaches Pseudobulbar affect Depression  PLAN: Continue sertraline 64m daily for now.  Will refer back to  psychiatry to address her depression and mood changes. Follow up in 6 months.  30 minutes spent face to face with patient, over 50% spent discussing diagnosis and management.  AMetta Clines DO  CC:  KAnnye Asa MD

## 2014-11-07 NOTE — Patient Instructions (Signed)
1.  Continue the sertraline  daily for now.  Follow up with Dr. Valeda Malm 2.  Follow up in 6 months.

## 2014-11-28 ENCOUNTER — Other Ambulatory Visit: Payer: Self-pay

## 2014-11-28 ENCOUNTER — Other Ambulatory Visit: Payer: Self-pay | Admitting: General Practice

## 2014-11-28 MED ORDER — FENOFIBRATE 160 MG PO TABS
160.0000 mg | ORAL_TABLET | Freq: Every day | ORAL | Status: DC
Start: 2014-11-28 — End: 2014-11-30

## 2014-11-28 MED ORDER — LEVOTHYROXINE SODIUM 75 MCG PO TABS
75.0000 ug | ORAL_TABLET | Freq: Every day | ORAL | Status: DC
Start: 2014-11-28 — End: 2014-11-30

## 2014-11-28 MED ORDER — PANTOPRAZOLE SODIUM 40 MG PO TBEC: 40.0000 mg | DELAYED_RELEASE_TABLET | Freq: Every day | ORAL | Status: DC

## 2014-11-28 MED ORDER — ALENDRONATE SODIUM 70 MG PO TABS: ORAL_TABLET | ORAL | Status: DC

## 2014-11-28 MED ORDER — SERTRALINE HCL 25 MG PO TABS: 75.0000 mg | ORAL_TABLET | Freq: Every day | ORAL | Status: DC

## 2014-11-28 MED ORDER — ATORVASTATIN CALCIUM 40 MG PO TABS: 40.0000 mg | ORAL_TABLET | Freq: Every day | ORAL | Status: DC

## 2014-11-28 NOTE — Telephone Encounter (Signed)
Received Rx request from [facility].

## 2014-11-29 ENCOUNTER — Other Ambulatory Visit: Payer: Self-pay

## 2014-11-30 ENCOUNTER — Other Ambulatory Visit: Payer: Self-pay | Admitting: General Practice

## 2014-11-30 MED ORDER — ALENDRONATE SODIUM 70 MG PO TABS: ORAL_TABLET | ORAL | Status: DC

## 2014-11-30 MED ORDER — PANTOPRAZOLE SODIUM 40 MG PO TBEC: 40.0000 mg | DELAYED_RELEASE_TABLET | Freq: Every day | ORAL | Status: DC

## 2014-11-30 MED ORDER — LEVOTHYROXINE SODIUM 75 MCG PO TABS: 75.0000 ug | ORAL_TABLET | Freq: Every day | ORAL | Status: DC

## 2014-11-30 MED ORDER — FENOFIBRATE 160 MG PO TABS: 160.0000 mg | ORAL_TABLET | Freq: Every day | ORAL | Status: DC

## 2014-11-30 MED ORDER — ATORVASTATIN CALCIUM 40 MG PO TABS: 40.0000 mg | ORAL_TABLET | Freq: Every day | ORAL | Status: DC

## 2014-12-20 ENCOUNTER — Ambulatory Visit: Payer: Commercial Managed Care - HMO | Admitting: Neurology

## 2014-12-22 ENCOUNTER — Telehealth: Payer: Self-pay | Admitting: Neurology

## 2014-12-22 DIAGNOSIS — F329 Major depressive disorder, single episode, unspecified: Secondary | ICD-10-CM

## 2014-12-22 DIAGNOSIS — F32A Depression, unspecified: Secondary | ICD-10-CM

## 2014-12-22 NOTE — Telephone Encounter (Signed)
Dr Everlena Cooper, here is another one! Patient was suppose to have had referral to psyc back in Aug for depression .Amy Bates... Who do you want me to send her to? Like [facility]  Dr Dellia Cloud ??

## 2014-12-22 NOTE — Telephone Encounter (Signed)
Called to refer patient, No Answer  Left message for them to return my call before 4:30

## 2014-12-22 NOTE — Telephone Encounter (Signed)
Pt/ Amy Bates/DOB: February 21, 2046 //called for info about a Psyc. Appt. That was to be scheduled some time ago//call back @ [phone]

## 2014-12-22 NOTE — Telephone Encounter (Signed)
Dr Margarette Asal office returned call and the patient has to scheduled the appointment personally and the office requires $150 up front to schedule the appointment

## 2014-12-22 NOTE — Telephone Encounter (Signed)
Dr. Archer Asa

## 2014-12-22 NOTE — Telephone Encounter (Signed)
Called patient and informed that she will have to contact Dr Donell Beers to schedule herself and pay a $150 scheduling fee.Marland Kitchen She will contact their office to schedule

## 2014-12-25 ENCOUNTER — Telehealth: Payer: Self-pay | Admitting: Neurology

## 2014-12-25 NOTE — Telephone Encounter (Signed)
Pt/ called for info for a Psychological visit/ call back @ [phone]

## 2014-12-25 NOTE — Telephone Encounter (Signed)
Patient called back that we needed to call and set up the appointment because they can not get through only can get a recording. I explained to patient that a $150 scheduling fee had to be paid before Dr Margarette Asal office would make the appointment

## 2015-01-05 DIAGNOSIS — F321 Major depressive disorder, single episode, moderate: Secondary | ICD-10-CM | POA: Diagnosis not present

## 2015-01-22 ENCOUNTER — Other Ambulatory Visit: Payer: Self-pay

## 2015-01-22 DIAGNOSIS — Z1231 Encounter for screening mammogram for malignant neoplasm of breast: Secondary | ICD-10-CM

## 2015-02-01 ENCOUNTER — Telehealth: Payer: Self-pay | Admitting: Family Medicine

## 2015-02-01 DIAGNOSIS — I714 Abdominal aortic aneurysm, without rupture, unspecified: Secondary | ICD-10-CM

## 2015-02-01 NOTE — Telephone Encounter (Signed)
Pt calling for referral to Heart and Vascular Dr. Claybon JabsNickel. She has follow up appt scheduled for 02/26/15 [facility]

## 2015-02-01 NOTE — Telephone Encounter (Signed)
This referral was placed today.  

## 2015-02-06 ENCOUNTER — Encounter: Payer: Commercial Managed Care - HMO | Admitting: Family Medicine

## 2015-02-20 ENCOUNTER — Encounter: Payer: Self-pay | Admitting: Family

## 2015-02-21 DIAGNOSIS — F321 Major depressive disorder, single episode, moderate: Secondary | ICD-10-CM | POA: Diagnosis not present

## 2015-02-26 ENCOUNTER — Ambulatory Visit (HOSPITAL_COMMUNITY)
Admission: RE | Admit: 2015-02-26 | Discharge: 2015-02-26 | Disposition: A | Discharge status: Home / Self Care | Payer: Commercial Managed Care - HMO | Source: Ambulatory Visit | Attending: Surgery | Admitting: Surgery

## 2015-02-26 ENCOUNTER — Encounter: Payer: Self-pay | Admitting: Family

## 2015-02-26 ENCOUNTER — Other Ambulatory Visit: Payer: Self-pay | Admitting: Surgery

## 2015-02-26 ENCOUNTER — Ambulatory Visit (INDEPENDENT_AMBULATORY_CARE_PROVIDER_SITE_OTHER): Payer: Commercial Managed Care - HMO | Admitting: Family

## 2015-02-26 VITALS — BP 137/71 | HR 55 | Temp 97.1°F | Resp 14 | Ht 67.0 in | Wt 144.0 lb

## 2015-02-26 DIAGNOSIS — I714 Abdominal aortic aneurysm, without rupture, unspecified: Secondary | ICD-10-CM

## 2015-02-26 DIAGNOSIS — Z4889 Encounter for other specified surgical aftercare: Secondary | ICD-10-CM

## 2015-02-26 DIAGNOSIS — Z95828 Presence of other vascular implants and grafts: Secondary | ICD-10-CM | POA: Diagnosis not present

## 2015-02-26 DIAGNOSIS — Z8679 Personal history of other diseases of the circulatory system: Secondary | ICD-10-CM

## 2015-02-26 DIAGNOSIS — Z87891 Personal history of nicotine dependence: Secondary | ICD-10-CM | POA: Diagnosis not present

## 2015-02-26 DIAGNOSIS — Z48812 Encounter for surgical aftercare following surgery on the circulatory system: Secondary | ICD-10-CM

## 2015-02-26 DIAGNOSIS — E785 Hyperlipidemia, unspecified: Secondary | ICD-10-CM | POA: Diagnosis not present

## 2015-02-26 NOTE — Addendum Note (Signed)
Addended by: Adria DillELDRIDGE-LEWIS, Braylinn Gulden L on: 02/26/2015 03:13 PM   Modules accepted: Orders

## 2015-02-26 NOTE — Progress Notes (Signed)
[facility] [place]  Established [facility]  History of Present Illness  Amy Bates is a 69 y.o. ([birth year]) female patient of Dr. Myra Gianotti who is here today for followup. She presented to the hospital on 01/11/2013 with a symptomatic juxtarenal abdominal aortic aneurysm. Her CT scan showed a 5.4 cm aneurysm with inflammatory changes. Dr. Myra Gianotti felt this was the etiology of her back pain. Because of the inflammatory component a percutaneous approach was more appropriate. This did require snorkel technique for the left renal artery. The patient's abdominal pain resolved. Postoperatively, she had issues with uncontrollable crying. This was initially thought to be depression, she has now been diagnosed with PRES and is being treated at [facility]. She sees Dr. Donell Beers, psychiatrist in [place], re the PRES which husband states has improved. He states she still has short term memory issues and searches for words. She has been in rehab.   She takes a statin. She does not take any anticoagulants or antiplatelet medications.  Her husband is with her and states his wife has a brain injury and is speaking for her.   He is worried about local anesthesia with upcoming dental care and how she will react in the background of PRES.  Pt and her husband denies that pt has any hx of stroke or TIA.    Pt Diabetic: No Pt smoker: former smoker, quit in October 2014   Past Medical History  Diagnosis Date  . Arthritis   . Allergic rhinitis   . Asthma   . Depression   . GERD (gastroesophageal reflux disease)   . Hyperlipidemia   . Urinary incontinence   . History of chicken pox   . COPD (chronic obstructive pulmonary disease)   . Pneumonia   . Chest pain     [facility]  . Hypothyroid   . PRES (posterior reversible encephalopathy syndrome)   . Constipation   . Peripheral vascular disease   . Chronic kidney disease    Past Surgical History  Procedure Laterality Date   . Tubal ligation    . Tonsillectomy    . Knee surgery      right  . Endovascular stent insertion N/A 01/11/2013    Procedure: ENDOVASCULAR STENT GRAFT INSERTION; ULTRASOUND GUIDED, Renal Stent left renal artery , open left brachial artery closure.;  Surgeon: Nada Libman, MD;  Location: [facility] OR;  Service: Vascular;  Laterality: N/A;  . Abdominal aortic aneurysm repair    . Joint replacement     Social History Social History  Substance Use Topics  . Smoking status: Former Smoker    Start date: 01/11/2013  . Smokeless tobacco: Not on file  . Alcohol Use: No   Family History Family History  Problem Relation Age of Onset  . Coronary artery disease Mother   . Stroke Mother   . Heart disease Mother     before age 27  . Hypertension Neg Hx   . Diabetes Neg Hx   . Cancer Neg Hx   . Multiple sclerosis Sister   . AAA (abdominal aortic aneurysm) Sister   . Heart disease Father    Current Outpatient Prescriptions on File Prior to Visit  Medication Sig Dispense Refill  . alendronate (FOSAMAX) 70 MG tablet TAKE 1 TABLET BY MOUTH WITH A FULL GLASS OF WATER ON AN EMPTY STOMACH EVERY 7 DAYS. 12 tablet 3  . atorvastatin (LIPITOR) 40 MG tablet Take 1 tablet (40 mg total) by mouth daily. 90 tablet 1  .  calcium-vitamin D (OSCAL WITH D) 250-125 MG-UNIT per tablet Take 1 tablet by mouth daily.    . fenofibrate 160 MG tablet Take 1 tablet (160 mg total) by mouth daily. 90 tablet 1  . levothyroxine (SYNTHROID, LEVOTHROID) 75 MCG tablet Take 1 tablet (75 mcg total) by mouth daily. 90 tablet 1  . loratadine (CLARITIN) 10 MG tablet Take 10 mg by mouth daily.    . mometasone (ASMANEX) 220 MCG/INH inhaler Inhale 2 puffs into the lungs.    . pantoprazole (PROTONIX) 40 MG tablet Take 1 tablet (40 mg total) by mouth daily. 90 tablet 1  . polyethylene glycol (MIRALAX / GLYCOLAX) packet Take 17 g by mouth daily.     Marland Kitchen. PROAIR HFA 108 (90 BASE) MCG/ACT inhaler INHALE 2 PUFFS BY MOUTH EVERY 6 HOURS AS NEEDED  FOR WHEEZING 8.5 g 2  . sertraline (ZOLOFT) 25 MG tablet Take 3 tablets (75 mg total) by mouth at bedtime. 270 tablet 1  . tiotropium (SPIRIVA) 18 MCG inhalation capsule Place 18 mcg into inhaler and inhale as needed.      No current facility-administered medications on file prior to visit.   Allergies  Allergen Reactions  . Ibuprofen Other (See Comments)    Patient has a history of ulcers     ROS: See HPI for pertinent positives and negatives.  Physical Examination  Filed Vitals:   02/26/15 0906  BP: 137/71  Pulse: 55  Temp: 97.1 F (36.2 C)  TempSrc: Oral  Resp: 14  Height: 5\' 7"  (1.702 m)  Weight: 144 lb (65.318 kg)  SpO2: 98%   Body mass index is 22.55 kg/(m^2).  General: A&O x 3, WD  Pulmonary: Sym exp, good air movt, CTAB, no rales, rhonchi, or wheezing.   Cardiac: RRR, Nl S1, S2, no murmur appreciated  Vascular: Vessel Right Left  Radial 1+ Palpable 1+ Palpable  Carotid  without bruit  without bruit  Aorta Not palpable N/A  Femoral 2+Palpable 2+Palpable  Popliteal Not palpable Not palpable  PT Not Palpable Not Palpable  DP 2+ Palpable 2+ Palpable   Gastrointestinal: soft, NTND, -G/R, - HSM, - palpable masses, - CVAT B.  Musculoskeletal: M/S 4/5 throughout, extremities without ischemic changes.  Neurologic: Pain and light touch intact in extremities, Motor exam as listed above    CTA Abd/Pelvis Duplex (Date: 02/07/13) 1. Successful endovascular aortic repair of inflammatory juxtarenal abdominal aortic aneurysm without evidence of endoleak. The excluded aneurysm sac has slightly decreased in size currently measuring 5.1 x 4.4 cm compared to 5.4 x 4.8 cm. 2. Interval occlusion of the left renal artery stent graft with developing global renal cortical atrophy, and CT evidence of delayed renal function. 3. Persistent but improving inflammatory rind about the excluded aneurysm sac. Current maximal thickness of the enhancing rind is approximately 6 mm  compared to 8-9 mm previously. 4. Mild beading of the anterior division of the right renal artery suggests underlying fibromuscular dysplasia.    Non-Invasive Vascular Imaging  EVAR Duplex (Date: 02/26/2015) ABDOMINAL AORTA DUPLEX EVALUATION - POST ENDOVASCULAR REPAIR    INDICATION: Abdominal aortic aneurysm ; patency of Endovascular aortic repair     PREVIOUS INTERVENTION(S): Endovascular aortic repair with right renal stent 01/11/2013    DUPLEX EXAM:      DIAMETER AP (cm) DIAMETER TRANSVERSE (cm) VELOCITIES (cm/sec)  Aorta 2.47 3.07 62  Right Common Iliac 1.27 1.35 57  Left Common Iliac 1.28 1.30 49    Comparison Study       Date DIAMETER  AP (cm) DIAMETER TRANSVERSE (cm)  02/20/2014 3.37 3.80     ADDITIONAL FINDINGS:     IMPRESSION: Abdominal aortic sac present measuring 2.47cm x 3.07cm, no extraluminal flow present.     Compared to the previous exam:  Decreased abdominal aortic sac since previous study on 02/20/2014.       Medical Decision Making  Talicia ILIYANA CONVEY is a 69 y.o. female who presents s/p EVAR (Date: 01/11/13).  Pt is asymptomatic with a decrease in sac size compared to the previous year.  I discussed with the patient the importance of surveillance of the endograft.  The next endograft duplex will be scheduled for 12 months.  The patient will follow up with [place] in 12 months with these studies.  I emphasized the importance of maximal medical management including strict control of blood pressure, blood glucose, and lipid levels, antiplatelet agents, obtaining regular exercise, and cessation of smoking.   Thank you for allowing [place] to participate in this patient's care.  Charisse March, RN, MSN, FNP-C [facility] [place] Office: [phone]  Clinic Physician: Myra Gianotti  02/26/2015, 8:42 AM     VASCULAR QUALITY INITIATIVE FOLLOW UP DATA:  Current smoker: [  ] yes  Arly.Keller  ] no  Living status: [ X ]  Home  [  ] Nursing home   [  ] Homeless    MEDS:  ASA [  ] yes  Arly.Keller  ] no- [ X ] medical reason  [  ] non compliant  STATIN  Arly.Keller  ] yes  [  ] no- [  ] medical reason  [  ] non compliant  Beta blocker [  ] yes  Arly.Keller  ] no- Arly.Keller  ] medical reason  [  ] non compliant  ACE inhibitor [  ] yes  Arly.Keller  ] no- Arly.Keller  ] medical reason  [  ] non compliant  P2Y12 Antagonist [ X ] none  [  ] clopidogrel-Plavix  [  ] ticlopidine-Ticlid   [  ] prasugrel-Effient  [  ] ticagrelor- Brilinta    Anticoagulant Arly.Keller  ] None  [  ] warfarin  [  ] rivaroxaban-Xarelto [  ] dabigatran- Pradaxa  Current Max AAA = 3.07 cm  Endoleak:  Arly.Keller  ] no  [  ] yes- [  ] type I  [  ] type II  [  ] type III  [  ] indeterminate  Number of new interventions: 0 -   Date:      Why:  [  ] endoleak  [  ] limb occlusion   [  ] growth  [  ]  Migration   [  ] symtomatic/ rupture    Conversion to open repair: [  ] yes   [ X ] no   Date:   Other operation related to EVAR:  [  ] yes   Arly.Keller  ] no

## 2015-03-05 ENCOUNTER — Ambulatory Visit
Admission: RE | Admit: 2015-03-05 | Discharge: 2015-03-05 | Disposition: A | Discharge status: Home / Self Care | Payer: Commercial Managed Care - HMO | Source: Ambulatory Visit

## 2015-03-05 DIAGNOSIS — Z1231 Encounter for screening mammogram for malignant neoplasm of breast: Secondary | ICD-10-CM

## 2015-04-10 ENCOUNTER — Telehealth: Payer: Self-pay | Admitting: Family Medicine

## 2015-04-11 ENCOUNTER — Telehealth: Payer: Self-pay | Admitting: Behavioral Health

## 2015-04-11 ENCOUNTER — Encounter: Payer: Self-pay | Admitting: Behavioral Health

## 2015-04-11 NOTE — Telephone Encounter (Signed)
Unable to reach patient at time of Pre-Visit Call.  Left message for patient to return call when available.    

## 2015-04-11 NOTE — Addendum Note (Signed)
Addended by: Melanee Spry on: 04/11/2015 02:28 PM   Modules accepted: Medications

## 2015-04-11 NOTE — Telephone Encounter (Signed)
Pre-Visit Call completed with patient and chart updated.   Pre-Visit Info documented in Specialty Comments under [employer].    

## 2015-04-11 NOTE — Telephone Encounter (Signed)
error:[id number] ° °

## 2015-04-11 NOTE — Addendum Note (Signed)
Addended by: Harold Barban E on: 04/11/2015 12:31 PM   Modules accepted: Orders, Medications

## 2015-04-12 ENCOUNTER — Encounter: Payer: Self-pay | Admitting: Family Medicine

## 2015-04-12 ENCOUNTER — Ambulatory Visit (INDEPENDENT_AMBULATORY_CARE_PROVIDER_SITE_OTHER): Payer: Commercial Managed Care - HMO | Admitting: Family Medicine

## 2015-04-12 ENCOUNTER — Telehealth: Payer: Self-pay

## 2015-04-12 VITALS — BP 114/72 | HR 54 | Temp 97.9°F | Ht 66.0 in | Wt 142.2 lb

## 2015-04-12 DIAGNOSIS — E785 Hyperlipidemia, unspecified: Secondary | ICD-10-CM

## 2015-04-12 DIAGNOSIS — E039 Hypothyroidism, unspecified: Secondary | ICD-10-CM | POA: Diagnosis not present

## 2015-04-12 DIAGNOSIS — I6783 Posterior reversible encephalopathy syndrome: Secondary | ICD-10-CM

## 2015-04-12 DIAGNOSIS — Z0000 Encounter for general adult medical examination without abnormal findings: Secondary | ICD-10-CM | POA: Diagnosis not present

## 2015-04-12 DIAGNOSIS — Z1159 Encounter for screening for other viral diseases: Secondary | ICD-10-CM

## 2015-04-12 DIAGNOSIS — M810 Age-related osteoporosis without current pathological fracture: Secondary | ICD-10-CM

## 2015-04-12 LAB — COMPREHENSIVE METABOLIC PANEL
ALK PHOS: 30 U/L — AB (ref 39–117)
ALT: 18 U/L (ref 0–35)
AST: 35 U/L (ref 0–37)
Albumin: 4.1 g/dL (ref 3.5–5.2)
BILIRUBIN TOTAL: 0.6 mg/dL (ref 0.2–1.2)
BUN: 21 mg/dL (ref 6–23)
CALCIUM: 9.2 mg/dL (ref 8.4–10.5)
CO2: 29 meq/L (ref 19–32)
CREATININE: 1.19 mg/dL (ref 0.40–1.20)
Chloride: 104 mEq/L (ref 96–112)
GFR: 47.84 mL/min — AB (ref >60.00)
Glucose, Bld: 83 mg/dL (ref 70–99)
Potassium: 4.3 mEq/L (ref 3.5–5.1)
Sodium: 139 mEq/L (ref 135–145)
TOTAL PROTEIN: 6.8 g/dL (ref 6.0–8.3)

## 2015-04-12 LAB — LIPID PANEL
CHOL/HDL RATIO: 3
Cholesterol: 123 mg/dL (ref 0–200)
HDL: 43.7 mg/dL (ref >39.00)
LDL Cholesterol: 62 mg/dL (ref 0–99)
NONHDL: 79.08
TRIGLYCERIDES: 85 mg/dL (ref 0.0–149.0)
VLDL: 17 mg/dL (ref 0.0–40.0)

## 2015-04-12 LAB — CBC WITH DIFFERENTIAL/PLATELET
BASOS ABS: 0.1 10*3/uL (ref 0.0–0.1)
Basophils Relative: 1 % (ref 0.0–3.0)
EOS ABS: 0.2 10*3/uL (ref 0.0–0.7)
Eosinophils Relative: 3 % (ref 0.0–5.0)
HEMATOCRIT: 39.9 % (ref 36.0–46.0)
HEMOGLOBIN: 13.3 g/dL (ref 12.0–15.0)
LYMPHS PCT: 37.8 % (ref 12.0–46.0)
Lymphs Abs: 2.2 10*3/uL (ref 0.7–4.0)
MCHC: 33.3 g/dL (ref 30.0–36.0)
MCV: 89.9 fl (ref 78.0–100.0)
MONOS PCT: 8.3 % (ref 3.0–12.0)
Monocytes Absolute: 0.5 10*3/uL (ref 0.1–1.0)
Neutro Abs: 2.9 10*3/uL (ref 1.4–7.7)
Neutrophils Relative %: 49.9 % (ref 43.0–77.0)
PLATELETS: 251 10*3/uL (ref 150.0–400.0)
RBC: 4.43 Mil/uL (ref 3.87–5.11)
RDW: 14.3 % (ref 11.5–15.5)
WBC: 5.9 10*3/uL (ref 4.0–10.5)

## 2015-04-12 LAB — MICROALBUMIN / CREATININE URINE RATIO
CREATININE, U: 292.4 mg/dL
Microalb Creat Ratio: 0.4 mg/g (ref 0.0–30.0)
Microalb, Ur: 1.1 mg/dL (ref 0.0–1.9)

## 2015-04-12 LAB — TSH: TSH: 2.43 u[IU]/mL (ref 0.35–4.50)

## 2015-04-12 MED ORDER — ALENDRONATE SODIUM 70 MG PO TABS
ORAL_TABLET | ORAL | Status: DC
Start: 2015-04-12 — End: 2016-04-03

## 2015-04-12 NOTE — Telephone Encounter (Signed)
-----   Message from Lelon Perla, DO sent at 04/12/2015 11:28 AM EST ----- Pt would like to do cologuard

## 2015-04-12 NOTE — Progress Notes (Signed)
Pre visit review using our clinic review tool, if applicable. No additional management support is needed unless otherwise documented below in the visit note. 

## 2015-04-12 NOTE — Progress Notes (Signed)
Subjective:   Amy Bates is a 70 y.o. female who presents for Medicare Annual (Subsequent) preventive examination.  Review of Systems:   Review of Systems  Constitutional: Negative for activity change, appetite change and fatigue.  HENT: Negative for hearing loss, congestion, tinnitus and ear discharge.   Eyes: Negative for visual disturbance (see optho q1y -- vision corrected to 20/20 with glasses).  Respiratory: Negative for cough, chest tightness and shortness of breath.   Cardiovascular: Negative for chest pain, palpitations and leg swelling.  Gastrointestinal: Negative for abdominal pain, diarrhea, constipation and abdominal distention.  Genitourinary: Negative for urgency, frequency, decreased urine volume and difficulty urinating.  Musculoskeletal: Negative for back pain, arthralgias and gait problem.  Skin: Negative for color change, pallor and rash.  Neurological: Negative for dizziness, light-headedness, numbness and headaches.  Hematological: Negative for adenopathy. Does not bruise/bleed easily.  Psychiatric/Behavioral: Negative for suicidal ideas, confusion, sleep disturbance, self-injury, dysphoric mood, decreased concentration and agitation.  Pt is able to read and write and can do all ADLs No risk for falling No abuse/ violence in home          Objective:     Vitals: BP 114/72 mmHg  Pulse 54  Temp(Src) 97.9 F (36.6 C) (Oral)  Ht 5' 6" (1.676 m)  Wt 142 lb 3.2 oz (64.501 kg)  BMI 22.96 kg/m2  SpO2 94% BP 114/72 mmHg  Pulse 54  Temp(Src) 97.9 F (36.6 C) (Oral)  Ht 5' 6" (1.676 m)  Wt 142 lb 3.2 oz (64.501 kg)  BMI 22.96 kg/m2  SpO2 94% General appearance: alert, cooperative, appears stated age and no distress Head: Normocephalic, without obvious abnormality, atraumatic Eyes: negative findings: lids and lashes normal and pupils equal, round, reactive to light and accomodation Ears: normal TM's and external ear canals both ears Nose: Nares  normal. Septum midline. Mucosa normal. No drainage or sinus tenderness. Throat: lips, mucosa, and tongue normal; teeth and gums normal Neck: no adenopathy, no carotid bruit, no JVD, supple, symmetrical, trachea midline and thyroid not enlarged, symmetric, no tenderness/mass/nodules Back: symmetric, no curvature. ROM normal. No CVA tenderness. Lungs: clear to auscultation bilaterally Breasts: normal appearance, no masses or tenderness Heart: regular rate and rhythm, S1, S2 normal, no murmur, click, rub or gallop Abdomen: soft, non-tender; bowel sounds normal; no masses,  no organomegaly Pelvic: not indicated; post-menopausal, no abnormal Pap smears in past Extremities: extremities normal, atraumatic, no cyanosis or edema Pulses: 2+ and symmetric Skin: Skin color, texture, turgor normal. No rashes or lesions Lymph nodes: Cervical, supraclavicular, and axillary nodes normal. Neurologic: Mental status: orientation: time, date, person, place, city, president Gait: Normal Psych--+ depression Tobacco History  Smoking status  . Former Smoker  . Start date: 01/11/2013  Smokeless tobacco  . Never Used     Counseling given: Not Answered   Past Medical History  Diagnosis Date  . Arthritis   . Allergic rhinitis   . Asthma   . Depression   . GERD (gastroesophageal reflux disease)   . Hyperlipidemia   . Urinary incontinence   . History of chicken pox   . COPD (chronic obstructive pulmonary disease) ([place])   . Pneumonia   . Chest pain     [facility]  . Hypothyroid   . PRES (posterior reversible encephalopathy syndrome)   . Constipation   . Peripheral vascular disease ([place])   . Chronic kidney disease    Past Surgical History  Procedure Laterality Date  . Tubal ligation    .  Tonsillectomy    . Knee surgery      right  . Endovascular stent insertion N/A 01/11/2013    Procedure: ENDOVASCULAR STENT GRAFT INSERTION; ULTRASOUND GUIDED, Renal Stent left renal artery , open left  brachial artery closure.;  Surgeon: Serafina Mitchell, MD;  Location: [facility] OR;  Service: Vascular;  Laterality: N/A;  . Abdominal aortic aneurysm repair    . Joint replacement     Family History  Problem Relation Age of Onset  . Coronary artery disease Mother   . Stroke Mother   . Heart disease Mother     before age 25  . Hypertension Neg Hx   . Diabetes Neg Hx   . Cancer Neg Hx   . Multiple sclerosis Sister   . AAA (abdominal aortic aneurysm) Sister   . Heart disease Father    History  Sexual Activity  . Sexual Activity: No    Outpatient Encounter Prescriptions as of 04/12/2015  Medication Sig  . acetaminophen (TYLENOL) 500 MG tablet Take 500 mg by mouth every 6 (six) hours as needed.  Marland Kitchen albuterol (PROVENTIL HFA;VENTOLIN HFA) 108 (90 Base) MCG/ACT inhaler Inhale into the lungs every 6 (six) hours as needed for wheezing or shortness of breath.  Marland Kitchen atorvastatin (LIPITOR) 40 MG tablet Take 1 tablet (40 mg total) by mouth daily.  . Calcium Carbonate-Vitamin D (CALCIUM-D PO) Take 600-800 mg by mouth daily.  . fenofibrate 160 MG tablet Take 1 tablet (160 mg total) by mouth daily.  Marland Kitchen levothyroxine (SYNTHROID, LEVOTHROID) 75 MCG tablet Take 1 tablet (75 mcg total) by mouth daily.  Marland Kitchen loratadine (ALLERGY RELIEF) 10 MG tablet Take 10 mg by mouth daily.  Marland Kitchen neomycin-bacitracin-polymyxin (NEOSPORIN) [phone] ointment Apply topically 4 (four) times daily.  . pantoprazole (PROTONIX) 40 MG tablet Take 1 tablet (40 mg total) by mouth daily.  . polyethylene glycol (MIRALAX / GLYCOLAX) packet Take 17 g by mouth daily.   Marland Kitchen PROAIR HFA 108 (90 BASE) MCG/ACT inhaler INHALE 2 PUFFS BY MOUTH EVERY 6 HOURS AS NEEDED FOR WHEEZING  . sertraline (ZOLOFT) 100 MG tablet Take 100 mg by mouth daily.   Marland Kitchen tiotropium (SPIRIVA) 18 MCG inhalation capsule Place 18 mcg into inhaler and inhale as needed. Reported on 04/11/2015  . alendronate (FOSAMAX) 70 MG tablet TAKE 1 TABLET BY MOUTH WITH A FULL GLASS OF WATER ON AN EMPTY  STOMACH EVERY 7 DAYS.  . [DISCONTINUED] alendronate (FOSAMAX) 70 MG tablet TAKE 1 TABLET BY MOUTH WITH A FULL GLASS OF WATER ON AN EMPTY STOMACH EVERY 7 DAYS. (Patient not taking: Reported on 04/12/2015)  . [DISCONTINUED] calcium-vitamin D (OSCAL WITH D) 250-125 MG-UNIT per tablet Take 1 tablet by mouth daily.  . [DISCONTINUED] loratadine (CLARITIN) 10 MG tablet Take 10 mg by mouth daily. Reported on 04/11/2015  . [DISCONTINUED] mometasone (ASMANEX) 220 MCG/INH inhaler Inhale 2 puffs into the lungs. Reported on 04/11/2015  . [DISCONTINUED] NUEDEXTA 20-10 MG CAPS 2 (two) times daily. Reported on 04/11/2015  . [DISCONTINUED] sertraline (ZOLOFT) 25 MG tablet Take 1 tablet by mouth daily.   No facility-administered encounter medications on file as of 04/12/2015.    Activities of Daily Living In your present state of health, do you have any difficulty performing the following activities: 04/12/2015 08/02/2014  Hearing? N N  Vision? N N  Difficulty concentrating or making decisions? Y N  Walking or climbing stairs? N N  Dressing or bathing? N N  Doing errands, shopping? Y N    Patient Care Team: Kendrick Fries  Salem Caster, DO as PCP - General (Family Medicine) Norma Fredrickson, MD as Consulting Physician (Psychiatry) Pieter Partridge, DO as Consulting Physician (Neurology)    Assessment:    CPE Exercise Activities and Dietary recommendations---no    Goals    None     Fall Risk Fall Risk  04/12/2015 08/02/2014 03/01/2013 07/29/2012 07/28/2011  Falls in the past year? No No No No -  Risk for fall due to : - - - - Impaired mobility;Impaired balance/gait   Depression Screen PHQ 2/9 Scores 04/12/2015 04/12/2015 08/02/2014 03/01/2013  PHQ - 2 Score _0 0  PHQ- 9 Score 4 12 - -     Cognitive Testing MMSE - Mini Mental State Exam 11/07/2014  Orientation to time 4  Orientation to Place 5  Registration 3  Attention/ Calculation 5  Recall 1  Language- name 2 objects 1  Language- repeat 1  Language- follow 3  step command 3  Language- read & follow direction 1  Write a sentence 1  Copy design 1  Total score 26    Immunization History  Administered Date(s) Administered  . Influenza Split 01/24/2011, 02/02/2012  . Influenza, High Dose Seasonal PF 11/22/2014  . Influenza,inj,Quad PF,36+ Mos 01/31/2013, 12/16/2013  . Pneumococcal Conjugate-13 01/31/2014  . Pneumococcal Polysaccharide-23 03/17/2008, 11/22/2014   Screening Tests Health Maintenance  Topic Date Due  . Hepatitis C Screening  1945/03/22  . COLONOSCOPY  07/17/1996  . ZOSTAVAX  07/18/2006  . TETANUS/TDAP  01/31/2013  . INFLUENZA VACCINE  10/16/2015  . MAMMOGRAM  03/04/2017  . DEXA SCAN  Completed  . PNA vac Low Risk Adult  Completed      Plan:    see avs During the course of the visit the patient was educated and counseled about the following appropriate screening and preventive services:   Vaccines to include Pneumoccal, Influenza, Hepatitis B, Td, Zostavax, HCV  Electrocardiogram  Cardiovascular Disease  Colorectal cancer screening  Bone density screening  Diabetes screening  Glaucoma screening  Mammography/PAP  Nutrition counseling   Patient Instructions (the written plan) was given to the patient.  1. Hypothyroidism, unspecified hypothyroidism type Check labs - TSH  2. Hyperlipidemia Check labs - Comp Met (CMET) - CBC with Differential/Platelet - Lipid panel - TSH - Microalbumin / creatinine urine ratio - POCT urinalysis dipstick  3. Preventative health care See above  4. Osteoporosis   - alendronate (FOSAMAX) 70 MG tablet; TAKE 1 TABLET BY MOUTH WITH A FULL GLASS OF WATER ON AN EMPTY STOMACH EVERY 7 DAYS.  Dispense: 12 tablet; Refill: 3  5. PRES (posterior reversible encephalopathy syndrome)   - Ambulatory referral to Neurology  Garnet Koyanagi, DO  04/12/2015

## 2015-04-12 NOTE — Telephone Encounter (Signed)
Cologuard ordered via the Portal.    KP

## 2015-04-12 NOTE — Patient Instructions (Signed)
Preventive Care for Adults, Female A healthy lifestyle and preventive care can promote health and wellness. Preventive health guidelines for women include the following key practices.  A routine yearly physical is a good way to check with your health care provider about your health and preventive screening. It is a chance to share any concerns and updates on your health and to receive a thorough exam.  Visit your dentist for a routine exam and preventive care every 6 months. Brush your teeth twice a day and floss once a day. Good oral hygiene prevents tooth decay and gum disease.  The frequency of eye exams is based on your age, health, family medical history, use of contact lenses, and other factors. Follow your health care provider's recommendations for frequency of eye exams.  Eat a healthy diet. Foods like vegetables, fruits, whole grains, low-fat dairy products, and lean protein foods contain the nutrients you need without too many calories. Decrease your intake of foods high in solid fats, added sugars, and salt. Eat the right amount of calories for you.Get information about a proper diet from your health care provider, if necessary.  Regular physical exercise is one of the most important things you can do for your health. Most adults should get at least 150 minutes of moderate-intensity exercise (any activity that increases your heart rate and causes you to sweat) each week. In addition, most adults need muscle-strengthening exercises on 2 or more days a week.  Maintain a healthy weight. The body mass index (BMI) is a screening tool to identify possible weight problems. It provides an estimate of body fat based on height and weight. Your health care provider can find your BMI and can help you achieve or maintain a healthy weight.For adults 20 years and older:  A BMI below 18.5 is considered underweight.  A BMI of 18.5 to 24.9 is normal.  A BMI of 25 to 29.9 is considered overweight.  A  BMI of 30 and above is considered obese.  Maintain normal blood lipids and cholesterol levels by exercising and minimizing your intake of saturated fat. Eat a balanced diet with plenty of fruit and vegetables. Blood tests for lipids and cholesterol should begin at age 45 and be repeated every 5 years. If your lipid or cholesterol levels are high, you are over 50, or you are at high risk for heart disease, you may need your cholesterol levels checked more frequently.Ongoing high lipid and cholesterol levels should be treated with medicines if diet and exercise are not working.  If you smoke, find out from your health care provider how to quit. If you do not use tobacco, do not start.  Lung cancer screening is recommended for adults aged 45-80 years who are at high risk for developing lung cancer because of a history of smoking. A yearly low-dose CT scan of the lungs is recommended for people who have at least a 30-pack-year history of smoking and are a current smoker or have quit within the past 15 years. A pack year of smoking is smoking an average of 1 pack of cigarettes a day for 1 year (for example: 1 pack a day for 30 years or 2 packs a day for 15 years). Yearly screening should continue until the smoker has stopped smoking for at least 15 years. Yearly screening should be stopped for people who develop a health problem that would prevent them from having lung cancer treatment.  If you are pregnant, do not drink alcohol. If you are  breastfeeding, be very cautious about drinking alcohol. If you are not pregnant and choose to drink alcohol, do not have more than 1 drink per day. One drink is considered to be 12 ounces (355 mL) of beer, 5 ounces (148 mL) of wine, or 1.5 ounces (44 mL) of liquor.  Avoid use of street drugs. Do not share needles with anyone. Ask for help if you need support or instructions about stopping the use of drugs.  High blood pressure causes heart disease and increases the risk  of stroke. Your blood pressure should be checked at least every 1 to 2 years. Ongoing high blood pressure should be treated with medicines if weight loss and exercise do not work.  If you are 55-79 years old, ask your health care provider if you should take aspirin to prevent strokes.  Diabetes screening is done by taking a blood sample to check your blood glucose level after you have not eaten for a certain period of time (fasting). If you are not overweight and you do not have risk factors for diabetes, you should be screened once every 3 years starting at age 45. If you are overweight or obese and you are 40-70 years of age, you should be screened for diabetes every year as part of your cardiovascular risk assessment.  Breast cancer screening is essential preventive care for women. You should practice "breast self-awareness." This means understanding the normal appearance and feel of your breasts and may include breast self-examination. Any changes detected, no matter how small, should be reported to a health care provider. Women in their 20s and 30s should have a clinical breast exam (CBE) by a health care provider as part of a regular health exam every 1 to 3 years. After age 40, women should have a CBE every year. Starting at age 40, women should consider having a mammogram (breast X-ray test) every year. Women who have a family history of breast cancer should talk to their health care provider about genetic screening. Women at a high risk of breast cancer should talk to their health care providers about having an MRI and a mammogram every year.  Breast cancer gene (BRCA)-related cancer risk assessment is recommended for women who have family members with BRCA-related cancers. BRCA-related cancers include breast, ovarian, tubal, and peritoneal cancers. Having family members with these cancers may be associated with an increased risk for harmful changes (mutations) in the breast cancer genes BRCA1 and  BRCA2. Results of the assessment will determine the need for genetic counseling and BRCA1 and BRCA2 testing.  Your health care provider may recommend that you be screened regularly for cancer of the pelvic organs (ovaries, uterus, and vagina). This screening involves a pelvic examination, including checking for microscopic changes to the surface of your cervix (Pap test). You may be encouraged to have this screening done every 3 years, beginning at age 21.  For women ages 30-65, health care providers may recommend pelvic exams and Pap testing every 3 years, or they may recommend the Pap and pelvic exam, combined with testing for human papilloma virus (HPV), every 5 years. Some types of HPV increase your risk of cervical cancer. Testing for HPV may also be done on women of any age with unclear Pap test results.  Other health care providers may not recommend any screening for nonpregnant women who are considered low risk for pelvic cancer and who do not have symptoms. Ask your health care provider if a screening pelvic exam is right for   you.  If you have had past treatment for cervical cancer or a condition that could lead to cancer, you need Pap tests and screening for cancer for at least 20 years after your treatment. If Pap tests have been discontinued, your risk factors (such as having a new sexual partner) need to be reassessed to determine if screening should resume. Some women have medical problems that increase the chance of getting cervical cancer. In these cases, your health care provider may recommend more frequent screening and Pap tests.  Colorectal cancer can be detected and often prevented. Most routine colorectal cancer screening begins at the age of 50 years and continues through age 75 years. However, your health care provider may recommend screening at an earlier age if you have risk factors for colon cancer. On a yearly basis, your health care provider may provide home test kits to check  for hidden blood in the stool. Use of a small camera at the end of a tube, to directly examine the colon (sigmoidoscopy or colonoscopy), can detect the earliest forms of colorectal cancer. Talk to your health care provider about this at age 50, when routine screening begins. Direct exam of the colon should be repeated every 5-10 years through age 75 years, unless early forms of precancerous polyps or small growths are found.  People who are at an increased risk for hepatitis B should be screened for this virus. You are considered at high risk for hepatitis B if:  You were born in a country where hepatitis B occurs often. Talk with your health care provider about which countries are considered high risk.  Your parents were born in a high-risk country and you have not received a shot to protect against hepatitis B (hepatitis B vaccine).  You have HIV or AIDS.  You use needles to inject street drugs.  You live with, or have sex with, someone who has hepatitis B.  You get hemodialysis treatment.  You take certain medicines for conditions like cancer, organ transplantation, and autoimmune conditions.  Hepatitis C blood testing is recommended for all people born from 1945 through 1965 and any individual with known risks for hepatitis C.  Practice safe sex. Use condoms and avoid high-risk sexual practices to reduce the spread of sexually transmitted infections (STIs). STIs include gonorrhea, chlamydia, syphilis, trichomonas, herpes, HPV, and human immunodeficiency virus (HIV). Herpes, HIV, and HPV are viral illnesses that have no cure. They can result in disability, cancer, and death.  You should be screened for sexually transmitted illnesses (STIs) including gonorrhea and chlamydia if:  You are sexually active and are younger than 24 years.  You are older than 24 years and your health care provider tells you that you are at risk for this type of infection.  Your sexual activity has changed  since you were last screened and you are at an increased risk for chlamydia or gonorrhea. Ask your health care provider if you are at risk.  If you are at risk of being infected with HIV, it is recommended that you take a prescription medicine daily to prevent HIV infection. This is called preexposure prophylaxis (PrEP). You are considered at risk if:  You are sexually active and do not regularly use condoms or know the HIV status of your partner(s).  You take drugs by injection.  You are sexually active with a partner who has HIV.  Talk with your health care provider about whether you are at high risk of being infected with HIV. If   you choose to begin PrEP, you should first be tested for HIV. You should then be tested every 3 months for as long as you are taking PrEP.  Osteoporosis is a disease in which the bones lose minerals and strength with aging. This can result in serious bone fractures or breaks. The risk of osteoporosis can be identified using a bone density scan. Women ages 67 years and over and women at risk for fractures or osteoporosis should discuss screening with their health care providers. Ask your health care provider whether you should take a calcium supplement or vitamin D to reduce the rate of osteoporosis.  Menopause can be associated with physical symptoms and risks. Hormone replacement therapy is available to decrease symptoms and risks. You should talk to your health care provider about whether hormone replacement therapy is right for you.  Use sunscreen. Apply sunscreen liberally and repeatedly throughout the day. You should seek shade when your shadow is shorter than you. Protect yourself by wearing long sleeves, pants, a wide-brimmed hat, and sunglasses year round, whenever you are outdoors.  Once a month, do a whole body skin exam, using a mirror to look at the skin on your back. Tell your health care provider of new moles, moles that have irregular borders, moles that  are larger than a pencil eraser, or moles that have changed in shape or color.  Stay current with required vaccines (immunizations).  Influenza vaccine. All adults should be immunized every year.  Tetanus, diphtheria, and acellular pertussis (Td, Tdap) vaccine. Pregnant women should receive 1 dose of Tdap vaccine during each pregnancy. The dose should be obtained regardless of the length of time since the last dose. Immunization is preferred during the 27th-36th week of gestation. An adult who has not previously received Tdap or who does not know her vaccine status should receive 1 dose of Tdap. This initial dose should be followed by tetanus and diphtheria toxoids (Td) booster doses every 10 years. Adults with an unknown or incomplete history of completing a 3-dose immunization series with Td-containing vaccines should begin or complete a primary immunization series including a Tdap dose. Adults should receive a Td booster every 10 years.  Varicella vaccine. An adult without evidence of immunity to varicella should receive 2 doses or a second dose if she has previously received 1 dose. Pregnant females who do not have evidence of immunity should receive the first dose after pregnancy. This first dose should be obtained before leaving the health care facility. The second dose should be obtained 4-8 weeks after the first dose.  Human papillomavirus (HPV) vaccine. Females aged 13-26 years who have not received the vaccine previously should obtain the 3-dose series. The vaccine is not recommended for use in pregnant females. However, pregnancy testing is not needed before receiving a dose. If a female is found to be pregnant after receiving a dose, no treatment is needed. In that case, the remaining doses should be delayed until after the pregnancy. Immunization is recommended for any person with an immunocompromised condition through the age of 61 years if she did not get any or all doses earlier. During the  3-dose series, the second dose should be obtained 4-8 weeks after the first dose. The third dose should be obtained 24 weeks after the first dose and 16 weeks after the second dose.  Zoster vaccine. One dose is recommended for adults aged 30 years or older unless certain conditions are present.  Measles, mumps, and rubella (MMR) vaccine. Adults born  before 1957 generally are considered immune to measles and mumps. Adults born in 1957 or later should have 1 or more doses of MMR vaccine unless there is a contraindication to the vaccine or there is laboratory evidence of immunity to each of the three diseases. A routine second dose of MMR vaccine should be obtained at least 28 days after the first dose for students attending postsecondary schools, health care workers, or international travelers. People who received inactivated measles vaccine or an unknown type of measles vaccine during 1963-1967 should receive 2 doses of MMR vaccine. People who received inactivated mumps vaccine or an unknown type of mumps vaccine before 1979 and are at high risk for mumps infection should consider immunization with 2 doses of MMR vaccine. For females of childbearing age, rubella immunity should be determined. If there is no evidence of immunity, females who are not pregnant should be vaccinated. If there is no evidence of immunity, females who are pregnant should delay immunization until after pregnancy. Unvaccinated health care workers born before 1957 who lack laboratory evidence of measles, mumps, or rubella immunity or laboratory confirmation of disease should consider measles and mumps immunization with 2 doses of MMR vaccine or rubella immunization with 1 dose of MMR vaccine.  Pneumococcal 13-valent conjugate (PCV13) vaccine. When indicated, a person who is uncertain of his immunization history and has no record of immunization should receive the PCV13 vaccine. All adults 65 years of age and older should receive this  vaccine. An adult aged 19 years or older who has certain medical conditions and has not been previously immunized should receive 1 dose of PCV13 vaccine. This PCV13 should be followed with a dose of pneumococcal polysaccharide (PPSV23) vaccine. Adults who are at high risk for pneumococcal disease should obtain the PPSV23 vaccine at least 8 weeks after the dose of PCV13 vaccine. Adults older than 69 years of age who have normal immune system function should obtain the PPSV23 vaccine dose at least 1 year after the dose of PCV13 vaccine.  Pneumococcal polysaccharide (PPSV23) vaccine. When PCV13 is also indicated, PCV13 should be obtained first. All adults aged 65 years and older should be immunized. An adult younger than age 65 years who has certain medical conditions should be immunized. Any person who resides in a nursing home or long-term care facility should be immunized. An adult smoker should be immunized. People with an immunocompromised condition and certain other conditions should receive both PCV13 and PPSV23 vaccines. People with human immunodeficiency virus (HIV) infection should be immunized as soon as possible after diagnosis. Immunization during chemotherapy or radiation therapy should be avoided. Routine use of PPSV23 vaccine is not recommended for American Indians, [place] Natives, or people younger than 65 years unless there are medical conditions that require PPSV23 vaccine. When indicated, people who have unknown immunization and have no record of immunization should receive PPSV23 vaccine. One-time revaccination 5 years after the first dose of PPSV23 is recommended for people aged 19-64 years who have chronic kidney failure, nephrotic syndrome, asplenia, or immunocompromised conditions. People who received 1-2 doses of PPSV23 before age 65 years should receive another dose of PPSV23 vaccine at age 65 years or later if at least 5 years have passed since the previous dose. Doses of PPSV23 are not  needed for people immunized with PPSV23 at or after age 65 years.  Meningococcal vaccine. Adults with asplenia or persistent complement component deficiencies should receive 2 doses of quadrivalent meningococcal conjugate (MenACWY-D) vaccine. The doses should be obtained   at least 2 months apart. Microbiologists working with certain meningococcal bacteria, [place] recruits, people at risk during an outbreak, and people who travel to or live in countries with a high rate of meningitis should be immunized. A first-year college student up through age 34 years who is living in a residence hall should receive a dose if she did not receive a dose on or after her 16th birthday. Adults who have certain high-risk conditions should receive one or more doses of vaccine.  Hepatitis A vaccine. Adults who wish to be protected from this disease, have certain high-risk conditions, work with hepatitis A-infected animals, work in hepatitis A research labs, or travel to or work in countries with a high rate of hepatitis A should be immunized. Adults who were previously unvaccinated and who anticipate close contact with an international adoptee during the first 60 days after arrival in the [place] from a country with a high rate of hepatitis A should be immunized.  Hepatitis B vaccine. Adults who wish to be protected from this disease, have certain high-risk conditions, may be exposed to blood or other infectious body fluids, are household contacts or sex partners of hepatitis B positive people, are clients or workers in certain care facilities, or travel to or work in countries with a high rate of hepatitis B should be immunized.  Haemophilus influenzae type b (Hib) vaccine. A previously unvaccinated person with asplenia or sickle cell disease or having a scheduled splenectomy should receive 1 dose of Hib vaccine. Regardless of previous immunization, a recipient of a hematopoietic stem cell transplant should receive a  3-dose series 6-12 months after her successful transplant. Hib vaccine is not recommended for adults with HIV infection. Preventive Services / Frequency Ages 35 to 4 years  Blood pressure check.** / Every 3-5 years.  Lipid and cholesterol check.** / Every 5 years beginning at age 60.  Clinical breast exam.** / Every 3 years for women in their 71s and 10s.  BRCA-related cancer risk assessment.** / For women who have family members with a BRCA-related cancer (breast, ovarian, tubal, or peritoneal cancers).  Pap test.** / Every 2 years from ages 76 through 26. Every 3 years starting at age 61 through age 76 or 93 with a history of 3 consecutive normal Pap tests.  HPV screening.** / Every 3 years from ages 37 through ages 60 to 51 with a history of 3 consecutive normal Pap tests.  Hepatitis C blood test.** / For any individual with known risks for hepatitis C.  Skin self-exam. / Monthly.  Influenza vaccine. / Every year.  Tetanus, diphtheria, and acellular pertussis (Tdap, Td) vaccine.** / Consult your health care provider. Pregnant women should receive 1 dose of Tdap vaccine during each pregnancy. 1 dose of Td every 10 years.  Varicella vaccine.** / Consult your health care provider. Pregnant females who do not have evidence of immunity should receive the first dose after pregnancy.  HPV vaccine. / 3 doses over 6 months, if 93 and younger. The vaccine is not recommended for use in pregnant females. However, pregnancy testing is not needed before receiving a dose.  Measles, mumps, rubella (MMR) vaccine.** / You need at least 1 dose of MMR if you were born in 1957 or later. You may also need a 2nd dose. For females of childbearing age, rubella immunity should be determined. If there is no evidence of immunity, females who are not pregnant should be vaccinated. If there is no evidence of immunity, females who are  pregnant should delay immunization until after pregnancy.  Pneumococcal  13-valent conjugate (PCV13) vaccine.** / Consult your health care provider.  Pneumococcal polysaccharide (PPSV23) vaccine.** / 1 to 2 doses if you smoke cigarettes or if you have certain conditions.  Meningococcal vaccine.** / 1 dose if you are age 68 to 8 years and a Market researcher living in a residence hall, or have one of several medical conditions, you need to get vaccinated against meningococcal disease. You may also need additional booster doses.  Hepatitis A vaccine.** / Consult your health care provider.  Hepatitis B vaccine.** / Consult your health care provider.  Haemophilus influenzae type b (Hib) vaccine.** / Consult your health care provider. Ages 7 to 53 years  Blood pressure check.** / Every year.  Lipid and cholesterol check.** / Every 5 years beginning at age 25 years.  Lung cancer screening. / Every year if you are aged 11-80 years and have a 30-pack-year history of smoking and currently smoke or have quit within the past 15 years. Yearly screening is stopped once you have quit smoking for at least 15 years or develop a health problem that would prevent you from having lung cancer treatment.  Clinical breast exam.** / Every year after age 48 years.  BRCA-related cancer risk assessment.** / For women who have family members with a BRCA-related cancer (breast, ovarian, tubal, or peritoneal cancers).  Mammogram.** / Every year beginning at age 41 years and continuing for as long as you are in good health. Consult with your health care provider.  Pap test.** / Every 3 years starting at age 65 years through age 37 or 70 years with a history of 3 consecutive normal Pap tests.  HPV screening.** / Every 3 years from ages 72 years through ages 60 to 40 years with a history of 3 consecutive normal Pap tests.  Fecal occult blood test (FOBT) of stool. / Every year beginning at age 21 years and continuing until age 5 years. You may not need to do this test if you get  a colonoscopy every 10 years.  Flexible sigmoidoscopy or colonoscopy.** / Every 5 years for a flexible sigmoidoscopy or every 10 years for a colonoscopy beginning at age 35 years and continuing until age 48 years.  Hepatitis C blood test.** / For all people born from [birth year] through 1965 and any individual with known risks for hepatitis C.  Skin self-exam. / Monthly.  Influenza vaccine. / Every year.  Tetanus, diphtheria, and acellular pertussis (Tdap/Td) vaccine.** / Consult your health care provider. Pregnant women should receive 1 dose of Tdap vaccine during each pregnancy. 1 dose of Td every 10 years.  Varicella vaccine.** / Consult your health care provider. Pregnant females who do not have evidence of immunity should receive the first dose after pregnancy.  Zoster vaccine.** / 1 dose for adults aged 30 years or older.  Measles, mumps, rubella (MMR) vaccine.** / You need at least 1 dose of MMR if you were born in 1957 or later. You may also need a second dose. For females of childbearing age, rubella immunity should be determined. If there is no evidence of immunity, females who are not pregnant should be vaccinated. If there is no evidence of immunity, females who are pregnant should delay immunization until after pregnancy.  Pneumococcal 13-valent conjugate (PCV13) vaccine.** / Consult your health care provider.  Pneumococcal polysaccharide (PPSV23) vaccine.** / 1 to 2 doses if you smoke cigarettes or if you have certain conditions.  Meningococcal vaccine.** /  Consult your health care provider.  Hepatitis A vaccine.** / Consult your health care provider.  Hepatitis B vaccine.** / Consult your health care provider.  Haemophilus influenzae type b (Hib) vaccine.** / Consult your health care provider. Ages 64 years and over  Blood pressure check.** / Every year.  Lipid and cholesterol check.** / Every 5 years beginning at age 23 years.  Lung cancer screening. / Every year if you  are aged 16-80 years and have a 30-pack-year history of smoking and currently smoke or have quit within the past 15 years. Yearly screening is stopped once you have quit smoking for at least 15 years or develop a health problem that would prevent you from having lung cancer treatment.  Clinical breast exam.** / Every year after age 74 years.  BRCA-related cancer risk assessment.** / For women who have family members with a BRCA-related cancer (breast, ovarian, tubal, or peritoneal cancers).  Mammogram.** / Every year beginning at age 44 years and continuing for as long as you are in good health. Consult with your health care provider.  Pap test.** / Every 3 years starting at age 58 years through age 22 or 39 years with 3 consecutive normal Pap tests. Testing can be stopped between 65 and 70 years with 3 consecutive normal Pap tests and no abnormal Pap or HPV tests in the past 10 years.  HPV screening.** / Every 3 years from ages 64 years through ages 70 or 61 years with a history of 3 consecutive normal Pap tests. Testing can be stopped between 65 and 70 years with 3 consecutive normal Pap tests and no abnormal Pap or HPV tests in the past 10 years.  Fecal occult blood test (FOBT) of stool. / Every year beginning at age 40 years and continuing until age 27 years. You may not need to do this test if you get a colonoscopy every 10 years.  Flexible sigmoidoscopy or colonoscopy.** / Every 5 years for a flexible sigmoidoscopy or every 10 years for a colonoscopy beginning at age 7 years and continuing until age 32 years.  Hepatitis C blood test.** / For all people born from [birth year] through 1964 and any individual with known risks for hepatitis C.  Osteoporosis screening.** / A one-time screening for women ages 30 years and over and women at risk for fractures or osteoporosis.  Skin self-exam. / Monthly.  Influenza vaccine. / Every year.  Tetanus, diphtheria, and acellular pertussis (Tdap/Td)  vaccine.** / 1 dose of Td every 10 years.  Varicella vaccine.** / Consult your health care provider.  Zoster vaccine.** / 1 dose for adults aged 35 years or older.  Pneumococcal 13-valent conjugate (PCV13) vaccine.** / Consult your health care provider.  Pneumococcal polysaccharide (PPSV23) vaccine.** / 1 dose for all adults aged 46 years and older.  Meningococcal vaccine.** / Consult your health care provider.  Hepatitis A vaccine.** / Consult your health care provider.  Hepatitis B vaccine.** / Consult your health care provider.  Haemophilus influenzae type b (Hib) vaccine.** / Consult your health care provider. ** Family history and personal history of risk and conditions may change your health care provider's recommendations.   This information is not intended to replace advice given to you by your health care provider. Make sure you discuss any questions you have with your health care provider.   Document Released: 04/29/2001 Document Revised: 03/24/2014 Document Reviewed: 07/29/2010 Elsevier Interactive Patient Education [employer].

## 2015-04-13 LAB — HEPATITIS C ANTIBODY: HCV AB: NEGATIVE

## 2015-04-17 ENCOUNTER — Telehealth: Payer: Self-pay | Admitting: Family Medicine

## 2015-04-17 DIAGNOSIS — Z1212 Encounter for screening for malignant neoplasm of rectum: Secondary | ICD-10-CM | POA: Diagnosis not present

## 2015-04-17 DIAGNOSIS — Z1211 Encounter for screening for malignant neoplasm of colon: Secondary | ICD-10-CM | POA: Diagnosis not present

## 2015-04-17 NOTE — Telephone Encounter (Signed)
Please advise      KP 

## 2015-04-17 NOTE — Telephone Encounter (Signed)
We referred to neuro at [facility]

## 2015-04-17 NOTE — Telephone Encounter (Signed)
Caller name: Caryn Bee  Relationship to patient: Spouse   Can be reached: [phone]  Reason for call: Pt's spouse called in because he says that his wife need a MRI referral to [facility] for her brain condition called press.

## 2015-04-19 NOTE — Telephone Encounter (Signed)
Caryn Bee has been made aware and he voiced understanding.    KP

## 2015-04-23 ENCOUNTER — Other Ambulatory Visit: Payer: Self-pay | Admitting: Family Medicine

## 2015-04-23 NOTE — Telephone Encounter (Signed)
Medication filled to pharmacy as requested.   

## 2015-05-01 LAB — COLOGUARD: Cologuard: NEGATIVE

## 2015-05-04 ENCOUNTER — Other Ambulatory Visit: Payer: Self-pay | Admitting: Family Medicine

## 2015-05-05 DIAGNOSIS — R930 Abnormal findings on diagnostic imaging of skull and head, not elsewhere classified: Secondary | ICD-10-CM | POA: Diagnosis not present

## 2015-05-11 ENCOUNTER — Ambulatory Visit (INDEPENDENT_AMBULATORY_CARE_PROVIDER_SITE_OTHER): Payer: Commercial Managed Care - HMO | Admitting: Neurology

## 2015-05-11 ENCOUNTER — Other Ambulatory Visit: Payer: Commercial Managed Care - HMO

## 2015-05-11 ENCOUNTER — Encounter: Payer: Self-pay | Admitting: Neurology

## 2015-05-11 VITALS — BP 116/62 | HR 74 | Ht 67.0 in | Wt 139.7 lb

## 2015-05-11 DIAGNOSIS — F482 Pseudobulbar affect: Secondary | ICD-10-CM

## 2015-05-11 DIAGNOSIS — G931 Anoxic brain damage, not elsewhere classified: Secondary | ICD-10-CM | POA: Diagnosis not present

## 2015-05-11 DIAGNOSIS — I6783 Posterior reversible encephalopathy syndrome: Secondary | ICD-10-CM

## 2015-05-11 DIAGNOSIS — R4189 Other symptoms and signs involving cognitive functions and awareness: Secondary | ICD-10-CM | POA: Diagnosis not present

## 2015-05-11 LAB — VITAMIN B12: VITAMIN B 12: 489 pg/mL (ref 200–1100)

## 2015-05-11 NOTE — Patient Instructions (Signed)
I think it is a good idea to see a specialist for PRES In the meantime, I will check a vitamin B12 level.  Low B12 may cause cognitive changes as well

## 2015-05-11 NOTE — Progress Notes (Signed)
NEUROLOGY FOLLOW UP OFFICE NOTE  Amy Bates [id number]  HISTORY OF PRESENT ILLNESS: Amy Bates is a 70 year old right-handed woman with history of COPD, hypothyroidism, hyperlipidemia, depression, renal artery occlusion, metal in jaw, and abdominal aortic aneurysm who follows up for PRES and anoxic brain injury.  She is accompanied by her husband who provides some history.  Labs and imaging of recent MRI reviewed.  UPDATE: Nudexta and sertraline has been ineffective for pseudobulbar affect and depression.  Her husband reports a gradual regression since last visit in August.  She exhibits more word-finding trouble.  She is able to get around [place] but cannot recall names of streets.  She has trouble naming colors.  She is able to name her children but sometimes has trouble naming grandchildren and great-grandchildren.  She often gets angry.  She has not had episodes of delirium.  She will be transferring care back to [facility], where there is a specialist available.  She had a recent MRI of the brain with and without contrast from 05/05/15, which was personally reviewed.  It reveals stable chronic white matter changes but no acute intracranial pathology. Recent TSH 2.43.  HISTORY: She was found to have a 5.4 x 4.8cm unruptured infrarenal abdominal aortic aneurysm after experiencing three weeks of abdominal pain.  She underwent endovascular repair and stenting on 01/11/13.   Following the procedure, she began feeling confused.  She initially began having word-finding difficulties where she would lose track of thought and would fumble for the words.  She understands others and is able to read without difficulty.  This was intermittent but becoming more frequent.  Whenever she has word-finding difficulties, her husband will start asking her questions to test her.  She presented to the ED on 03/15/13 because she was not able to tell her husband the year or her daughters' names.  CT Head  revealed chronic small vessel ischemic changes, but no evidence of acute infarct, mass lesion or hemorrhage.  She was diagnosed with an upper respiratory infection.  She also began experiencing bi-frontal and retro-orbital headaches, described as pounding, about 6/10 (not worse headache of her life) and not associated with nausea, vomiting, photophobia, phonophobia, or visual disturbance.  She denies neck or shoulder pain.  It would occur about 3 days a week and would last a couple of hours.  Onset seems to occur later in the day.  It is not positional.  She denies vision problems.  Sertraline was ineffective.  She continued to have a deterioration of depressive symptoms.  She was placed on Abilify and her psychiatrist later started Wellbutrin.  She was admitted to [facility] on 05/27/13 for altered mental status.  Initial CT of the head showed possible thin isodense subdural hematoma, but was thought to be artifact, as repeat imaging did not reveal this.  However, the repeat CT of the head revealed confluent generalized white matter hypodensities in the cerebrum, with reduced conspicuity of the sulci when compared to prior CT on 03/15/13, thought was mentioned to be associated with PRES.  EEG revealed generalized background slowing but no seizure or epileptiform activity.  Wellbutrin was discontinued.    She was subsequently hospitalized at [facility] on 06/08/13 for worsening mental status and aphasia.  With her history of metal in her jaw, they used a low-magnet MRI of the brain with and without contrast, which revealed diffuse cerebral edema with central mass effect on the lateral ventricles, sequela of PRES versus encephalitis.  There was also  evidence of acute lacunar infarct within the posterior left periventricular white matter.  She underwent long-term EEG monitoring which revealed generalized slowing but no seizures or epileptiform activity.  She underwent workup for infectious, paraneoplastic, autoimmune  and demyelinating etiologies.  She underwent LP, which revealed cell count 1, CMV PCR negative, EBV-PCR negative, VZV PCR negative, HSV PCR negative, gram stain and culture negative, cryptococal antigen negative, lactate 1.3, protein 87, glucose 63.  JCV antibody negative.  OCBs were noted in CSF and serum.  Other labs included NMDA receptor antibody negative, paraneoplastic panel negative.  B1 was 81.  ESR 20.  CRP 4.1.  Mg 1.8.  Phosphorus 3.5.  UA was positive.  CT of chest/abdomen/pelvis did not reveal cancer.  Blood pressure was managed, but she did not improve.  It was suspected that she may have an autoimmune encephalitis, so she was treated with Solumedrol for 3 days.  PLEX was considered but held because she improved on the steroids.    Ultimately, she exhibited cognitive deficits, as demonstrated by poor memory, insight, attention and concentration.  She was discharged to Rehab at [facility], where she showed much improvement.  Abilify was stopped.  She exhibited a short steppage gait with flexed posture, so Sinemet was initiated.  She was placed on Seroquel and Zoloft.  She developed marked improvement in cognitive deficits, however memory was still an issue.  Gait improved, so Sinemet was discontinued.  She was taking amitriptyline for headache, which has since been discontinued.  She was started on Neudexta for pseudobulbar affect, and was subsequently discontinued as it seemed ineffective.  She had formal neuropsychological testing with Dr. Valentina Shaggy in August.  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  She exhibited borderline-range verbal comprehension and auditory working memory.  She exhibited impaired executive functioning.  Nuedexta was ineffective.  She had a repeat MRI of the brain performed at [facility] on 11/16/13 (actually scans not available for review): 1. Substantial interval improvement in the previous confluent T2/FLAIR signal abnormality  distributed throughout the bilateral cerebral white matter with posterior predominance, most likely reflecting resolving PRES (posterior reversible encephalopathy syndrome). There has been a concomitant decrease in sulcal and ventricular effacement. 2. Persistent areas of periventricular and subcortical T2 hyperintensity likely reflect background of chronic microvascular ischemic white matter changes, although a component of residual PRES-related signal abnormality could have this appearance. 3. Innumerable foci of susceptibility artifact along the cortex and at the gray-white interface of the bilateral cerebrum are most consistent with microhemorrhages. Increased number of these foci suggests worsening from prior, although this could partially be related to differences in technique.  Repeat MRI of brain with and without contrast at [facility] on 10/25/14 was stable: No evidence of acute abnormality. Stable patchy T2/flair hyperintensities throughout the bilateral periventricular and subcortical white matter. No evidence of acute ischemia. No mass effect, acute hemorrhage, or hydrocephalus. Redemonstrated multiple scattered foci of susceptibility artifact throughout the bilateral subcortical regions with sparing of the basal ganglia and posterior fossa. No abnormal enhancement to suggest neoplasm, abscess, or mass lesion. Grossly normal flow-related signal in the major intracranial arteries and dural sinuses.   Family history is significant for father with Alzheimer's disease and aunt with cerebral aneurysm.  PAST MEDICAL HISTORY: Past Medical History  Diagnosis Date  . Arthritis   . Allergic rhinitis   . Asthma   . Depression   . GERD (gastroesophageal reflux disease)   . Hyperlipidemia   . Urinary incontinence   . History  of chicken pox   . COPD (chronic obstructive pulmonary disease) ([place])   . Pneumonia   . Chest pain     [facility]  . Hypothyroid   . PRES (posterior reversible  encephalopathy syndrome)   . Constipation   . Peripheral vascular disease ([place])   . Chronic kidney disease     MEDICATIONS: Current Outpatient Prescriptions on File Prior to Visit  Medication Sig Dispense Refill  . acetaminophen (TYLENOL) 500 MG tablet Take 500 mg by mouth every 6 (six) hours as needed.    Marland Kitchen albuterol (PROVENTIL HFA;VENTOLIN HFA) 108 (90 Base) MCG/ACT inhaler Inhale into the lungs every 6 (six) hours as needed for wheezing or shortness of breath.    Marland Kitchen alendronate (FOSAMAX) 70 MG tablet TAKE 1 TABLET BY MOUTH WITH A FULL GLASS OF WATER ON AN EMPTY STOMACH EVERY 7 DAYS. 12 tablet 3  . atorvastatin (LIPITOR) 40 MG tablet Take 1 tablet (40 mg total) by mouth daily. 90 tablet 1  . Calcium Carbonate-Vitamin D (CALCIUM-D PO) Take 600-800 mg by mouth daily.    . fenofibrate 160 MG tablet Take 1 tablet (160 mg total) by mouth daily. 90 tablet 1  . levothyroxine (SYNTHROID, LEVOTHROID) 75 MCG tablet Take 1 tablet (75 mcg total) by mouth daily. 90 tablet 1  . loratadine (ALLERGY RELIEF) 10 MG tablet Take 10 mg by mouth daily.    Marland Kitchen neomycin-bacitracin-polymyxin (NEOSPORIN) [phone] ointment Apply topically 4 (four) times daily.    . pantoprazole (PROTONIX) 40 MG tablet TAKE 1 TABLET EVERY DAY 90 tablet 1  . polyethylene glycol (MIRALAX / GLYCOLAX) packet Take 17 g by mouth daily.     Marland Kitchen PROAIR HFA 108 (90 Base) MCG/ACT inhaler INHALE 2 PUFFS BY MOUTH EVERY 6 HOURS AS NEEDED FOR WHEEZING 8.5 g 5  . sertraline (ZOLOFT) 100 MG tablet Take 100 mg by mouth daily.     Marland Kitchen tiotropium (SPIRIVA) 18 MCG inhalation capsule Place 18 mcg into inhaler and inhale as needed. Reported on 04/11/2015     No current facility-administered medications on file prior to visit.    ALLERGIES: Allergies  Allergen Reactions  . Ibuprofen Other (See Comments)    Patient has a history of ulcers    FAMILY HISTORY: Family History  Problem Relation Age of Onset  . Coronary artery disease Mother   . Stroke  Mother   . Heart disease Mother     before age 68  . Hypertension Neg Hx   . Diabetes Neg Hx   . Cancer Neg Hx   . Multiple sclerosis Sister   . AAA (abdominal aortic aneurysm) Sister   . Heart disease Father     SOCIAL HISTORY: Social History   Social History  . Marital Status: Married    Spouse Name: N/A  . Number of Children: 3  . Years of Education: N/A   Occupational History  . Retired Chief Operating Officer    Social History Main Topics  . Smoking status: Former Smoker    Start date: 01/11/2013  . Smokeless tobacco: Never Used  . Alcohol Use: No  . Drug Use: No  . Sexual Activity: No   Other Topics Concern  . Not on file   Social History Narrative   Lives with mother-in-law.    REVIEW OF SYSTEMS: Constitutional: No fevers, chills, or sweats, no generalized fatigue, change in appetite Eyes: No visual changes, double vision, eye pain Ear, nose and throat: No hearing loss, ear pain, nasal congestion, sore throat Cardiovascular: No  chest pain, palpitations Respiratory:  No shortness of breath at rest or with exertion, wheezes GastrointestinaI: No nausea, vomiting, diarrhea, abdominal pain, fecal incontinence Genitourinary:  No dysuria, urinary retention or frequency Musculoskeletal:  No neck pain, back pain Integumentary: No rash, pruritus, skin lesions Neurological: as above Psychiatric: No depression, insomnia, anxiety Endocrine: No palpitations, fatigue, diaphoresis, mood swings, change in appetite, change in weight, increased thirst Hematologic/Lymphatic:  No anemia, purpura, petechiae. Allergic/Immunologic: no itchy/runny eyes, nasal congestion, recent allergic reactions, rashes  PHYSICAL EXAM: Filed Vitals:   05/11/15 1509  BP: 116/62  Pulse: 74   General: No acute distress.  Patient appears well-groomed.   Head:  Normocephalic/atraumatic Eyes:  Fundi not visualized on exam Neck: supple, no paraspinal tenderness, full range of motion Heart:  Regular rate and  rhythm Lungs:  Clear to auscultation bilaterally Back: No paraspinal tenderness Neurological Exam: easily cries, alert and oriented to person, place, day of week and month.  She made some paraphasic errors (2027 for year, said February for season). Attention span and concentration impaired, delayed recall 0 of 3 words, unable to spell WORLD backward, remote memory intact, fund of knowledge intact.  Although some decreased verbal output, speech is fluent and not dysarthric, difficulty with naming, able to repeat and follow complex commands.     MMSE - Mini Mental State Exam 05/11/2015 11/07/2014  Orientation to time 2 4  Orientation to Place 4 5  Registration 0 3  Attention/ Calculation 0 5  Recall 0 1  Language- name 2 objects 2 1  Language- repeat 1 1  Language- follow 3 step command 2 3  Language- read & follow direction 1 1  Write a sentence 1 1  Copy design 1 1  Total score 14 26   CN II-XII intact. Bulk and tone normal, muscle strength 5/5 throughout.  Sensation to light touch, temperature and vibration intact.  Deep tendon reflexes 2+ throughout, toes downgoing.  Finger to nose and heel to shin testing intact.  Gait normal, Romberg negative.  IMPRESSION: History of PRES Anoxic brain injury cognitive impairment, particularly word-finding problems. Pseudobulbar affect Depression  She does exhibit decline on MMSE.  Primarily, she has word-finding deficiencies.    PLAN: Plan is to transfer care to [facility] with a specialist, which I think is a good idea.  She has an appointment in 2 weeks. In the meantime, we will check a B12 level  Keep hydrated.  29 minutes spent face to face with patient, over 50% spent discussing diagnosis and management.  Metta Clines, DO  CC:  Garnet Koyanagi, DO

## 2015-05-14 ENCOUNTER — Telehealth: Payer: Self-pay

## 2015-05-14 NOTE — Telephone Encounter (Signed)
Left message on machine for pt to return call to the office.  

## 2015-05-14 NOTE — Telephone Encounter (Signed)
-----   Message from Drema Dallas, DO sent at 05/12/2015 10:01 AM EST ----- B12 level is normal

## 2015-05-17 NOTE — Telephone Encounter (Signed)
Error

## 2015-05-21 ENCOUNTER — Telehealth: Payer: Self-pay | Admitting: Family Medicine

## 2015-05-21 MED ORDER — LEVOTHYROXINE SODIUM 75 MCG PO TABS: 75.0000 ug | ORAL_TABLET | Freq: Every day | ORAL | Status: DC

## 2015-05-21 NOTE — Telephone Encounter (Signed)
Faxed.   KP 

## 2015-05-21 NOTE — Telephone Encounter (Signed)
Pharmacy: [facility]  Reason for call: Pt needs levothyroxine. Has 10 days on hand.

## 2015-05-23 ENCOUNTER — Other Ambulatory Visit: Payer: Self-pay | Admitting: Family Medicine

## 2015-05-23 ENCOUNTER — Other Ambulatory Visit: Payer: Self-pay | Admitting: Neurology

## 2015-05-23 NOTE — Telephone Encounter (Signed)
Medication filled to pharmacy as requested.   

## 2015-05-23 NOTE — Telephone Encounter (Signed)
Last OV: 05/11/15 Next OV: 0/0/0

## 2015-05-25 ENCOUNTER — Encounter: Payer: Self-pay | Admitting: Family Medicine

## 2015-05-25 DIAGNOSIS — F0391 Unspecified dementia with behavioral disturbance: Secondary | ICD-10-CM | POA: Diagnosis not present

## 2015-06-11 IMAGING — CR DG HIP W/ PELVIS BILAT
5 series · 5 of 5 positions shown · non-contrast
Comparison: None.

CLINICAL DATA: Pain after fall.

EXAM:
BILATERAL HIP WITH PELVIS - 4+ VIEW

[t pelvis a.p.]
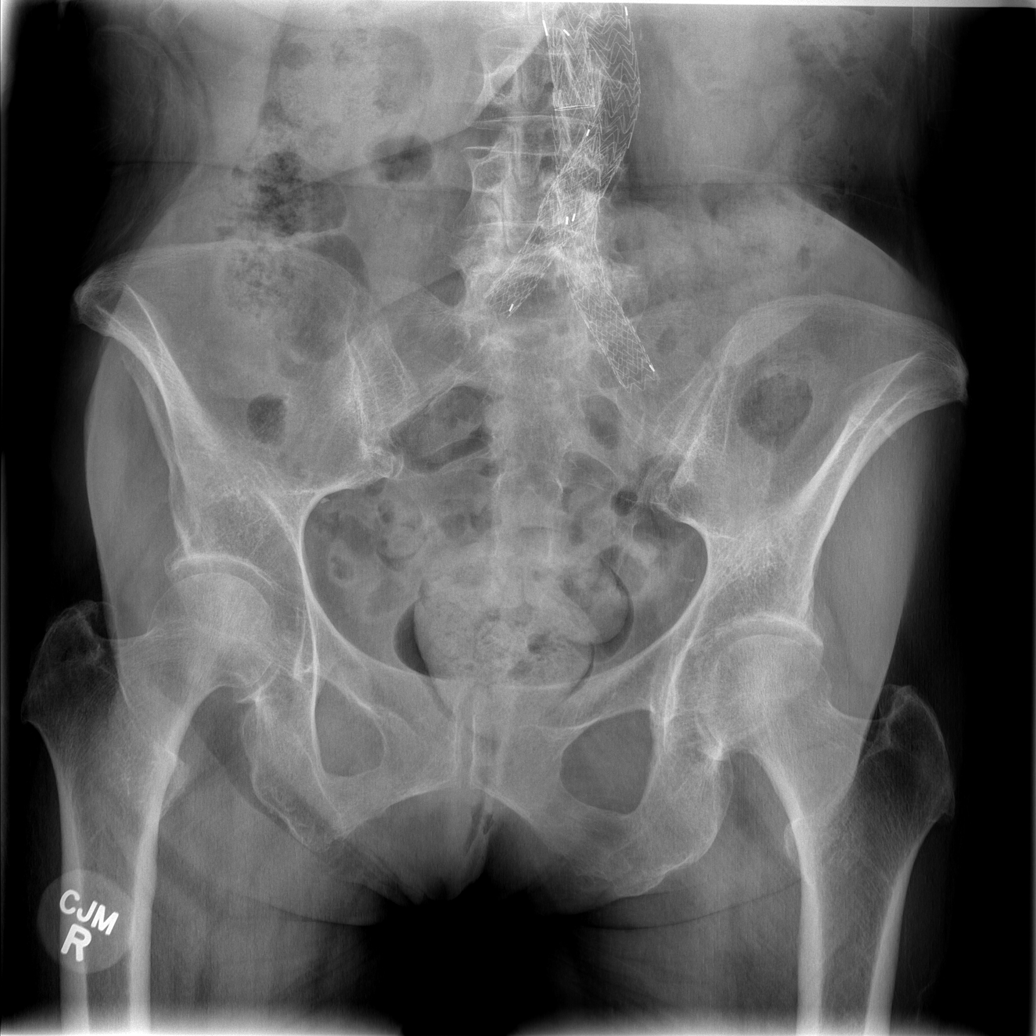

[t hip ap left]
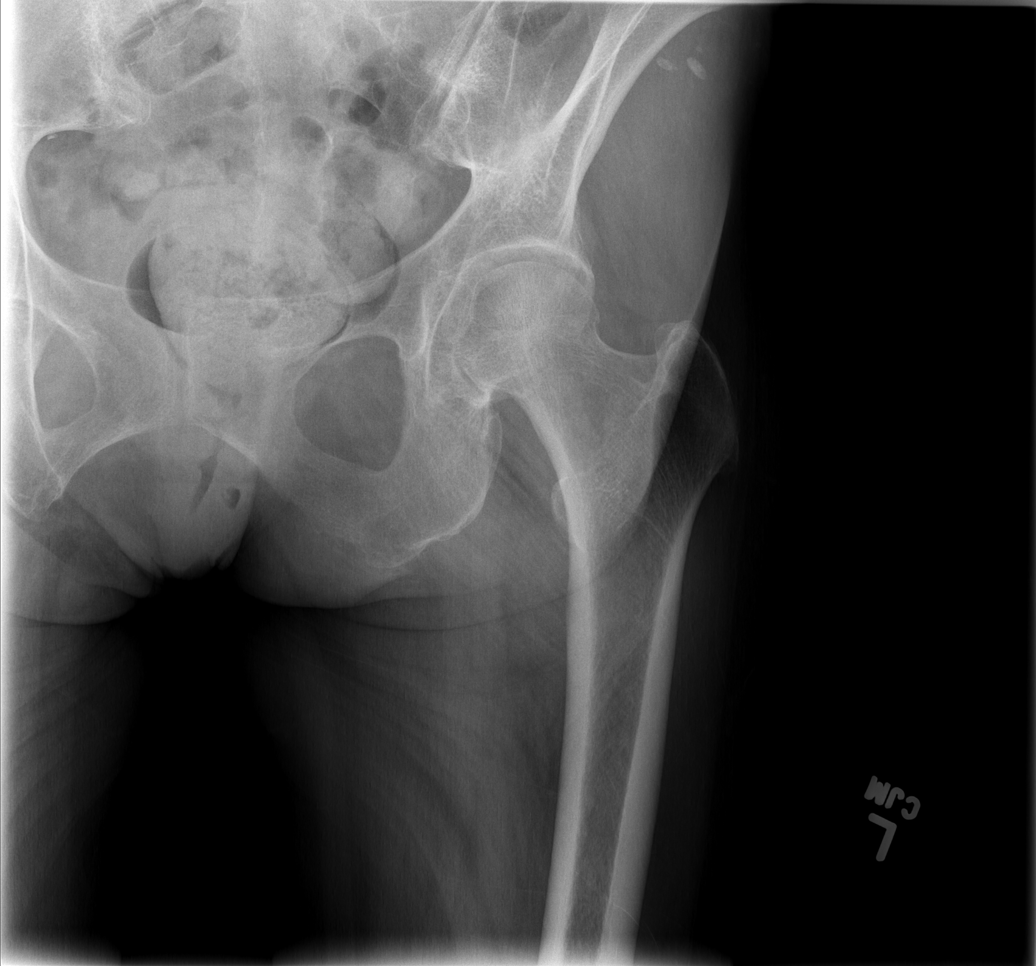

[t hip frog leg left]
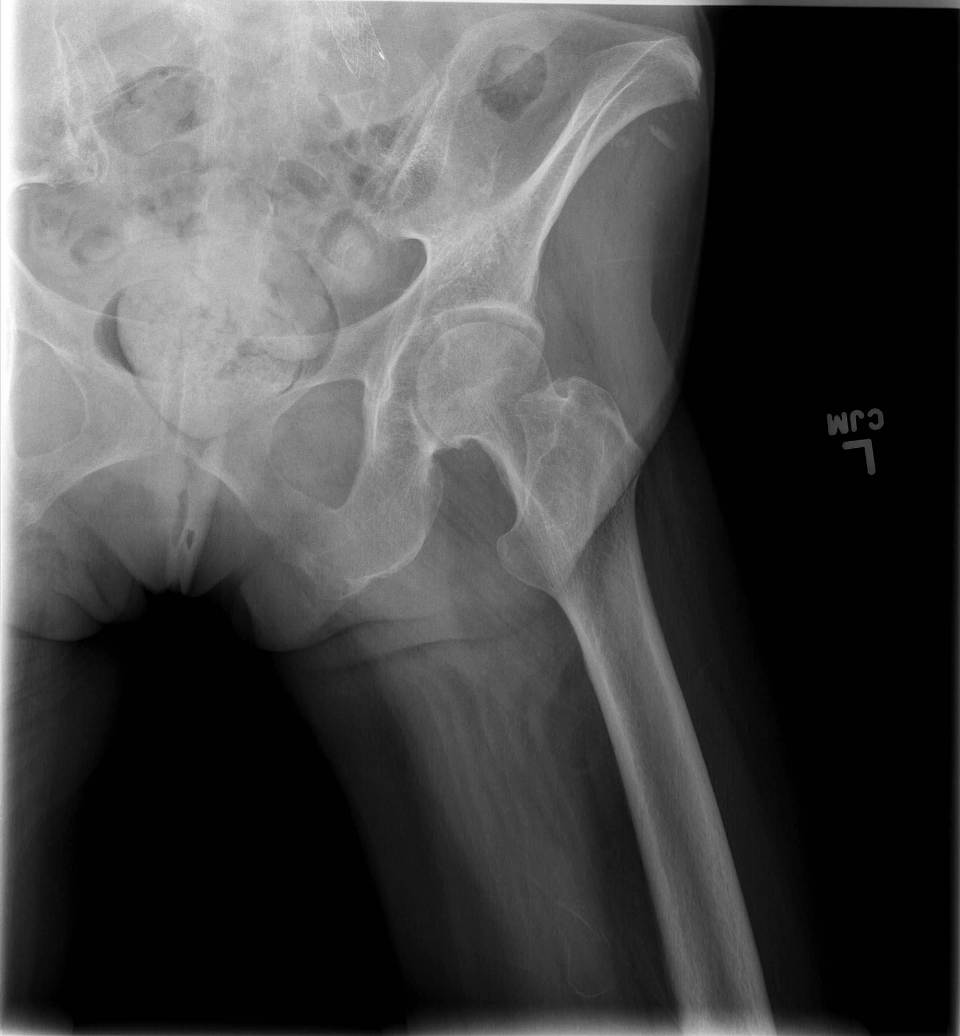

[t hip ap right]
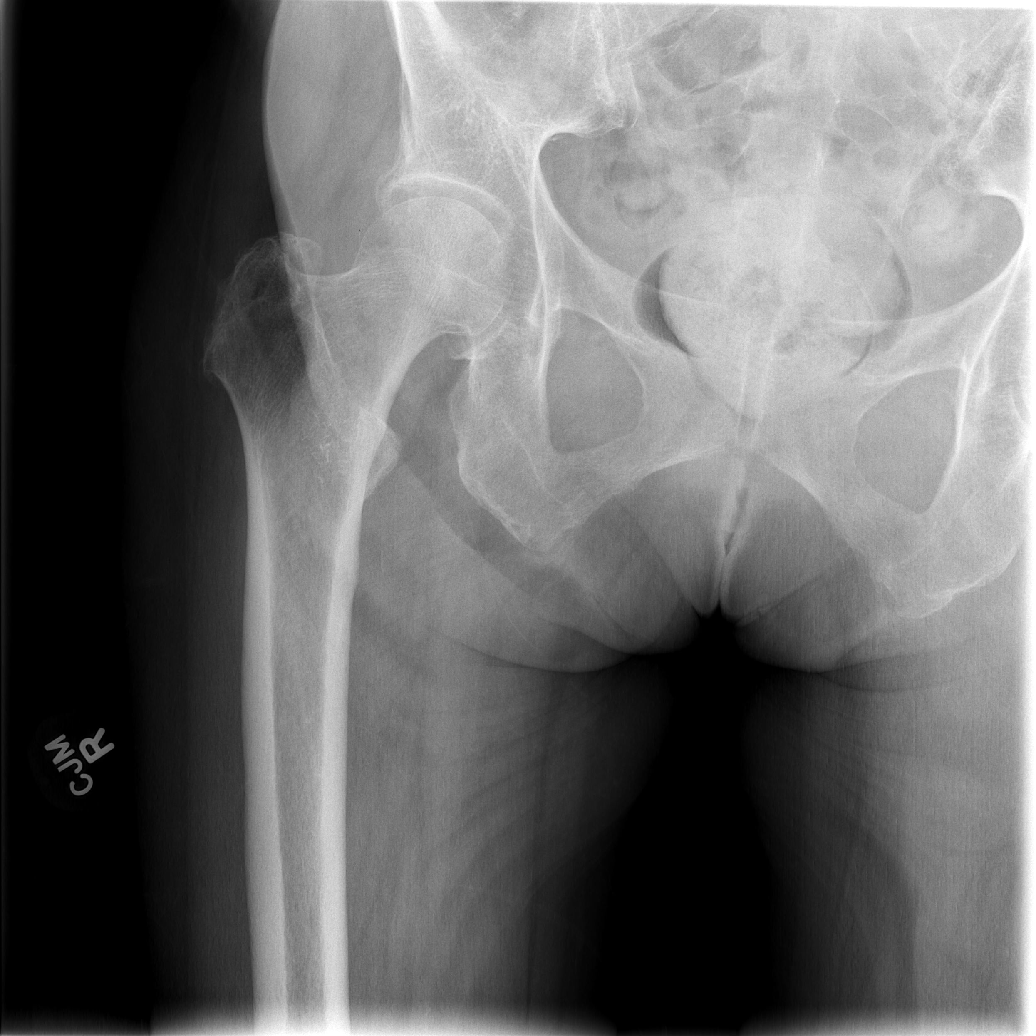

[t hip frog leg right]
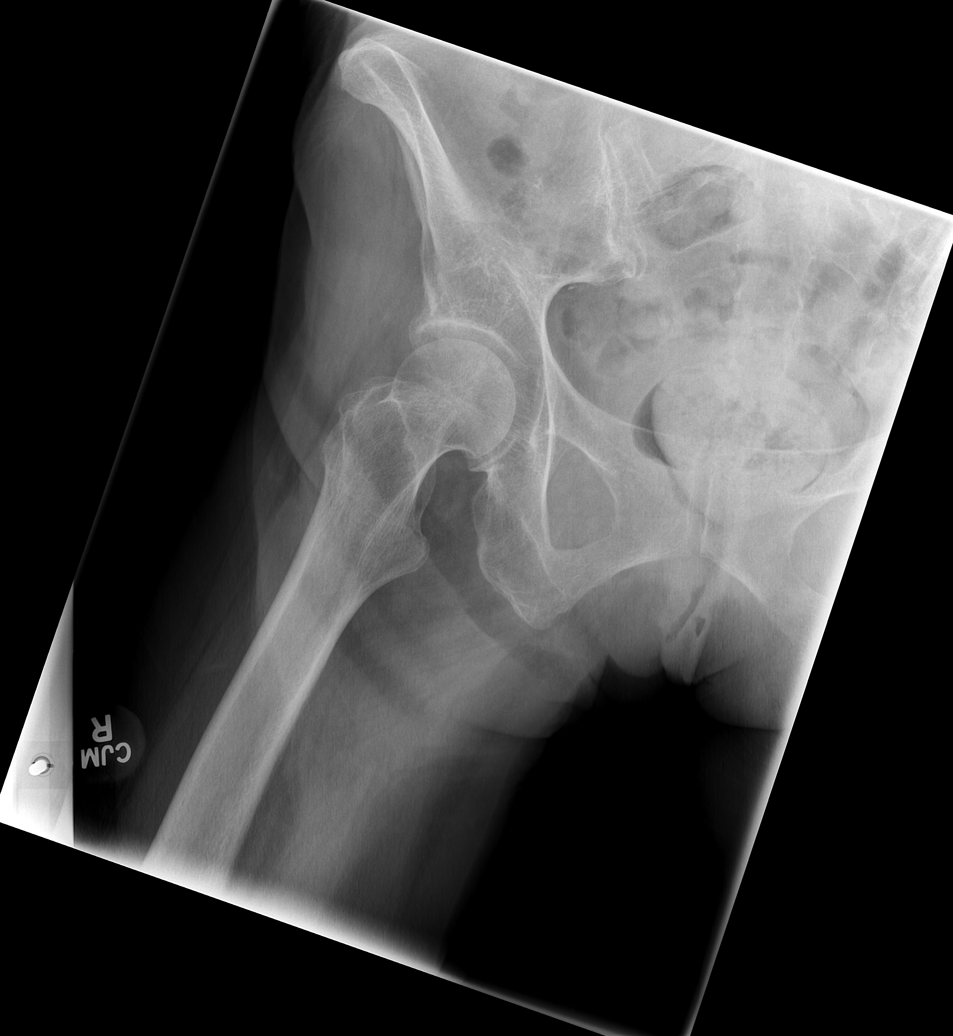

[5 of 5 positions shown; findings below may reference images not displayed]

FINDINGS: The hip and sacroiliac joints appear normal. No fracture or
dislocation is seen.
IMPRESSION: Normal pelvis.

## 2015-06-12 ENCOUNTER — Telehealth: Payer: Self-pay | Admitting: Family Medicine

## 2015-06-12 NOTE — Telephone Encounter (Signed)
Error

## 2015-06-19 DIAGNOSIS — I671 Cerebral aneurysm, nonruptured: Secondary | ICD-10-CM | POA: Diagnosis not present

## 2015-07-06 ENCOUNTER — Other Ambulatory Visit: Payer: Self-pay | Admitting: Family Medicine

## 2015-07-06 ENCOUNTER — Encounter (HOSPITAL_BASED_OUTPATIENT_CLINIC_OR_DEPARTMENT_OTHER): Payer: Self-pay | Admitting: *Deleted

## 2015-07-06 ENCOUNTER — Emergency Department (HOSPITAL_BASED_OUTPATIENT_CLINIC_OR_DEPARTMENT_OTHER)
Admission: EM | Admit: 2015-07-06 | Discharge: 2015-07-06 | Disposition: A | Discharge status: Home / Self Care | Payer: Commercial Managed Care - HMO | Attending: Emergency Medicine | Admitting: Emergency Medicine

## 2015-07-06 DIAGNOSIS — J45909 Unspecified asthma, uncomplicated: Secondary | ICD-10-CM | POA: Insufficient documentation

## 2015-07-06 DIAGNOSIS — E039 Hypothyroidism, unspecified: Secondary | ICD-10-CM | POA: Insufficient documentation

## 2015-07-06 DIAGNOSIS — F0280 Dementia in other diseases classified elsewhere without behavioral disturbance: Secondary | ICD-10-CM

## 2015-07-06 DIAGNOSIS — J449 Chronic obstructive pulmonary disease, unspecified: Secondary | ICD-10-CM | POA: Insufficient documentation

## 2015-07-06 DIAGNOSIS — F329 Major depressive disorder, single episode, unspecified: Secondary | ICD-10-CM | POA: Diagnosis not present

## 2015-07-06 DIAGNOSIS — Z79899 Other long term (current) drug therapy: Secondary | ICD-10-CM | POA: Diagnosis not present

## 2015-07-06 DIAGNOSIS — E854 Organ-limited amyloidosis: Secondary | ICD-10-CM

## 2015-07-06 DIAGNOSIS — R197 Diarrhea, unspecified: Secondary | ICD-10-CM | POA: Diagnosis not present

## 2015-07-06 DIAGNOSIS — Z87891 Personal history of nicotine dependence: Secondary | ICD-10-CM | POA: Insufficient documentation

## 2015-07-06 DIAGNOSIS — I680 Cerebral amyloid angiopathy: Secondary | ICD-10-CM

## 2015-07-06 DIAGNOSIS — I739 Peripheral vascular disease, unspecified: Secondary | ICD-10-CM | POA: Diagnosis not present

## 2015-07-06 DIAGNOSIS — E785 Hyperlipidemia, unspecified: Secondary | ICD-10-CM | POA: Diagnosis not present

## 2015-07-06 DIAGNOSIS — G3183 Dementia with Lewy bodies: Principal | ICD-10-CM

## 2015-07-06 LAB — CBC WITH DIFFERENTIAL/PLATELET
BASOS PCT: 1 %
Basophils Absolute: 0 10*3/uL (ref 0.0–0.1)
EOS ABS: 0.2 10*3/uL (ref 0.0–0.7)
EOS PCT: 3 %
HCT: 46.2 % — ABNORMAL HIGH (ref 36.0–46.0)
Hemoglobin: 15.9 g/dL — ABNORMAL HIGH (ref 12.0–15.0)
LYMPHS ABS: 1.9 10*3/uL (ref 0.7–4.0)
Lymphocytes Relative: 32 %
MCH: 30.7 pg (ref 26.0–34.0)
MCHC: 34.4 g/dL (ref 30.0–36.0)
MCV: 89.2 fL (ref 78.0–100.0)
Monocytes Absolute: 0.5 10*3/uL (ref 0.1–1.0)
Monocytes Relative: 9 %
Neutro Abs: 3.3 10*3/uL (ref 1.7–7.7)
Neutrophils Relative %: 56 %
PLATELETS: 139 10*3/uL — AB (ref 150–400)
RBC: 5.18 MIL/uL — AB (ref 3.87–5.11)
RDW: 13 % (ref 11.5–15.5)
WBC: 6 10*3/uL (ref 4.0–10.5)

## 2015-07-06 LAB — BASIC METABOLIC PANEL
ANION GAP: 10 (ref 5–15)
BUN: 23 mg/dL — AB (ref 6–20)
CALCIUM: 9.6 mg/dL (ref 8.9–10.3)
CO2: 17 mmol/L — ABNORMAL LOW (ref 22–32)
Chloride: 111 mmol/L (ref 101–111)
Creatinine, Ser: 1.15 mg/dL — ABNORMAL HIGH (ref 0.44–1.00)
GFR calc Af Amer: 55 mL/min — ABNORMAL LOW (ref >60)
GFR, EST NON AFRICAN AMERICAN: 48 mL/min — AB (ref >60)
GLUCOSE: 83 mg/dL (ref 65–99)
Potassium: 4.5 mmol/L (ref 3.5–5.1)
SODIUM: 138 mmol/L (ref 135–145)

## 2015-07-06 MED ORDER — SODIUM CHLORIDE 0.9 % IV BOLUS (SEPSIS)
500.0000 mL | Freq: Once | INTRAVENOUS | Status: AC
  Administered 2015-07-06: 500 mL via INTRAVENOUS

## 2015-07-06 MED ORDER — METHYLCELLULOSE (LAXATIVE) PO POWD: 1.0000 | Freq: Every day | ORAL | Status: AC

## 2015-07-06 NOTE — ED Notes (Signed)
Pt made aware to return if symptoms worsen or if any life threatening symptoms occur.   

## 2015-07-06 NOTE — ED Provider Notes (Signed)
CSN: [id number]     Arrival date & time 07/06/15  0935 History   First MD Initiated Contact with Patient 07/06/15 0945     Chief Complaint  Patient presents with  . Diarrhea   HPI Patient presents to the emergency room for evaluation of diarrhea that has started almost a month ago. Patient's symptoms have persisted. She is having approximately 3-4 episodes of diarrhea daily. Diarrhea is without blood. She has not had trouble with vomiting. She has not had trouble with abdominal pain. She has not been on antibiotics recently. She has been trying over-the-counter antidiarrheal agents without relief.  Patient has a past had trouble with constipation. Patient has a history of dementia associated with PRES and cerebral amyloid angiopathy.  Husband found her trying to take her laxative instead of her antidiarrheal agent this am.   Past Medical History  Diagnosis Date  . Arthritis   . Allergic rhinitis   . Asthma   . Depression   . GERD (gastroesophageal reflux disease)   . Hyperlipidemia   . Urinary incontinence   . History of chicken pox   . COPD (chronic obstructive pulmonary disease) (HCC)   . Pneumonia   . Chest pain     [facility]  . Hypothyroid   . PRES (posterior reversible encephalopathy syndrome)   . Constipation   . Peripheral vascular disease (HCC)   . Chronic kidney disease    Past Surgical History  Procedure Laterality Date  . Tubal ligation    . Tonsillectomy    . Knee surgery      right  . Endovascular stent insertion N/A 01/11/2013    Procedure: ENDOVASCULAR STENT GRAFT INSERTION; ULTRASOUND GUIDED, Renal Stent left renal artery , open left brachial artery closure.;  Surgeon: Nada LibmanVance W Brabham, MD;  Location: [facility] OR;  Service: Vascular;  Laterality: N/A;  . Abdominal aortic aneurysm repair    . Joint replacement     Family History  Problem Relation Age of Onset  . Coronary artery disease Mother   . Stroke Mother   . Heart disease Mother     before age 69  .  Hypertension Neg Hx   . Diabetes Neg Hx   . Cancer Neg Hx   . Multiple sclerosis Sister   . AAA (abdominal aortic aneurysm) Sister   . Heart disease Father    Social History  Substance Use Topics  . Smoking status: Former Smoker    Start date: 01/11/2013  . Smokeless tobacco: Never Used  . Alcohol Use: No   OB History    Gravida Para Term Preterm AB TAB SAB Ectopic Multiple Living   3 3             Review of Systems  All other systems reviewed and are negative.     Allergies  Ibuprofen  Home Medications   Prior to Admission medications   Medication Sig Start Date End Date Taking? Authorizing Provider  acetaminophen (TYLENOL) 500 MG tablet Take 500 mg by mouth every 6 (six) hours as needed.   Yes Historical Provider, MD  albuterol (PROVENTIL HFA;VENTOLIN HFA) 108 (90 Base) MCG/ACT inhaler Inhale into the lungs every 6 (six) hours as needed for wheezing or shortness of breath.   Yes Historical Provider, MD  alendronate (FOSAMAX) 70 MG tablet TAKE 1 TABLET BY MOUTH WITH A FULL GLASS OF WATER ON AN EMPTY STOMACH EVERY 7 DAYS. 04/12/15  Yes Yvonne R Lowne Chase, DO  atorvastatin (LIPITOR) 40 MG tablet TAKE  1 TABLET EVERY DAY 05/23/15  Yes Yvonne R Lowne Chase, DO  Calcium Carbonate-Vitamin D (CALCIUM-D PO) Take 600-800 mg by mouth daily.   Yes Historical Provider, MD  fenofibrate 160 MG tablet Take 1 tablet (160 mg total) by mouth daily. 11/30/14  Yes Sheliah Hatch, MD  levothyroxine (SYNTHROID, LEVOTHROID) 75 MCG tablet Take 1 tablet (75 mcg total) by mouth daily. 05/21/15  Yes Yvonne R Lowne Chase, DO  loratadine (ALLERGY RELIEF) 10 MG tablet Take 10 mg by mouth daily.   Yes Historical Provider, MD  pantoprazole (PROTONIX) 40 MG tablet TAKE 1 TABLET EVERY DAY 04/23/15  Yes Yvonne R Lowne Chase, DO  PARoxetine (PAXIL) 10 MG tablet Take 10 mg by mouth daily.   Yes Historical Provider, MD  polyethylene glycol (MIRALAX / GLYCOLAX) packet Take 17 g by mouth daily.    Yes Historical  Provider, MD  PROAIR HFA 108 [phone] Base) MCG/ACT inhaler INHALE 2 PUFFS BY MOUTH EVERY 6 HOURS AS NEEDED FOR WHEEZING 05/04/15  Yes Yvonne R Lowne Chase, DO  methylcellulose (CITRUCEL) oral powder Take 1 packet by mouth daily. Dissolve 1 heaping tablespoon  in 8 oz of cold water 07/06/15   Linwood Dibbles, MD  neomycin-bacitracin-polymyxin (NEOSPORIN) [phone] ointment Apply topically 4 (four) times daily.    Historical Provider, MD  sertraline (ZOLOFT) 100 MG tablet Take 100 mg by mouth daily.  01/05/15   Historical Provider, MD  sertraline (ZOLOFT) 25 MG tablet TAKE 3 TABLETS (75 MG TOTAL) BY MOUTH AT BEDTIME. 05/23/15   Drema Dallas, DO  tiotropium (SPIRIVA) 18 MCG inhalation capsule Place 18 mcg into inhaler and inhale as needed. Reported on 04/11/2015    Historical Provider, MD   BP 140/61 mmHg  Pulse 64  Temp(Src) 98.4 F (36.9 C) (Oral)  Resp 18  Ht  (1.727 m)  Wt 62.596 kg  BMI 20.99 kg/m2  SpO2 98% Physical Exam  Constitutional: She appears well-developed and well-nourished. No distress.  HENT:  Head: Normocephalic and atraumatic.  Right Ear: External ear normal.  Left Ear: External ear normal.  Eyes: Conjunctivae are normal. Right eye exhibits no discharge. Left eye exhibits no discharge. No scleral icterus.  Neck: Neck supple. No tracheal deviation present.  Cardiovascular: Normal rate, regular rhythm and intact distal pulses.   Pulmonary/Chest: Effort normal and breath sounds normal. No stridor. No respiratory distress. She has no wheezes. She has no rales.  Abdominal: Soft. Bowel sounds are normal. She exhibits no distension. There is no tenderness. There is no rebound and no guarding.  Genitourinary:  Firm yellow brown clay consistency stool in the rectal vault  Musculoskeletal: She exhibits no edema or tenderness.  Neurological: She is alert. She has normal strength. No cranial nerve deficit (no facial droop, extraocular movements intact, no slurred speech) or sensory deficit.  She exhibits normal muscle tone. She displays no seizure activity. Coordination normal.  Skin: Skin is warm and dry. No rash noted.  Psychiatric: She has a normal mood and affect.  Nursing note and vitals reviewed.   ED Course  Procedures (including critical care time) Labs Review Labs Reviewed  CBC WITH DIFFERENTIAL/PLATELET - Abnormal; Notable for the following:    RBC 5.18 (*)    Hemoglobin 15.9 (*)    HCT 46.2 (*)    Platelets 139 (*)    All other components within normal limits  BASIC METABOLIC PANEL - Abnormal; Notable for the following:    CO2 17 (*)    BUN 23 (*)  Creatinine, Ser 1.15 (*)    GFR calc non Af Amer 48 (*)    GFR calc Af Amer 55 (*)    All other components within normal limits  GASTROINTESTINAL PANEL BY PCR, STOOL (REPLACES STOOL CULTURE)   I have personally reviewed and evaluated these images and lab results as part of my medical decision-making.   MDM   Final diagnoses:  Overflow diarrhea   Bicarb decreased with increased BUN, stable creatinine.  Doubt this is clinically significant.  Will have her follow up with PCP.   I suspect her loose stools are related to overflow diarrhea.  She does have firm stool in the rectal vault.  Continue miralax.  Stop antidiarrhea agents.  Increase fiber intake.   Add metamucil.   Linwood Dibbles, MD 07/06/15 [phone]

## 2015-07-06 NOTE — ED Notes (Addendum)
Spouse of patient states the patient has had diarrhea for one month.  Has uses otc antidiarrhea meds with no relief.  States today she took miralax.  States for a "very long" time, the patient has gone from having chronic constipation to have chronic diarrhea for the last several years.

## 2015-07-06 NOTE — Discharge Instructions (Signed)

## 2015-07-07 LAB — GASTROINTESTINAL PANEL BY PCR, STOOL (REPLACES STOOL CULTURE)
Adenovirus F40/41: NOT DETECTED
Astrovirus: NOT DETECTED
CAMPYLOBACTER SPECIES: NOT DETECTED
CRYPTOSPORIDIUM: NOT DETECTED
CYCLOSPORA CAYETANENSIS: NOT DETECTED
E. COLI O157: NOT DETECTED
ENTEROPATHOGENIC E COLI (EPEC): NOT DETECTED
ENTEROTOXIGENIC E COLI (ETEC): NOT DETECTED
Entamoeba histolytica: NOT DETECTED
Enteroaggregative E coli (EAEC): NOT DETECTED
Giardia lamblia: NOT DETECTED
Norovirus GI/GII: NOT DETECTED
PLESIMONAS SHIGELLOIDES: NOT DETECTED
ROTAVIRUS A: NOT DETECTED
SAPOVIRUS (I, II, IV, AND V): NOT DETECTED
SHIGA LIKE TOXIN PRODUCING E COLI (STEC): NOT DETECTED
Salmonella species: NOT DETECTED
Shigella/Enteroinvasive E coli (EIEC): NOT DETECTED
VIBRIO SPECIES: NOT DETECTED
Vibrio cholerae: NOT DETECTED
YERSINIA ENTEROCOLITICA: NOT DETECTED

## 2015-07-12 ENCOUNTER — Ambulatory Visit: Payer: Commercial Managed Care - HMO | Admitting: Family Medicine

## 2015-07-13 ENCOUNTER — Ambulatory Visit: Payer: Commercial Managed Care - HMO | Admitting: Family Medicine

## 2015-07-17 ENCOUNTER — Ambulatory Visit: Payer: Commercial Managed Care - HMO | Admitting: Family Medicine

## 2015-07-19 DIAGNOSIS — I671 Cerebral aneurysm, nonruptured: Secondary | ICD-10-CM | POA: Diagnosis not present

## 2015-07-23 ENCOUNTER — Encounter: Payer: Self-pay | Admitting: Family Medicine

## 2015-07-23 ENCOUNTER — Ambulatory Visit (INDEPENDENT_AMBULATORY_CARE_PROVIDER_SITE_OTHER): Payer: Commercial Managed Care - HMO | Admitting: Family Medicine

## 2015-07-23 ENCOUNTER — Ambulatory Visit (HOSPITAL_BASED_OUTPATIENT_CLINIC_OR_DEPARTMENT_OTHER)
Admission: RE | Admit: 2015-07-23 | Discharge: 2015-07-23 | Disposition: A | Discharge status: Home / Self Care | Payer: Commercial Managed Care - HMO | Source: Ambulatory Visit | Attending: Family Medicine | Admitting: Family Medicine

## 2015-07-23 VITALS — BP 140/82 | HR 74 | Temp 97.6°F | Ht 68.0 in | Wt 137.8 lb

## 2015-07-23 DIAGNOSIS — F329 Major depressive disorder, single episode, unspecified: Secondary | ICD-10-CM

## 2015-07-23 DIAGNOSIS — R197 Diarrhea, unspecified: Secondary | ICD-10-CM

## 2015-07-23 DIAGNOSIS — F32A Depression, unspecified: Secondary | ICD-10-CM

## 2015-07-23 LAB — CBC WITH DIFFERENTIAL/PLATELET
BASOS PCT: 0.5 % (ref 0.0–3.0)
Basophils Absolute: 0 10*3/uL (ref 0.0–0.1)
Eosinophils Absolute: 0.1 10*3/uL (ref 0.0–0.7)
Eosinophils Relative: 2 % (ref 0.0–5.0)
HEMATOCRIT: 39.2 % (ref 36.0–46.0)
Hemoglobin: 13.1 g/dL (ref 12.0–15.0)
LYMPHS PCT: 32.3 % (ref 12.0–46.0)
Lymphs Abs: 2.2 10*3/uL (ref 0.7–4.0)
MCHC: 33.5 g/dL (ref 30.0–36.0)
MCV: 90.1 fl (ref 78.0–100.0)
MONOS PCT: 7.6 % (ref 3.0–12.0)
Monocytes Absolute: 0.5 10*3/uL (ref 0.1–1.0)
NEUTROS ABS: 4 10*3/uL (ref 1.4–7.7)
Neutrophils Relative %: 57.6 % (ref 43.0–77.0)
PLATELETS: 240 10*3/uL (ref 150.0–400.0)
RBC: 4.35 Mil/uL (ref 3.87–5.11)
RDW: 13.6 % (ref 11.5–15.5)
WBC: 6.9 10*3/uL (ref 4.0–10.5)

## 2015-07-23 LAB — COMPREHENSIVE METABOLIC PANEL
ALT: 15 U/L (ref 0–35)
AST: 31 U/L (ref 0–37)
Albumin: 4.1 g/dL (ref 3.5–5.2)
Alkaline Phosphatase: 27 U/L — ABNORMAL LOW (ref 39–117)
BUN: 24 mg/dL — ABNORMAL HIGH (ref 6–23)
CALCIUM: 10 mg/dL (ref 8.4–10.5)
CHLORIDE: 103 meq/L (ref 96–112)
CO2: 27 meq/L (ref 19–32)
Creatinine, Ser: 1.17 mg/dL (ref 0.40–1.20)
GFR: 48.74 mL/min — AB (ref >60.00)
GLUCOSE: 90 mg/dL (ref 70–99)
Potassium: 4.2 mEq/L (ref 3.5–5.1)
Sodium: 137 mEq/L (ref 135–145)
Total Bilirubin: 0.5 mg/dL (ref 0.2–1.2)
Total Protein: 6.8 g/dL (ref 6.0–8.3)

## 2015-07-23 MED ORDER — PAROXETINE HCL 20 MG PO TABS: 20.0000 mg | ORAL_TABLET | Freq: Every day | ORAL | Status: DC

## 2015-07-23 NOTE — Progress Notes (Signed)
Pre visit review using our clinic review tool, if applicable. No additional management support is needed unless otherwise documented below in the visit note. 

## 2015-07-23 NOTE — Progress Notes (Signed)
Subjective:    Patient ID: Amy Bates, female    DOB: 12/05/1945, 70 y.o.   MRN: [id number]  Hypertension Pertinent negatives include no chest pain, headaches, palpitations or shortness of breath.  Diarrhea  Pertinent negatives include no abdominal pain, coughing, headaches or vomiting.    Patient here for c/o diarrhea x several weeks.  She usually has constipation.  She went to er with her husband the end of last month and she was told she had overflow diarrhea and to use miralax and increase fiber in her diet.   It worked some but then started back and worsened.  No fevers.  No abd pain.   No NV  Past Medical History  Diagnosis Date  . Arthritis   . Allergic rhinitis   . Asthma   . Depression   . GERD (gastroesophageal reflux disease)   . Hyperlipidemia   . Urinary incontinence   . History of chicken pox   . COPD (chronic obstructive pulmonary disease) (HCC)   . Pneumonia   . Chest pain     [facility]  . Hypothyroid   . PRES (posterior reversible encephalopathy syndrome)   . Constipation   . Peripheral vascular disease (HCC)   . Chronic kidney disease     Review of Systems  Constitutional: Negative for diaphoresis, appetite change, fatigue and unexpected weight change.  Eyes: Negative for pain, redness and visual disturbance.  Respiratory: Negative for cough, chest tightness, shortness of breath and wheezing.   Cardiovascular: Negative for chest pain, palpitations and leg swelling.  Gastrointestinal: Positive for diarrhea and constipation. Negative for nausea, vomiting, abdominal pain, blood in stool, abdominal distention, anal bleeding and rectal pain.  Endocrine: Negative for cold intolerance, heat intolerance, polydipsia, polyphagia and polyuria.  Genitourinary: Negative for dysuria, frequency and difficulty urinating.  Neurological: Negative for dizziness, light-headedness, numbness and headaches.       Objective:    Physical Exam  Constitutional: She  is oriented to person, place, and time. She appears well-developed and well-nourished.  HENT:  Head: Normocephalic and atraumatic.  Eyes: Conjunctivae and EOM are normal.  Neck: Normal range of motion. Neck supple. No JVD present. Carotid bruit is not present. No thyromegaly present.  Cardiovascular: Normal rate, regular rhythm and normal heart sounds.   No murmur heard. Pulmonary/Chest: Effort normal and breath sounds normal. No respiratory distress. She has no wheezes. She has no rales. She exhibits no tenderness.  Abdominal: Soft. Bowel sounds are normal. She exhibits no distension and no mass. There is no tenderness. There is no rebound and no guarding.  Musculoskeletal: She exhibits no edema.  Neurological: She is alert and oriented to person, place, and time.  Psychiatric: She has a normal mood and affect. Her behavior is normal. Judgment and thought content normal.  Nursing note and vitals reviewed.   BP 140/82 mmHg  Pulse 74  Temp(Src) 97.6 F (36.4 C) (Oral)  Ht 5\' 8"  (1.727 m)  Wt 137 lb 12.8 oz (62.506 kg)  BMI 20.96 kg/m2  SpO2 97% Wt Readings from Last 3 Encounters:  07/23/15 137 lb 12.8 oz (62.506 kg)  07/06/15 138 lb (62.596 kg)  05/11/15 139 lb 11.2 oz (63.368 kg)     Lab Results  Component Value Date   WBC 6.0 07/06/2015   HGB 15.9* 07/06/2015   HCT 46.2* 07/06/2015   PLT 139* 07/06/2015   GLUCOSE 83 07/06/2015   CHOL 123 04/12/2015   TRIG 85.0 04/12/2015   HDL 43.70 04/12/2015  LDLDIRECT 153.9 01/31/2013   LDLCALC 62 04/12/2015   ALT 18 04/12/2015   AST 35 04/12/2015   NA 138 07/06/2015   K 4.5 07/06/2015   CL 111 07/06/2015   CREATININE 1.15* 07/06/2015   BUN 23* 07/06/2015   CO2 17* 07/06/2015   TSH 2.43 04/12/2015   INR 0.99 05/26/2013   MICROALBUR 1.1 04/12/2015    No results found.     Assessment & Plan:   Problem List Items Addressed This Visit      Unprioritized   Depression - Primary   Relevant Medications   PARoxetine  (PAXIL) 20 MG tablet    Other Visit Diagnoses    Diarrhea, unspecified type        Relevant Orders    DG Abd 2 Views    Comprehensive metabolic panel    CBC with Differential/Platelet    Stool culture     will check xray first-- if she is constipated---  We will restart miralax and stool softener.     Donato Schultz, DO

## 2015-07-23 NOTE — Addendum Note (Signed)
Addended by: Erin HearingHEEK, Maison Agrusa N on: 07/23/2015 02:17 PM   Modules accepted: SmartSet

## 2015-07-23 NOTE — Patient Instructions (Signed)

## 2015-07-25 ENCOUNTER — Telehealth: Payer: Self-pay

## 2015-07-25 NOTE — Telephone Encounter (Signed)
Notes Recorded by Donato SchultzYvonne R Lowne Chase, DO on 07/23/2015 at 5:15 PM + constipation--- no impaction Go back to miralax daily We can refer to GI if symptoms persist   I spoke with Caryn BeeKevin and he made him aware of the results and he verbalized understanding, he said he will give her the Miralax daily. He stated that he will give he will give her the MIralax and I made him aware if she starts to have blood in her stool, severe pain or fevers to let us know. Her verbalized understanding and has agreed to do so.     KP

## 2015-07-26 ENCOUNTER — Telehealth: Payer: Self-pay | Admitting: *Deleted

## 2015-07-26 NOTE — Telephone Encounter (Signed)
Can they tell us what she did wrong so we know what to tell them.?

## 2015-07-26 NOTE — Telephone Encounter (Signed)
[facility] reporting that patients IFOB was done incorrectly . Recollect.

## 2015-07-27 ENCOUNTER — Other Ambulatory Visit: Payer: Self-pay | Admitting: *Deleted

## 2015-07-27 DIAGNOSIS — R197 Diarrhea, unspecified: Secondary | ICD-10-CM

## 2015-07-27 NOTE — Telephone Encounter (Signed)
Pt's husband notified of below and need to recollect. New IFOB kit placed at front desk for pick up and orders entered.

## 2015-07-27 NOTE — Telephone Encounter (Signed)
Based on the [facility] .Marland Kitchen.Marland Kitchen.Apparently patient smeared too much all over the card as if it was a slice of butter bread.... Just need little amount in designated area..Marland Kitchen

## 2015-07-30 ENCOUNTER — Other Ambulatory Visit (INDEPENDENT_AMBULATORY_CARE_PROVIDER_SITE_OTHER): Payer: Commercial Managed Care - HMO

## 2015-07-30 DIAGNOSIS — R197 Diarrhea, unspecified: Secondary | ICD-10-CM | POA: Diagnosis not present

## 2015-07-30 LAB — FECAL OCCULT BLOOD, IMMUNOCHEMICAL: FECAL OCCULT BLD: NEGATIVE

## 2015-08-01 ENCOUNTER — Other Ambulatory Visit: Payer: Self-pay | Admitting: Family Medicine

## 2015-08-02 DIAGNOSIS — R531 Weakness: Secondary | ICD-10-CM | POA: Diagnosis not present

## 2015-09-24 ENCOUNTER — Telehealth: Payer: Self-pay | Admitting: Family Medicine

## 2015-09-24 NOTE — Telephone Encounter (Signed)
Caller name: Amy  Relation to pt: referral specialist from [facility]  Call back number: [phone] Pharmacy:  Reason for call:  Requesting attending PCP to be attending physican and to activate hospice standing orders.

## 2015-09-24 NOTE — Telephone Encounter (Signed)
Please advise      KP 

## 2015-09-24 NOTE — Telephone Encounter (Signed)
Ok for verbal 

## 2015-09-24 NOTE — Telephone Encounter (Signed)
Verbal has been given to Hospice.     KP

## 2015-09-25 NOTE — Telephone Encounter (Signed)
Thank you :)

## 2015-09-25 NOTE — Telephone Encounter (Signed)
[facility] and he stated he went out to see the patient today and she is unchanged, said she is not eligible for Hospice at this time, the husband requested the services because he is at his wits end with caring for her, it is becoming more difficult. He will have the Nurse practitioner go to the home and they will get a SW referral in to see if they would get them some help.     KP

## 2015-09-26 ENCOUNTER — Telehealth: Payer: Self-pay | Admitting: Family Medicine

## 2015-09-26 DIAGNOSIS — R531 Weakness: Secondary | ICD-10-CM | POA: Diagnosis not present
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
## 2015-09-26 NOTE — Telephone Encounter (Signed)
Caller name: Sharia ReeveJosh, RN with [facility] Can be reached: [phone]  Reason for call: requesting call back from Dr. Laury AxonLowne or CMA to discuss caregiver issues.

## 2015-10-05 DIAGNOSIS — I680 Cerebral amyloid angiopathy: Secondary | ICD-10-CM | POA: Diagnosis not present

## 2015-10-05 DIAGNOSIS — I10 Essential (primary) hypertension: Secondary | ICD-10-CM | POA: Diagnosis not present

## 2015-10-05 DIAGNOSIS — R413 Other amnesia: Secondary | ICD-10-CM | POA: Diagnosis not present

## 2015-10-08 ENCOUNTER — Other Ambulatory Visit: Payer: Self-pay | Admitting: Family Medicine

## 2015-10-08 NOTE — Telephone Encounter (Signed)
Medication filled to pharmacy as requested.   

## 2015-10-13 ENCOUNTER — Emergency Department (HOSPITAL_COMMUNITY): Payer: Commercial Managed Care - HMO

## 2015-10-13 ENCOUNTER — Emergency Department (HOSPITAL_COMMUNITY)
Admission: EM | Admit: 2015-10-13 | Discharge: 2015-10-13 | Disposition: A | Discharge status: Home / Self Care | Payer: Commercial Managed Care - HMO | Attending: Emergency Medicine | Admitting: Emergency Medicine

## 2015-10-13 DIAGNOSIS — R519 Headache, unspecified: Secondary | ICD-10-CM

## 2015-10-13 DIAGNOSIS — Z96651 Presence of right artificial knee joint: Secondary | ICD-10-CM | POA: Insufficient documentation

## 2015-10-13 DIAGNOSIS — R51 Headache: Secondary | ICD-10-CM | POA: Insufficient documentation

## 2015-10-13 DIAGNOSIS — E039 Hypothyroidism, unspecified: Secondary | ICD-10-CM | POA: Insufficient documentation

## 2015-10-13 DIAGNOSIS — J439 Emphysema, unspecified: Secondary | ICD-10-CM | POA: Diagnosis not present

## 2015-10-13 DIAGNOSIS — I6602 Occlusion and stenosis of left middle cerebral artery: Secondary | ICD-10-CM | POA: Diagnosis not present

## 2015-10-13 DIAGNOSIS — Z79899 Other long term (current) drug therapy: Secondary | ICD-10-CM | POA: Diagnosis not present

## 2015-10-13 DIAGNOSIS — R410 Disorientation, unspecified: Secondary | ICD-10-CM | POA: Diagnosis not present

## 2015-10-13 DIAGNOSIS — J449 Chronic obstructive pulmonary disease, unspecified: Secondary | ICD-10-CM | POA: Insufficient documentation

## 2015-10-13 DIAGNOSIS — Z87891 Personal history of nicotine dependence: Secondary | ICD-10-CM | POA: Diagnosis not present

## 2015-10-13 DIAGNOSIS — N189 Chronic kidney disease, unspecified: Secondary | ICD-10-CM | POA: Diagnosis not present

## 2015-10-13 LAB — CBC
HEMATOCRIT: 43 % (ref 36.0–46.0)
Hemoglobin: 14.1 g/dL (ref 12.0–15.0)
MCH: 30 pg (ref 26.0–34.0)
MCHC: 32.8 g/dL (ref 30.0–36.0)
MCV: 91.5 fL (ref 78.0–100.0)
Platelets: UNDETERMINED 10*3/uL (ref 150–400)
RBC: 4.7 MIL/uL (ref 3.87–5.11)
RDW: 13 % (ref 11.5–15.5)
WBC: 4.7 10*3/uL (ref 4.0–10.5)

## 2015-10-13 LAB — COMPREHENSIVE METABOLIC PANEL
ALBUMIN: 4 g/dL (ref 3.5–5.0)
ALT: 15 U/L (ref 14–54)
AST: 39 U/L (ref 15–41)
Alkaline Phosphatase: 37 U/L — ABNORMAL LOW (ref 38–126)
Anion gap: 8 (ref 5–15)
BUN: 21 mg/dL — AB (ref 6–20)
CHLORIDE: 108 mmol/L (ref 101–111)
CO2: 23 mmol/L (ref 22–32)
CREATININE: 1.19 mg/dL — AB (ref 0.44–1.00)
Calcium: 9.9 mg/dL (ref 8.9–10.3)
GFR calc Af Amer: 53 mL/min — ABNORMAL LOW (ref >60)
GFR, EST NON AFRICAN AMERICAN: 45 mL/min — AB (ref >60)
Glucose, Bld: 103 mg/dL — ABNORMAL HIGH (ref 65–99)
POTASSIUM: 4.1 mmol/L (ref 3.5–5.1)
SODIUM: 139 mmol/L (ref 135–145)
Total Bilirubin: 0.7 mg/dL (ref 0.3–1.2)
Total Protein: 6.6 g/dL (ref 6.5–8.1)

## 2015-10-13 LAB — URINALYSIS, ROUTINE W REFLEX MICROSCOPIC
Bilirubin Urine: NEGATIVE
GLUCOSE, UA: NEGATIVE mg/dL
Hgb urine dipstick: NEGATIVE
KETONES UR: NEGATIVE mg/dL
LEUKOCYTES UA: NEGATIVE
Nitrite: NEGATIVE
PH: 6.5 (ref 5.0–8.0)
PROTEIN: NEGATIVE mg/dL
Specific Gravity, Urine: 1.029 (ref 1.005–1.030)

## 2015-10-13 LAB — LIPASE, BLOOD: LIPASE: 102 U/L — AB (ref 11–51)

## 2015-10-13 LAB — PROTIME-INR
INR: 1.03
Prothrombin Time: 13.5 seconds (ref 11.4–15.2)

## 2015-10-13 MED ORDER — IOPAMIDOL (ISOVUE-370) INJECTION 76%
INTRAVENOUS | Status: AC
  Administered 2015-10-13: 50 mL
  Filled 2015-10-13: qty 50

## 2015-10-13 MED ORDER — DIPHENHYDRAMINE HCL 50 MG/ML IJ SOLN
25.0000 mg | Freq: Once | INTRAMUSCULAR | Status: AC
  Administered 2015-10-13: 25 mg via INTRAVENOUS
  Filled 2015-10-13: qty 1

## 2015-10-13 MED ORDER — METOCLOPRAMIDE HCL 5 MG/ML IJ SOLN
10.0000 mg | Freq: Once | INTRAMUSCULAR | Status: AC
  Administered 2015-10-13: 10 mg via INTRAVENOUS
  Filled 2015-10-13: qty 2

## 2015-10-13 MED ORDER — ONDANSETRON HCL 4 MG/2ML IJ SOLN
4.0000 mg | Freq: Once | INTRAMUSCULAR | Status: AC | PRN
  Administered 2015-10-13: 4 mg via INTRAVENOUS
  Filled 2015-10-13: qty 2

## 2015-10-13 MED ORDER — MORPHINE SULFATE (PF) 2 MG/ML IV SOLN
2.0000 mg | Freq: Once | INTRAVENOUS | Status: DC
Start: 2015-10-13 — End: 2015-10-13
  Filled 2015-10-13: qty 1

## 2015-10-13 NOTE — ED Notes (Signed)
Pt ambulated w/ Gunnar Fusi, EMT w/o difficulty.

## 2015-10-13 NOTE — ED Notes (Signed)
Pt transported to CT ?

## 2015-10-13 NOTE — ED Provider Notes (Signed)
[facility]-EMERGENCY DEPT Provider Note   CSN: [id number] Arrival date & time: 10/13/15  0865  First Provider Contact:  First MD Initiated Contact with Patient 10/13/15 [phone]        History   Chief Complaint Chief Complaint  Patient presents with  . Migraine    HPI Amy Bates is a 70 y.o. female.  Level V caveat for dementia. Patient from home with headache that onset yesterday afternoon. Husband states she complained of a headache yesterday afternoon and he gave her Tylenol and subsequently improved. However she states the headache never really went away and was progressively worse this morning. Patient has some dementia at baseline she is oriented 2 which is her baseline. She is unclear how the headache started and cannot state whether with sudden onset versus gradual onset. Patient has a history of a known 3 mm MCA aneurysm. She saw neurosurgery about this and was told it did not need to be worried about.there is no focal weakness, numbness or tingling. She had one episode of vomiting this morning. 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   The history is provided by the patient and the spouse. The history is limited by the condition of the patient.  Migraine  Associated symptoms include headaches. Pertinent negatives include no chest pain, no abdominal pain and no shortness of breath.    Past Medical History:  Diagnosis Date  . Allergic rhinitis   . Arthritis   . Asthma   . Chest pain    [facility]  . Chronic kidney disease   . Constipation   . COPD (chronic obstructive pulmonary disease) (HCC)   . Depression   . GERD (gastroesophageal reflux disease)   . History of chicken pox   . Hyperlipidemia   . Hypothyroid   . Peripheral vascular disease (HCC)   . Pneumonia   . PRES (posterior reversible encephalopathy syndrome)   . Urinary incontinence     Patient Active Problem List   Diagnosis Date Noted  . Balloon like swelling of an artery  of the brain 07/19/2015  . Gingival and periodontal disease 08/02/2014  . 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 Anoxic brain damage (HCC) 11/07/2013  . Elevated BP 10/24/2013  . Slow transit constipation 09/12/2013  . Protein calorie malnutrition (HCC) 09/12/2013  . Dysmetria 08/11/2013  . Other reduced mobility 08/11/2013  . Bad memory 08/11/2013  . Injury brain, traumatic (HCC) 06/21/2013  . PRES (posterior reversible encephalopathy syndrome) 05/28/2013  . Seizures (HCC) 05/28/2013  . Fall at home 05/27/2013  . Altered mental status 05/27/2013  . 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 Headache(784.0) 03/01/2013  . Family history of brain aneurysm 03/01/2013  . Abdominal aneurysm without mention of rupture 02/07/2013  . AAA (abdominal aortic aneurysm) (HCC) 01/11/2013  . Abdominal pain, diffuse 01/10/2013  . Memory loss 01/10/2013  . COPD exacerbation (HCC) 06/01/2012  . Bronchitis 12/17/2010  . Insomnia 10/27/2010  . Tobacco use disorder 07/30/2010  . General medical examination 07/22/2010  . Weight loss 07/22/2010  . Hypothyroidism 03/26/2010  . Hyperlipidemia 03/21/2010  . Depression 03/21/2010  . ALLERGIC RHINITIS 03/21/2010  . ASTHMA 03/21/2010  . COPD (chronic obstructive pulmonary disease) with chronic bronchitis (HCC) 03/21/2010  . GERD 03/21/2010  . URINARY INCONTINENCE 03/21/2010    Past Surgical History:  Procedure Laterality Date  . ABDOMINAL AORTIC ANEURYSM REPAIR    . ENDOVASCULAR STENT INSERTION N/A 01/11/2013   Procedure: ENDOVASCULAR STENT GRAFT INSERTION; ULTRASOUND GUIDED, Renal Stent left  renal artery , open left brachial artery closure.;  Surgeon: Nada Libman, MD;  Location: [facility] OR;  Service: Vascular;  Laterality: N/A;  . JOINT REPLACEMENT    . KNEE SURGERY     right  . TONSILLECTOMY    . TUBAL LIGATION      OB History    Gravida Para Term Preterm AB Living   3 3           SAB TAB Ectopic Multiple Live Births                   Home  Medications    Prior to Admission medications   Medication Sig Start Date End Date Taking? Authorizing Provider  acetaminophen (TYLENOL) 500 MG tablet Take 500 mg by mouth every 6 (six) hours as needed.    Historical Provider, MD  albuterol (PROVENTIL HFA;VENTOLIN HFA) 108 (90 Base) MCG/ACT inhaler Inhale into the lungs every 6 (six) hours as needed for wheezing or shortness of breath.    Historical Provider, MD  alendronate (FOSAMAX) 70 MG tablet TAKE 1 TABLET BY MOUTH WITH A FULL GLASS OF WATER ON AN EMPTY STOMACH EVERY 7 DAYS. 04/12/15   Lelon Perla Chase, DO  atorvastatin (LIPITOR) 40 MG tablet TAKE 1 TABLET EVERY DAY 10/08/15   Lelon Perla Chase, DO  Calcium Carbonate-Vitamin D (CALCIUM-D PO) Take 600-800 mg by mouth daily.    Historical Provider, MD  fenofibrate 160 MG tablet TAKE 1 TABLET EVERY DAY 08/03/15   Lelon Perla Chase, DO  levothyroxine (SYNTHROID, LEVOTHROID) 75 MCG tablet Take 1 tablet (75 mcg total) by mouth daily. 05/21/15   Lelon Perla Chase, DO  loratadine (ALLERGY RELIEF) 10 MG tablet Take 10 mg by mouth daily.    Historical Provider, MD  methylcellulose (CITRUCEL) oral powder Take 1 packet by mouth daily. Dissolve 1 heaping tablespoon  in 8 oz of cold water 07/06/15   Linwood Dibbles, MD  neomycin-bacitracin-polymyxin (NEOSPORIN) [phone] ointment Apply topically 4 (four) times daily.    Historical Provider, MD  pantoprazole (PROTONIX) 40 MG tablet TAKE 1 TABLET EVERY DAY 04/23/15   Lelon Perla Chase, DO  PARoxetine (PAXIL) 20 MG tablet Take 1 tablet (20 mg total) by mouth daily. 07/23/15   Lelon Perla Chase, DO  polyethylene glycol (MIRALAX / GLYCOLAX) packet Take 17 g by mouth daily.     Historical Provider, MD    Family History Family History  Problem Relation Age of Onset  . Coronary artery disease Mother   . Stroke Mother   . Heart disease Mother     before age 35  . Hypertension Neg Hx   . Diabetes Neg Hx   . Cancer Neg Hx   . Multiple sclerosis Sister     . AAA (abdominal aortic aneurysm) Sister   . Heart disease Father     Social History Social History  Substance Use Topics  . Smoking status: Former Smoker    Start date: 01/11/2013  . Smokeless tobacco: Never Used  . Alcohol use No     Allergies   Ibuprofen   Review of Systems Review of Systems  Constitutional: Negative for activity change, appetite change and fever.  HENT: Negative for congestion and rhinorrhea.   Eyes: Negative for visual disturbance.  Respiratory: Negative for cough, chest tightness and shortness of breath.   Cardiovascular: Negative for chest pain and palpitations.  Gastrointestinal: Positive for nausea and vomiting. Negative for abdominal pain.  Genitourinary: Negative  for dysuria, hematuria, vaginal bleeding and vaginal discharge.  Musculoskeletal: Negative for arthralgias and myalgias.  Skin: Negative for wound.  Neurological: Positive for headaches. Negative for dizziness, speech difficulty, weakness, light-headedness and numbness.   A complete 10 system review of systems was obtained and all systems are negative except as noted in the HPI and PMH.    Physical Exam Updated Vital Signs SpO2 94%   Physical Exam  Constitutional: She appears well-developed and well-nourished. No distress.  HENT:  Head: Normocephalic and atraumatic.  Mouth/Throat: Oropharynx is clear and moist. No oropharyngeal exudate.  Eyes: Conjunctivae and EOM are normal. Pupils are equal, round, and reactive to light.  Neck: Normal range of motion. Neck supple.  No meningismus.  Cardiovascular: Normal rate, regular rhythm, normal heart sounds and intact distal pulses.   No murmur heard. Pulmonary/Chest: Effort normal and breath sounds normal. No respiratory distress. She exhibits no tenderness.  Abdominal: Soft. There is no tenderness. There is no rebound and no guarding.  Musculoskeletal: Normal range of motion. She exhibits no edema or tenderness.  Neurological: She is  alert. No cranial nerve deficit. She exhibits normal muscle tone. Coordination normal.   5/5 strength throughout. CN 2-12 intact.Equal grip strength. No pronator drift, no ataxia on finger to nose.  Skin: Skin is warm.  Psychiatric: She has a normal mood and affect. Her behavior is normal.  Nursing note and vitals reviewed.    ED Treatments / Results  Labs (all labs ordered are listed, but only abnormal results are displayed) Labs Reviewed  LIPASE, BLOOD - Abnormal; Notable for the following:       Result Value   Lipase 102 (*)    All other components within normal limits  COMPREHENSIVE METABOLIC PANEL - Abnormal; Notable for the following:    Glucose, Bld 103 (*)    BUN 21 (*)    Creatinine, Ser 1.19 (*)    Alkaline Phosphatase 37 (*)    GFR calc non Af Amer 45 (*)    GFR calc Af Amer 53 (*)    All other components within normal limits  CBC  URINALYSIS, ROUTINE W REFLEX MICROSCOPIC (NOT AT [facility])  PROTIME-INR    EKG  EKG Interpretation  Date/Time:  Saturday October 13 2015 13:29:04 EDT Ventricular Rate:  48 PR Interval:    QRS Duration: 99 QT Interval:  496 QTC Calculation: 444 R Axis:   79 Text Interpretation:  Sinus bradycardia Atrial premature complex Abnormal R-wave progression, early transition Minimal ST depression, diffuse leads Rate slower Confirmed by Manus Gunning  MD, Eleanna Theilen [id number]) on 10/13/2015 1:32:38 PM       Radiology Ct Angio Head W Or Wo Contrast  Result Date: 10/13/2015 CLINICAL DATA:  Headache for 2 days. Altered mental status. History of aneurysm. EXAM: CT ANGIOGRAPHY HEAD AND NECK TECHNIQUE: Multidetector CT imaging of the head and neck was performed using the standard protocol during bolus administration of intravenous contrast. Multiplanar CT image reconstructions and MIPs were obtained to evaluate the vascular anatomy. Carotid stenosis measurements (when applicable) are obtained utilizing NASCET criteria, using the distal internal carotid diameter as the  denominator. CONTRAST:  Isovue 370, 50 mL. COMPARISON:  Most recent prior CT head 05/27/2013. Prior CTA head 04/08/2013. Most recent CT chest 12/26/2009. FINDINGS: CT HEAD Calvarium and skull base: No fracture or destructive lesion. Mastoids and middle ears are grossly clear. Paranasal sinuses: Imaged portions are clear. Orbits: Negative. Brain: No evidence of acute abnormality, including acute infarct, hemorrhage, hydrocephalus, or mass lesion. Mild  cerebral and cerebellar atrophy. Chronic microvascular ischemic change affects the periventricular and subcortical white matter. CTA NECK Aortic arch: Standard branching. Imaged portion shows no evidence of aneurysm or dissection. No significant stenosis of the major arch vessel origins. Right carotid system: Mild calcific plaque. No evidence of dissection, stenosis (50% or greater) or occlusion. Left carotid system: Mild calcific plaque. No evidence of dissection, stenosis (50% or greater) or occlusion. Vertebral arteries: Codominant. No evidence of dissection, stenosis (50% or greater) or occlusion. Nonvascular soft tissues: No neck masses. The spondylosis. COPD. 8 x 16 mm irregular mass posterior segment RIGHT upper lobe not present on previous chest CT from 2011. Neoplasm not excluded. No features suggestive of consolidation. Consider formal chest CT with contrast for further evaluation. CTA HEAD Anterior circulation: Non stenotic calcific plaque in the carotid siphons. ICA termini widely patent. No large vessel occlusion or focal narrowing. 3 x 4 mm LEFT MCA bifurcation saccular aneurysm appears unruptured and unchanged. No similar abnormalities in the anterior circulation. Posterior circulation: Vertebrals codominant. Both contribute to formation of a normal sized basilar. No significant stenosis, proximal occlusion, aneurysm, or vascular malformation. Venous sinuses: As permitted by contrast timing, patent. Anatomic variants: None of significance. Delayed phase:    No abnormal intracranial enhancement. IMPRESSION: Minor extracranial and intracranial atherosclerotic change as described. No flow reducing lesion, or acute intracranial abnormality. Atrophy and small vessel disease. 3 x 4 mm LEFT MCA bifurcation aneurysm, unruptured and unchanged. No signs of subarachnoid hemorrhage or spasm. 8 x 16 mm posterior segment RIGHT upper lobe mass, not present on previous cross-sectional imaging. In the setting of severe COPD secondary to smoking, lung neoplasm is not excluded. Consider formal CT chest with contrast. Electronically Signed   By: Elsie Stain M.D.   On: 10/13/2015 11:29  Dg Chest 2 View  Result Date: 10/13/2015 CLINICAL DATA:  Confusion.  Difficulty with indication. EXAM: CHEST  2 VIEW COMPARISON:  Two-view chest x-ray 05/26/2013 FINDINGS: Heart size normal. Emphysematous changes are again seen. There is no focal airspace disease. No edema or effusion is present. The visualized soft tissues and bony thorax are unremarkable. IMPRESSION: 1. No acute cardiopulmonary disease or significant interval change. 2. Emphysema. Electronically Signed   By: Marin Roberts M.D.   On: 10/13/2015 12:33  Ct Angio Neck W And/or Wo Contrast  Result Date: 10/13/2015 CLINICAL DATA:  Headache for 2 days. Altered mental status. History of aneurysm. EXAM: CT ANGIOGRAPHY HEAD AND NECK TECHNIQUE: Multidetector CT imaging of the head and neck was performed using the standard protocol during bolus administration of intravenous contrast. Multiplanar CT image reconstructions and MIPs were obtained to evaluate the vascular anatomy. Carotid stenosis measurements (when applicable) are obtained utilizing NASCET criteria, using the distal internal carotid diameter as the denominator. CONTRAST:  Isovue 370, 50 mL. COMPARISON:  Most recent prior CT head 05/27/2013. Prior CTA head 04/08/2013. Most recent CT chest 12/26/2009. FINDINGS: CT HEAD Calvarium and skull base: No fracture or destructive  lesion. Mastoids and middle ears are grossly clear. Paranasal sinuses: Imaged portions are clear. Orbits: Negative. Brain: No evidence of acute abnormality, including acute infarct, hemorrhage, hydrocephalus, or mass lesion. Mild cerebral and cerebellar atrophy. Chronic microvascular ischemic change affects the periventricular and subcortical white matter. CTA NECK Aortic arch: Standard branching. Imaged portion shows no evidence of aneurysm or dissection. No significant stenosis of the major arch vessel origins. Right carotid system: Mild calcific plaque. No evidence of dissection, stenosis (50% or greater) or occlusion. Left carotid system: Mild calcific  plaque. No evidence of dissection, stenosis (50% or greater) or occlusion. Vertebral arteries: Codominant. No evidence of dissection, stenosis (50% or greater) or occlusion. Nonvascular soft tissues: No neck masses. The spondylosis. COPD. 8 x 16 mm irregular mass posterior segment RIGHT upper lobe not present on previous chest CT from 2011. Neoplasm not excluded. No features suggestive of consolidation. Consider formal chest CT with contrast for further evaluation. CTA HEAD Anterior circulation: Non stenotic calcific plaque in the carotid siphons. ICA termini widely patent. No large vessel occlusion or focal narrowing. 3 x 4 mm LEFT MCA bifurcation saccular aneurysm appears unruptured and unchanged. No similar abnormalities in the anterior circulation. Posterior circulation: Vertebrals codominant. Both contribute to formation of a normal sized basilar. No significant stenosis, proximal occlusion, aneurysm, or vascular malformation. Venous sinuses: As permitted by contrast timing, patent. Anatomic variants: None of significance. Delayed phase:   No abnormal intracranial enhancement. IMPRESSION: Minor extracranial and intracranial atherosclerotic change as described. No flow reducing lesion, or acute intracranial abnormality. Atrophy and small vessel disease. 3 x 4  mm LEFT MCA bifurcation aneurysm, unruptured and unchanged. No signs of subarachnoid hemorrhage or spasm. 8 x 16 mm posterior segment RIGHT upper lobe mass, not present on previous cross-sectional imaging. In the setting of severe COPD secondary to smoking, lung neoplasm is not excluded. Consider formal CT chest with contrast. Electronically Signed   By: Elsie Stain M.D.   On: 10/13/2015 11:29   Procedures Procedures (including critical care time)  Medications Ordered in ED Medications  ondansetron (ZOFRAN) injection 4 mg (4 mg Intravenous Given 10/13/15 0910)     Initial Impression / Assessment and Plan / ED Course  I have reviewed the triage vital signs and the nursing notes.  Pertinent labs & imaging results that were available during my care of the patient were reviewed by me and considered in my medical decision making (see chart for details).  Clinical Course  headache since yesterday uncertain in intensity of onset. No focal neurological deficits. At baseline mentation per family. Known history of MCA aneurysm.   Per Dr. Tawana Scale note from 07/19/2015 - "CTA reviewed and shows very small saccular aneurysm at left MCA bifurcation, 3mm. Extensive discussion with patient and husband regarding natural history. Very low risk of rupture.No intervention recommended. Given her underlying dementia would not recommend routine follow up or imaging."  CT negative for hemorrhage. CTA shows unchanged aneurysm without evidence of rupture. Does incidentally show unchanged lung mass.  Discussed with neurology Dr. Hilda Blades. Patient's history is unreliable in terms of intensity of onset of the headache. He states he would not aggressively pursue lumbar puncture if family was not interested given patient's Comorbidities and dementia.  Patient daughter and husband have decided they do not want to pursue lumbar puncture. They understand there is a small risk of missed subarachnoid hemorrhage. However they  state patient has dementia and they would not want her to have any kind of surgery anyway.  Family also states she gets headaches frequently though they initially denied this.   They are aware of the probable lung mass and need for CT scan as an outpatient. Mass did not show up on plain film x-ray. Followup with PCP for chest CT. Follow up with neurology. Return to the ED sooner with worsening headache, change in mental status, or any other concerns.     Final Clinical Impressions(s) / ED Diagnoses   Final diagnoses:  Bad headache    New Prescriptions New Prescriptions   No medications  on file     Glynn Octave, MD 10/13/15 1731

## 2015-10-13 NOTE — Discharge Instructions (Signed)
As we discussed, bleeding in the brain cannot be ruled out without lumbar puncture but you declined this test. Follow up with your primary doctor. You should also have a CT scan of your chest to evaluate for a possible lung mass which could be cancerous. Follow-up with your primary doctor and neurologist. Return to the ED if you develop new or worsening symptoms.

## 2015-10-13 NOTE — ED Triage Notes (Signed)
Per EMS, pt came from home. Pt has had an intermittent headache for the past couple days with some relief from Tylenol 500 mg.  Woke up feeling unwell.  Hx of dementia and aneurysm & PRES.

## 2015-10-16 ENCOUNTER — Emergency Department (HOSPITAL_BASED_OUTPATIENT_CLINIC_OR_DEPARTMENT_OTHER): Payer: Commercial Managed Care - HMO

## 2015-10-16 ENCOUNTER — Ambulatory Visit: Payer: Commercial Managed Care - HMO | Admitting: Family Medicine

## 2015-10-16 ENCOUNTER — Telehealth: Payer: Self-pay | Admitting: Family Medicine

## 2015-10-16 ENCOUNTER — Encounter (HOSPITAL_BASED_OUTPATIENT_CLINIC_OR_DEPARTMENT_OTHER): Payer: Self-pay | Admitting: *Deleted

## 2015-10-16 ENCOUNTER — Emergency Department (HOSPITAL_BASED_OUTPATIENT_CLINIC_OR_DEPARTMENT_OTHER)
Admission: EM | Admit: 2015-10-16 | Discharge: 2015-10-16 | Disposition: A | Discharge status: Home / Self Care | Payer: Commercial Managed Care - HMO | Attending: Emergency Medicine | Admitting: Emergency Medicine

## 2015-10-16 DIAGNOSIS — R51 Headache: Secondary | ICD-10-CM | POA: Insufficient documentation

## 2015-10-16 DIAGNOSIS — Z87891 Personal history of nicotine dependence: Secondary | ICD-10-CM | POA: Diagnosis not present

## 2015-10-16 DIAGNOSIS — N189 Chronic kidney disease, unspecified: Secondary | ICD-10-CM | POA: Diagnosis not present

## 2015-10-16 DIAGNOSIS — I739 Peripheral vascular disease, unspecified: Secondary | ICD-10-CM | POA: Diagnosis not present

## 2015-10-16 DIAGNOSIS — J45909 Unspecified asthma, uncomplicated: Secondary | ICD-10-CM | POA: Insufficient documentation

## 2015-10-16 DIAGNOSIS — E039 Hypothyroidism, unspecified: Secondary | ICD-10-CM | POA: Insufficient documentation

## 2015-10-16 DIAGNOSIS — Z79899 Other long term (current) drug therapy: Secondary | ICD-10-CM | POA: Insufficient documentation

## 2015-10-16 DIAGNOSIS — J449 Chronic obstructive pulmonary disease, unspecified: Secondary | ICD-10-CM | POA: Insufficient documentation

## 2015-10-16 DIAGNOSIS — F0390 Unspecified dementia without behavioral disturbance: Secondary | ICD-10-CM | POA: Diagnosis not present

## 2015-10-16 DIAGNOSIS — R519 Headache, unspecified: Secondary | ICD-10-CM

## 2015-10-16 NOTE — ED Triage Notes (Signed)
Pt was seen at [facility]  For being dizzy on 7/29. Pt is still dizzy  Pt has top of head h/a. Has had hx of aneurysm on brain. No n/v. Pt states she has not felt good since she was seen at [facility].

## 2015-10-16 NOTE — Discharge Instructions (Signed)
Recommend make an appointment with her neurologist in [place] area for follow-up. Today's head CT was negative and no change in the aneurysm.

## 2015-10-16 NOTE — ED Provider Notes (Signed)
[facility] Provider Note   CSN: [id number] Arrival date & time: 10/16/15  2248  First Provider Contact:  First MD Initiated Contact with Patient 10/16/15 1006        History   Chief Complaint Chief Complaint  Patient presents with  . Dizziness    HPI Amy Bates is a 70 y.o. female.  History predominantly provided by the patient's caregiver her husband. Patient with a history of dementia. Patient with complaint of headache since July 27. 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 Patient had CT angios at that time without any significant change. Apparently headache has persisted. History is complicated by the fact that the patient has dementia and husband is not sure how severe the headache is. There has been no nausea or vomiting since the evaluation on the 27th. Patient did have one episode of vomiting at that time. Patient followed by neurology in [facility] about 3 weeks ago did have a change in her medication. Not clear whether this is contributing factor. Patient apparently without any other complaints. There was also the complaint of dizziness subjectively but not observed objectively.   The history is provided by the patient and the spouse.  Dizziness  Associated symptoms: headaches     Past Medical History:  Diagnosis Date  . Allergic rhinitis   . Arthritis   . Asthma   . Chest pain    [facility]  . Chronic kidney disease   . Constipation   . COPD (chronic obstructive pulmonary disease) (HCC)   . Depression   . GERD (gastroesophageal reflux disease)   . History of chicken pox   . Hyperlipidemia   . Hypothyroid   . Peripheral vascular disease (HCC)   . Pneumonia   . PRES (posterior reversible encephalopathy syndrome)   . Urinary incontinence     Patient Active Problem List   Diagnosis Date Noted  . Balloon like swelling of an artery of the brain  07/19/2015  . Gingival and periodontal disease 08/02/2014  . 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 Anoxic brain damage (HCC) 11/07/2013  . Elevated BP 10/24/2013  . Slow transit constipation 09/12/2013  . Protein calorie malnutrition (HCC) 09/12/2013  . Dysmetria 08/11/2013  . Other reduced mobility 08/11/2013  . Bad memory 08/11/2013  . Injury brain, traumatic (HCC) 06/21/2013  . PRES (posterior reversible encephalopathy syndrome) 05/28/2013  . Seizures (HCC) 05/28/2013  . Fall at home 05/27/2013  . Altered mental status 05/27/2013  . 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 Headache(784.0) 03/01/2013  . Family history of brain aneurysm 03/01/2013  . Abdominal aneurysm without mention of rupture 02/07/2013  . AAA (abdominal aortic aneurysm) (HCC) 01/11/2013  . Abdominal pain, diffuse 01/10/2013  . Memory loss 01/10/2013  . COPD exacerbation (HCC) 06/01/2012  . Bronchitis 12/17/2010  . Insomnia 10/27/2010  . Tobacco use disorder 07/30/2010  . General medical examination 07/22/2010  . Weight loss 07/22/2010  . Hypothyroidism 03/26/2010  . Hyperlipidemia 03/21/2010  . Depression 03/21/2010  . ALLERGIC RHINITIS 03/21/2010  . ASTHMA 03/21/2010  . COPD (chronic obstructive pulmonary disease) with chronic bronchitis (HCC) 03/21/2010  . GERD 03/21/2010  . URINARY INCONTINENCE 03/21/2010    Past Surgical History:  Procedure Laterality Date  . ABDOMINAL AORTIC ANEURYSM REPAIR    . ENDOVASCULAR STENT INSERTION N/A 01/11/2013   Procedure: ENDOVASCULAR STENT GRAFT INSERTION; ULTRASOUND GUIDED, Renal Stent left renal artery , open left  brachial artery closure.;  Surgeon: Nada Libman, MD;  Location: [facility] OR;  Service: Vascular;  Laterality: N/A;  . JOINT REPLACEMENT    . KNEE SURGERY     right  . TONSILLECTOMY    . TUBAL LIGATION      OB History    Gravida Para Term Preterm AB Living   3 3           SAB TAB Ectopic Multiple Live Births                   Home Medications      Prior to Admission medications   Medication Sig Start Date End Date Taking? Authorizing Provider  acetaminophen (TYLENOL) 500 MG tablet Take 500 mg by mouth every 6 (six) hours as needed.   Yes Historical Provider, MD  albuterol (PROVENTIL HFA;VENTOLIN HFA) 108 (90 Base) MCG/ACT inhaler Inhale into the lungs every 6 (six) hours as needed for wheezing or shortness of breath.   Yes Historical Provider, MD  alendronate (FOSAMAX) 70 MG tablet TAKE 1 TABLET BY MOUTH WITH A FULL GLASS OF WATER ON AN EMPTY STOMACH EVERY 7 DAYS. 04/12/15  Yes Grayling Congress Lowne Chase, DO  atorvastatin (LIPITOR) 40 MG tablet TAKE 1 TABLET EVERY DAY 10/08/15  Yes Yvonne R Lowne Chase, DO  Calcium Carbonate-Vitamin D (CALCIUM-D PO) Take 600-800 mg by mouth daily.   Yes Historical Provider, MD  donepezil (ARICEPT) 5 MG tablet Take 5 mg by mouth at bedtime. Started on 10-05-15. For 5 mg for 30 days. Then will go to 10 mg in august 11-05-15   Yes Historical Provider, MD  fenofibrate 160 MG tablet TAKE 1 TABLET EVERY DAY 08/03/15  Yes Yvonne R Lowne Chase, DO  levothyroxine (SYNTHROID, LEVOTHROID) 75 MCG tablet Take 1 tablet (75 mcg total) by mouth daily. 05/21/15  Yes Yvonne R Lowne Chase, DO  loratadine (ALLERGY RELIEF) 10 MG tablet Take 10 mg by mouth daily.   Yes Historical Provider, MD  methylcellulose (CITRUCEL) oral powder Take 1 packet by mouth daily. Dissolve 1 heaping tablespoon  in 8 oz of cold water 07/06/15  Yes Linwood Dibbles, MD  pantoprazole (PROTONIX) 40 MG tablet TAKE 1 TABLET EVERY DAY 04/23/15  Yes Grayling Congress Lowne Chase, DO  PARoxetine (PAXIL) 20 MG tablet Take 1 tablet (20 mg total) by mouth daily. 07/23/15  Yes Donato Schultz, DO    Family History Family History  Problem Relation Age of Onset  . Coronary artery disease Mother   . Stroke Mother   . Heart disease Mother     before age 36  . Multiple sclerosis Sister   . AAA (abdominal aortic aneurysm) Sister   . Heart disease Father   . Hypertension Neg Hx   .  Diabetes Neg Hx   . Cancer Neg Hx     Social History Social History  Substance Use Topics  . Smoking status: Former Smoker    Start date: 01/11/2013  . Smokeless tobacco: Never Used  . Alcohol use No     Allergies   Ibuprofen   Review of Systems Review of Systems  Unable to perform ROS: Dementia  Neurological: Positive for dizziness and headaches.  Psychiatric/Behavioral: Positive for confusion.     Physical Exam Updated Vital Signs BP 150/75 (BP Location: Right Arm)   Pulse 67   Temp 97.5 F (36.4 C) (Oral)   Resp 18   Ht  (1.803 m)   Wt 61.2 kg  SpO2 96%   BMI 18.83 kg/m   Physical Exam  Constitutional: She appears well-developed and well-nourished. No distress.  HENT:  Head: Normocephalic and atraumatic.  Mouth/Throat: Oropharynx is clear and moist.  Eyes: Conjunctivae and EOM are normal. Pupils are equal, round, and reactive to light.  Neck: Normal range of motion. Neck supple.  Cardiovascular: Normal rate, regular rhythm and normal heart sounds.   Pulmonary/Chest: Effort normal and breath sounds normal.  Abdominal: Soft. Bowel sounds are normal. There is no tenderness.  Musculoskeletal: Normal range of motion. She exhibits no edema.  Neurological: She is alert. No cranial nerve deficit. She exhibits normal muscle tone.  Skin: Skin is warm. No erythema.  Nursing note and vitals reviewed.    ED Treatments / Results  Labs (all labs ordered are listed, but only abnormal results are displayed) Labs Reviewed - No data to display  EKG  EKG Interpretation None       Radiology Ct Head Wo Contrast  Result Date: 10/16/2015 CLINICAL DATA:  Dizziness, headache today.  History of aneurysm. EXAM: CT HEAD WITHOUT CONTRAST TECHNIQUE: Contiguous axial images were obtained from the base of the skull through the vertex without intravenous contrast. COMPARISON:  Head CT and angiogram dated 10/13/2015. FINDINGS: Brain: Again noted is mild generalized  parenchymal atrophy with commensurate dilatation of the ventricles and sulci. Confluent areas of chronic small vessel ischemic change again noted within the bilateral periventricular and subcortical white matter regions. There is no mass, hemorrhage, edema or other evidence of acute parenchymal abnormality. No extra-axial hemorrhage. Vascular: No hyperdense vessel or unexpected calcification. The 4 mm left MCA bifurcation aneurysm is better seen on recent CT angiogram. Skull: Negative for fracture or focal lesion. Sinuses/Orbits: No acute findings. Other: None. IMPRESSION: 1. No acute intracranial abnormality. No intracranial hemorrhage or edema. 2. 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 3. Atrophy and chronic ischemic changes in the white matter. Electronically Signed   By: Bary Richard M.D.   On: 10/16/2015 11:47    Procedures Procedures (including critical care time)  Medications Ordered in ED Medications - No data to display   Initial Impression / Assessment and Plan / ED Course  I have reviewed the triage vital signs and the nursing notes.  Pertinent labs & imaging results that were available during my care of the patient were reviewed by me and considered in my medical decision making (see chart for details).  Clinical Course    Patient seen July 27 with extensive workup for the headaches. 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 Also no evidence of any bleed. Today's repeat head CT shows no evidence of bleed. Exact cause of the headaches are clear however patient does have dementia so it is difficult to ascertain whether there is any significant headaches or not. Patient appears in no discomfort in no acute distress. Patient is followed by neurology in [place]. There was a recent change in her medications as possible could be playing a role. Recommend follow-up with neurology.  Final  Clinical Impressions(s) / ED Diagnoses   Final diagnoses:  Intractable headache, unspecified chronicity pattern, unspecified headache type    New Prescriptions New Prescriptions   No medications on file     Vanetta Mulders, MD 10/16/15 1233

## 2015-10-17 NOTE — Telephone Encounter (Signed)
Pt was no show 10/16/15 for f/u, pt has not rescheduled, 1st no show, multiple cancellations, charge or no charge?

## 2015-10-18 ENCOUNTER — Encounter (HOSPITAL_COMMUNITY): Payer: Self-pay

## 2015-10-18 ENCOUNTER — Emergency Department (HOSPITAL_COMMUNITY)
Admission: EM | Admit: 2015-10-18 | Discharge: 2015-10-18 | Disposition: A | Discharge status: Home / Self Care | Payer: Commercial Managed Care - HMO | Attending: Emergency Medicine | Admitting: Emergency Medicine

## 2015-10-18 DIAGNOSIS — G4489 Other headache syndrome: Secondary | ICD-10-CM | POA: Diagnosis not present

## 2015-10-18 DIAGNOSIS — Z87891 Personal history of nicotine dependence: Secondary | ICD-10-CM | POA: Diagnosis not present

## 2015-10-18 DIAGNOSIS — N189 Chronic kidney disease, unspecified: Secondary | ICD-10-CM | POA: Insufficient documentation

## 2015-10-18 DIAGNOSIS — R42 Dizziness and giddiness: Secondary | ICD-10-CM | POA: Diagnosis not present

## 2015-10-18 DIAGNOSIS — R519 Headache, unspecified: Secondary | ICD-10-CM

## 2015-10-18 DIAGNOSIS — R51 Headache: Secondary | ICD-10-CM | POA: Insufficient documentation

## 2015-10-18 DIAGNOSIS — R404 Transient alteration of awareness: Secondary | ICD-10-CM | POA: Diagnosis not present

## 2015-10-18 DIAGNOSIS — E039 Hypothyroidism, unspecified: Secondary | ICD-10-CM | POA: Diagnosis not present

## 2015-10-18 DIAGNOSIS — J449 Chronic obstructive pulmonary disease, unspecified: Secondary | ICD-10-CM | POA: Insufficient documentation

## 2015-10-18 LAB — CBC WITH DIFFERENTIAL/PLATELET
Basophils Absolute: 0.1 K/uL (ref 0.0–0.1)
Basophils Relative: 1 %
Eosinophils Absolute: 0.1 K/uL (ref 0.0–0.7)
Eosinophils Relative: 1 %
HCT: 39.9 % (ref 36.0–46.0)
Hemoglobin: 13.7 g/dL (ref 12.0–15.0)
Lymphocytes Relative: 38 %
Lymphs Abs: 2.1 K/uL (ref 0.7–4.0)
MCH: 31 pg (ref 26.0–34.0)
MCHC: 34.3 g/dL (ref 30.0–36.0)
MCV: 90.3 fL (ref 78.0–100.0)
Monocytes Absolute: 0.3 K/uL (ref 0.1–1.0)
Monocytes Relative: 6 %
Neutro Abs: 2.9 K/uL (ref 1.7–7.7)
Neutrophils Relative %: 54 %
Platelets: ADEQUATE K/uL (ref 150–400)
RBC: 4.42 MIL/uL (ref 3.87–5.11)
RDW: 13 % (ref 11.5–15.5)
WBC: 5.5 K/uL (ref 4.0–10.5)

## 2015-10-18 LAB — COMPREHENSIVE METABOLIC PANEL WITH GFR
ALT: 21 U/L (ref 14–54)
AST: 54 U/L — ABNORMAL HIGH (ref 15–41)
Albumin: 4 g/dL (ref 3.5–5.0)
Alkaline Phosphatase: 31 U/L — ABNORMAL LOW (ref 38–126)
Anion gap: 11 (ref 5–15)
BUN: 17 mg/dL (ref 6–20)
CO2: 23 mmol/L (ref 22–32)
Calcium: 9.7 mg/dL (ref 8.9–10.3)
Chloride: 106 mmol/L (ref 101–111)
Creatinine, Ser: 1.26 mg/dL — ABNORMAL HIGH (ref 0.44–1.00)
GFR calc Af Amer: 49 mL/min — ABNORMAL LOW
GFR calc non Af Amer: 42 mL/min — ABNORMAL LOW
Glucose, Bld: 81 mg/dL (ref 65–99)
Potassium: 3.8 mmol/L (ref 3.5–5.1)
Sodium: 140 mmol/L (ref 135–145)
Total Bilirubin: 0.8 mg/dL (ref 0.3–1.2)
Total Protein: 6.5 g/dL (ref 6.5–8.1)

## 2015-10-18 LAB — URINALYSIS, ROUTINE W REFLEX MICROSCOPIC
Bilirubin Urine: NEGATIVE
Glucose, UA: NEGATIVE mg/dL
Hgb urine dipstick: NEGATIVE
Ketones, ur: NEGATIVE mg/dL
Leukocytes, UA: NEGATIVE
Nitrite: NEGATIVE
Protein, ur: NEGATIVE mg/dL
Specific Gravity, Urine: 1.016 (ref 1.005–1.030)
pH: 7 (ref 5.0–8.0)

## 2015-10-18 MED ORDER — ACETAMINOPHEN 500 MG PO TABS
500.0000 mg | ORAL_TABLET | Freq: Once | ORAL | Status: AC
  Administered 2015-10-18: 500 mg via ORAL
  Filled 2015-10-18: qty 1

## 2015-10-18 NOTE — ED Notes (Signed)
MD at bedside. 

## 2015-10-18 NOTE — ED Notes (Addendum)
Assisted patient to the bathroom and back to her room where she was hooked to the vitals machine

## 2015-10-18 NOTE — ED Triage Notes (Signed)
Pt arrives EMS with c/o headache x 2 days. Husband states he can not get her to take tylenol. Hx of aneurysm

## 2015-10-18 NOTE — ED Notes (Signed)
Assisted patient to the bathroom, collected urine sample and left at bedside. Patient assisted back into the bed and  Hooked back up to the monitor for vitals.

## 2015-10-18 NOTE — ED Notes (Addendum)
Pt oob to bathroom with minimal assist

## 2015-10-18 NOTE — ED Provider Notes (Signed)
[facility]ER[facility]Y DEPT Provider Note   CSN: [id number] Arrival date & time: 10/18/15  7972  First Provider Contact:  First MD Initiated Contact with Patient 10/18/15 1006        History   Chief Complaint Chief Complaint  Patient presents with  . Migraine    HPI Amy Bates is a 70 y.o. female.  Amy Bates is a 70 yo F with a complex pmhx significant for dementia, hypothyroidism, repaired AAA, and small intracranial aneurysm who presents with worsening headaches for the past week. Per husband, patient has a history of chronic headaches and follow with neurology/neurosurgery in [place]. She has been to the ER twice in the past week for similar complaints and head CT at that time showed no acute bleed. She does have a stable 35mm  MCA bifurcation aneurysm that was deemed non operable by neurosurgery - stable on CTA head and neck on 7/29. She has a history of dental surgery with mental in her jaw that is not compatible with MRI. Per husband, HA is worse in the morning after she takes her medication. It is usually relieved by tylenol however patient has refused the past few days. On ROS, husband endorses recent episodes of incontinence which is new over the past week. Denies fevers/dysuria. No abdominal pain.       Past Medical History:  Diagnosis Date  . Allergic rhinitis   . Arthritis   . Asthma   . Chest pain    [facility]  . Chronic kidney disease   . Constipation   . COPD (chronic obstructive pulmonary disease) (HCC)   . Depression   . GERD (gastroesophageal reflux disease)   . History of chicken pox   . Hyperlipidemia   . Hypothyroid   . Peripheral vascular disease (HCC)   . Pneumonia   . PRES (posterior reversible encephalopathy syndrome)   . Urinary incontinence     Patient Active Problem List   Diagnosis Date Noted  . Balloon like swelling of an artery of the brain 07/19/2015  . Gingival and periodontal disease 08/02/2014  . Pseudobulbar affect  12/19/2013  . Anoxic brain damage (HCC) 11/07/2013  . Elevated BP 10/24/2013  . Slow transit constipation 09/12/2013  . Protein calorie malnutrition (HCC) 09/12/2013  . Dysmetria 08/11/2013  . Other reduced mobility 08/11/2013  . Bad memory 08/11/2013  . Injury brain, traumatic (HCC) 06/21/2013  . PRES (posterior reversible encephalopathy syndrome) 05/28/2013  . Seizures (HCC) 05/28/2013  . Fall at home 05/27/2013  . Altered mental status 05/27/2013  . 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 Headache(784.0) 03/01/2013  . Family history of brain aneurysm 03/01/2013  . Abdominal aneurysm without mention of rupture 02/07/2013  . AAA (abdominal aortic aneurysm) (HCC) 01/11/2013  . Abdominal pain, diffuse 01/10/2013  . Memory loss 01/10/2013  . COPD exacerbation (HCC) 06/01/2012  . Bronchitis 12/17/2010  . Insomnia 10/27/2010  . Tobacco use disorder 07/30/2010  . General medical examination 07/22/2010  . Weight loss 07/22/2010  . Hypothyroidism 03/26/2010  . Hyperlipidemia 03/21/2010  . Depression 03/21/2010  . ALLERGIC RHINITIS 03/21/2010  . ASTHMA 03/21/2010  . COPD (chronic obstructive pulmonary disease) with chronic bronchitis (HCC) 03/21/2010  . GERD 03/21/2010  . URINARY INCONTINENCE 03/21/2010    Past Surgical History:  Procedure Laterality Date  . ABDOMINAL AORTIC ANEURYSM REPAIR    . ENDOVASCULAR STENT INSERTION N/A 01/11/2013   Procedure: ENDOVASCULAR STENT GRAFT INSERTION; ULTRASOUND GUIDED, Renal Stent left renal artery , open left brachial artery closure.;  Surgeon: Nada Libman, MD;  Location: [facility] OR;  Service: Vascular;  Laterality: N/A;  . JOINT REPLACEMENT    . KNEE SURGERY     right  . TONSILLECTOMY    . TUBAL LIGATION      OB History    Gravida Para Term Preterm AB Living   3 3           SAB TAB Ectopic Multiple Live Births                   Home Medications    Prior to Admission medications   Medication Sig Start Date End Date Taking?  Authorizing Provider  acetaminophen (TYLENOL) 500 MG tablet Take 500 mg by mouth every 6 (six) hours as needed.   Yes Historical Provider, MD  albuterol (PROVENTIL HFA;VENTOLIN HFA) 108 (90 Base) MCG/ACT inhaler Inhale into the lungs every 6 (six) hours as needed for wheezing or shortness of breath.   Yes Historical Provider, MD  alendronate (FOSAMAX) 70 MG tablet TAKE 1 TABLET BY MOUTH WITH A FULL GLASS OF WATER ON AN EMPTY STOMACH EVERY 7 DAYS. 04/12/15  Yes Grayling Congress Lowne Chase, DO  atorvastatin (LIPITOR) 40 MG tablet TAKE 1 TABLET EVERY DAY 10/08/15  Yes Yvonne R Lowne Chase, DO  Calcium Carbonate-Vitamin D (CALCIUM-D PO) Take 600-800 mg by mouth daily.   Yes Historical Provider, MD  donepezil (ARICEPT) 5 MG tablet Take 5 mg by mouth at bedtime. Started on 10-05-15. For 5 mg for 30 days. Then will go to 10 mg in august 11-05-15   Yes Historical Provider, MD  fenofibrate 160 MG tablet TAKE 1 TABLET EVERY DAY 08/03/15  Yes Yvonne R Lowne Chase, DO  levothyroxine (SYNTHROID, LEVOTHROID) 75 MCG tablet Take 1 tablet (75 mcg total) by mouth daily. 05/21/15  Yes Yvonne R Lowne Chase, DO  loratadine (ALLERGY RELIEF) 10 MG tablet Take 10 mg by mouth daily as needed for allergies.    Yes Historical Provider, MD  methylcellulose (CITRUCEL) oral powder Take 1 packet by mouth daily. Dissolve 1 heaping tablespoon  in 8 oz of cold water Patient taking differently: Take 1 packet by mouth daily as needed (fiber supplement). Dissolve 1 heaping tablespoon  in 8 oz of cold water 07/06/15  Yes Linwood Dibbles, MD  pantoprazole (PROTONIX) 40 MG tablet TAKE 1 TABLET EVERY DAY 04/23/15  Yes Grayling Congress Lowne Chase, DO  PARoxetine (PAXIL) 20 MG tablet Take 1 tablet (20 mg total) by mouth daily. 07/23/15  Yes Yvonne R Lowne Chase, DO  psyllium (METAMUCIL) 58.6 % powder Take 1 packet by mouth daily as needed (constipation).   Yes Historical Provider, MD    Family History Family History  Problem Relation Age of Onset  . Coronary artery  disease Mother   . Stroke Mother   . Heart disease Mother     before age 66  . Multiple sclerosis Sister   . AAA (abdominal aortic aneurysm) Sister   . Heart disease Father   . Hypertension Neg Hx   . Diabetes Neg Hx   . Cancer Neg Hx     Social History Social History  Substance Use Topics  . Smoking status: Former Smoker    Start date: 01/11/2013  . Smokeless tobacco: Never Used  . Alcohol use No     Allergies   Ibuprofen   Review of Systems Review of Systems  Constitutional: Negative.   Eyes: Negative.   Respiratory: Negative.   Cardiovascular: Negative.   Gastrointestinal:  Negative.   Endocrine: Negative.   Genitourinary: Positive for frequency.       With incontinence x 1 week  Musculoskeletal: Negative.   Skin: Negative.   Neurological: Positive for headaches. Negative for dizziness, seizures, syncope, speech difficulty, light-headedness and numbness.  Hematological: Negative.   Psychiatric/Behavioral: Negative.      Physical Exam Updated Vital Signs BP 169/77 (BP Location: Left Arm)   Pulse 69   Temp 98.1 F (36.7 C) (Oral)   Resp 20 Comment: Simultaneous filing. User may not have seen previous data.  Ht 5\' 6"  (1.676 m)   Wt 61.2 kg   SpO2 99%   BMI 21.79 kg/m   Physical Exam  Constitutional: She is oriented to person, place, and time. She appears well-developed and well-nourished.  HENT:  Head: Normocephalic and atraumatic.  Eyes: Conjunctivae and EOM are normal. Pupils are equal, round, and reactive to light.  Neck: Normal range of motion. Neck supple.  Cardiovascular: Normal rate, regular rhythm, normal heart sounds and intact distal pulses.  Exam reveals no gallop and no friction rub.   No murmur heard. Pulmonary/Chest: Effort normal and breath sounds normal. She has no wheezes. She has no rales.  Abdominal: Soft. Bowel sounds are normal. She exhibits no distension. There is no tenderness.  Musculoskeletal: Normal range of motion. She  exhibits no edema.  Neurological: She is alert and oriented to person, place, and time. She displays normal reflexes. No cranial nerve deficit. She exhibits normal muscle tone. Coordination normal.  Skin: Skin is warm and dry.  Psychiatric: She has a normal mood and affect.     ED Treatments / Results  Labs (all labs ordered are listed, but only abnormal results are displayed) Labs Reviewed  COMPREHENSIVE METABOLIC PANEL - Abnormal; Notable for the following:       Result Value   Creatinine, Ser 1.26 (*)    AST 54 (*)    Alkaline Phosphatase 31 (*)    GFR calc non Af Amer 42 (*)    GFR calc Af Amer 49 (*)    All other components within normal limits  URINALYSIS, ROUTINE W REFLEX MICROSCOPIC (NOT AT [facility])  CBC WITH DIFFERENTIAL/PLATELET    EKG  EKG Interpretation  Date/Time:  Thursday October 18 2015 09:44:00 EDT Ventricular Rate:  69 PR Interval:    QRS Duration: 100 QT Interval:  468 QTC Calculation: 502 R Axis:   79 Text Interpretation:  Sinus rhythm Atrial premature complex Borderline repolarization abnormality Prolonged QT interval No significant change since last tracing Confirmed by Anitra Lauth  MD, Alphonzo Lemmings ([id number]) on 10/18/2015 10:11:07 AM       Radiology No results found.  Procedures Procedures (including critical care time)  Medications Ordered in ED Medications  acetaminophen (TYLENOL) tablet 500 mg (500 mg Oral Given 10/18/15 1032)     Initial Impression / Assessment and Plan / ED Course  I have reviewed the triage vital signs and the nursing notes.  Pertinent labs & imaging results that were available during my care of the patient were reviewed by me and considered in my medical decision making (see chart for details).  Clinical Course   Patient has been seen and evaluated twice in the past week with similar complaints of headaches. CTA head and neck (7/29) showed stable MCA aneurysm. No evidence of acute bleed on prior CT head (8/1). No repeat imaging  today. Patient with a history of dementia that appears to be worsening over the past month. Per husband, patient has  daily headaches and etiology is unknown. Follows with neurology in [place]. Given tylenol in the ED and recommended f/u with neurology.   Final Clinical Impressions(s) / ED Diagnoses   Final diagnoses:  Headache, unspecified headache type    New Prescriptions Discharge Medication List as of 10/18/2015 11:48 AM       Reymundo Poll, MD 10/18/15 1411    Gwyneth Sprout, MD 10/18/15 1530

## 2015-10-18 NOTE — Telephone Encounter (Signed)
No charge-- she was in hospital

## 2015-10-22 ENCOUNTER — Ambulatory Visit (INDEPENDENT_AMBULATORY_CARE_PROVIDER_SITE_OTHER): Payer: Commercial Managed Care - HMO | Admitting: Family Medicine

## 2015-10-22 VITALS — BP 102/48 | Temp 98.1°F | Resp 17 | Ht 66.0 in | Wt 133.4 lb

## 2015-10-22 DIAGNOSIS — F03918 Unspecified dementia, unspecified severity, with other behavioral disturbance: Secondary | ICD-10-CM

## 2015-10-22 DIAGNOSIS — F0391 Unspecified dementia with behavioral disturbance: Secondary | ICD-10-CM

## 2015-10-22 NOTE — Progress Notes (Signed)
Patient ID: Amy Bates, female    DOB: 06/17/1945  Age: 70 y.o. MRN: [id number]    Subjective:  Subjective  HPI Amy Bates presents for f/u from ER for headache and worsening dementia.  Pt is waiting for pace/ paliiative care.  She sees Neuro at [facility].  Her husband is with her and frustration is better but still evident. She was with her daughter yesterday and played pool and keeps talking about how much fun she had.  She gets mad easily at him and it causes frustration.    Review of Systems  Constitutional: Negative for activity change, appetite change, fatigue and unexpected weight change.  Respiratory: Negative for cough and shortness of breath.   Cardiovascular: Negative for chest pain and palpitations.  Psychiatric/Behavioral: Positive for behavioral problems, confusion and dysphoric mood. Negative for self-injury and suicidal ideas. The patient is nervous/anxious.     History Past Medical History:  Diagnosis Date  . Allergic rhinitis   . Arthritis   . Asthma   . Chest pain    [facility]  . Chronic kidney disease   . Constipation   . COPD (chronic obstructive pulmonary disease) (HCC)   . Depression   . GERD (gastroesophageal reflux disease)   . History of chicken pox   . Hyperlipidemia   . Hypothyroid   . Peripheral vascular disease (HCC)   . Pneumonia   . PRES (posterior reversible encephalopathy syndrome)   . Urinary incontinence     She has a past surgical history that includes Tubal ligation; Tonsillectomy; Knee surgery; Endovascular stent insertion (N/A, 01/11/2013); Abdominal aortic aneurysm repair; and Joint replacement.   Her family history includes AAA (abdominal aortic aneurysm) in her sister; Coronary artery disease in her mother; Heart disease in her father and mother; Multiple sclerosis in her sister; Stroke in her mother.She reports that she has quit smoking. She started smoking about 2 years ago. She has never used smokeless tobacco. She  reports that she does not drink alcohol or use drugs.  Current Outpatient Prescriptions on File Prior to Visit  Medication Sig Dispense Refill  . acetaminophen (TYLENOL) 500 MG tablet Take 500 mg by mouth every 6 (six) hours as needed.    Marland Kitchen albuterol (PROVENTIL HFA;VENTOLIN HFA) 108 (90 Base) MCG/ACT inhaler Inhale into the lungs every 6 (six) hours as needed for wheezing or shortness of breath.    Marland Kitchen alendronate (FOSAMAX) 70 MG tablet TAKE 1 TABLET BY MOUTH WITH A FULL GLASS OF WATER ON AN EMPTY STOMACH EVERY 7 DAYS. 12 tablet 3  . atorvastatin (LIPITOR) 40 MG tablet TAKE 1 TABLET EVERY DAY 90 tablet 0  . Calcium Carbonate-Vitamin D (CALCIUM-D PO) Take 600-800 mg by mouth daily.    Marland Kitchen donepezil (ARICEPT) 5 MG tablet Take 5 mg by mouth at bedtime. Started on 10-05-15. For 5 mg for 30 days. Then will go to 10 mg in august 11-05-15    . fenofibrate 160 MG tablet TAKE 1 TABLET EVERY DAY 90 tablet 1  . levothyroxine (SYNTHROID, LEVOTHROID) 75 MCG tablet Take 1 tablet (75 mcg total) by mouth daily. 90 tablet 3  . loratadine (ALLERGY RELIEF) 10 MG tablet Take 10 mg by mouth daily as needed for allergies.     . methylcellulose (CITRUCEL) oral powder Take 1 packet by mouth daily. Dissolve 1 heaping tablespoon  in 8 oz of cold water (Patient taking differently: Take 1 packet by mouth daily as needed (fiber supplement). Dissolve 1 heaping tablespoon  in 8  oz of cold water) 454 tablet 0  . pantoprazole (PROTONIX) 40 MG tablet TAKE 1 TABLET EVERY DAY 90 tablet 1  . PARoxetine (PAXIL) 20 MG tablet Take 1 tablet (20 mg total) by mouth daily. 90 tablet 1  . psyllium (METAMUCIL) 58.6 % powder Take 1 packet by mouth daily as needed (constipation).     No current facility-administered medications on file prior to visit.      Objective:  Objective  Physical Exam  Constitutional: She is oriented to person, place, and time. She appears well-developed and well-nourished.  HENT:  Head: Normocephalic and atraumatic.    Eyes: Conjunctivae and EOM are normal.  Neck: Normal range of motion. Neck supple. No JVD present. Carotid bruit is not present. No thyromegaly present.  Cardiovascular: Normal rate, regular rhythm and normal heart sounds.   No murmur heard. Pulmonary/Chest: Effort normal and breath sounds normal. No respiratory distress. She has no wheezes. She has no rales. She exhibits no tenderness.  Musculoskeletal: She exhibits no edema.  Neurological: She is alert and oriented to person, place, and time.  Psychiatric: Her mood appears anxious. She is agitated. Cognition and memory are impaired. She exhibits a depressed mood. She exhibits abnormal recent memory.  Pt confused and has trouble finding words Husband is frustrated  It was good for pt to be with her daughter yesterday to give him a break.   Nursing note and vitals reviewed.  BP (!) 102/48 (BP Location: Right Arm, Patient Position: Sitting, Cuff Size: Normal)   Temp 98.1 F (36.7 C) (Oral)   Resp 17   Ht 5\' 6"  (1.676 m)   Wt 133 lb 6.4 oz (60.5 kg)   BMI 21.53 kg/m  Wt Readings from Last 3 Encounters:  10/22/15 133 lb 6.4 oz (60.5 kg)  10/18/15 135 lb (61.2 kg)  10/16/15 135 lb (61.2 kg)     Lab Results  Component Value Date   WBC 5.5 10/18/2015   HGB 13.7 10/18/2015   HCT 39.9 10/18/2015   PLT  10/18/2015    PLATELET CLUMPS NOTED ON SMEAR, COUNT APPEARS ADEQUATE   GLUCOSE 81 10/18/2015   CHOL 123 04/12/2015   TRIG 85.0 04/12/2015   HDL 43.70 04/12/2015   LDLDIRECT 153.9 01/31/2013   LDLCALC 62 04/12/2015   ALT 21 10/18/2015   AST 54 (H) 10/18/2015   NA 140 10/18/2015   K 3.8 10/18/2015   CL 106 10/18/2015   CREATININE 1.26 (H) 10/18/2015   BUN 17 10/18/2015   CO2 23 10/18/2015   TSH 2.43 04/12/2015   INR 1.03 10/13/2015   MICROALBUR 1.1 04/12/2015    No results found.   Assessment & Plan:  Plan  I am having Ms. Endo maintain her albuterol, Calcium Carbonate-Vitamin D (CALCIUM-D PO), loratadine,  acetaminophen, alendronate, pantoprazole, levothyroxine, methylcellulose, PARoxetine, fenofibrate, atorvastatin, donepezil, and psyllium.  No orders of the defined types were placed in this encounter.   Problem List Items Addressed This Visit    None    Visit Diagnoses    Dementia with behavioral disturbance    -  Primary    con't with pace/ palliative care Gave names and numbers of counselors to husband for him to get counseling Follow-up: Return in about 6 months (around 04/23/2016).  Donato SchultzYvonne R Lowne Chase, DO

## 2015-10-22 NOTE — Patient Instructions (Signed)

## 2015-10-22 NOTE — Progress Notes (Signed)
Pre visit review using our clinic review tool, if applicable. No additional management support is needed unless otherwise documented below in the visit note. 

## 2015-11-06 DIAGNOSIS — I671 Cerebral aneurysm, nonruptured: Secondary | ICD-10-CM | POA: Diagnosis not present

## 2015-11-06 DIAGNOSIS — R278 Other lack of coordination: Secondary | ICD-10-CM | POA: Diagnosis not present

## 2015-11-06 DIAGNOSIS — Z5181 Encounter for therapeutic drug level monitoring: Secondary | ICD-10-CM | POA: Diagnosis not present

## 2015-11-06 DIAGNOSIS — G4700 Insomnia, unspecified: Secondary | ICD-10-CM | POA: Diagnosis not present

## 2015-11-27 ENCOUNTER — Ambulatory Visit (INDEPENDENT_AMBULATORY_CARE_PROVIDER_SITE_OTHER): Payer: Commercial Managed Care - HMO | Admitting: *Deleted

## 2015-11-27 DIAGNOSIS — Z23 Encounter for immunization: Secondary | ICD-10-CM

## 2015-12-12 ENCOUNTER — Other Ambulatory Visit: Payer: Self-pay | Admitting: Family Medicine

## 2015-12-19 ENCOUNTER — Other Ambulatory Visit: Payer: Self-pay | Admitting: Family Medicine

## 2016-01-11 ENCOUNTER — Other Ambulatory Visit: Payer: Self-pay | Admitting: Family Medicine

## 2016-01-11 NOTE — Telephone Encounter (Signed)
Patient's husband is calling regarding patient's pantoprazole (PROTONIX) 40 MG tablet. He needs an emergency refill because [employer] did not get the script to her. She only has 3 days worth of pills.  Please advise.    Pharmacy: [facility] [address], [place], [place] [address]  Patient Phone: [phone]

## 2016-01-14 NOTE — Telephone Encounter (Signed)
Patient's husband called stating that [facility] [facility] going [facility] this prescription. He would like to have someone call him back regarding this as there is a lot going on with it.    Phone: [phone]

## 2016-01-16 NOTE — Telephone Encounter (Signed)
Spoke with pt's spouse. He states he filled Rx at local pharmacy and then insurance would not pay for it to come from mail order. States mail order is going to place our Rx on hold until they need it in the future.

## 2016-02-26 ENCOUNTER — Encounter: Payer: Self-pay | Admitting: Family

## 2016-02-26 ENCOUNTER — Ambulatory Visit (INDEPENDENT_AMBULATORY_CARE_PROVIDER_SITE_OTHER): Payer: Commercial Managed Care - HMO | Admitting: Family Medicine

## 2016-02-26 VITALS — BP 100/62 | HR 69 | Temp 98.1°F | Resp 16 | Ht 66.0 in | Wt 132.4 lb

## 2016-02-26 DIAGNOSIS — J441 Chronic obstructive pulmonary disease with (acute) exacerbation: Secondary | ICD-10-CM

## 2016-02-26 DIAGNOSIS — L219 Seborrheic dermatitis, unspecified: Secondary | ICD-10-CM

## 2016-02-26 DIAGNOSIS — G4700 Insomnia, unspecified: Secondary | ICD-10-CM

## 2016-02-26 DIAGNOSIS — G3183 Dementia with Lewy bodies: Secondary | ICD-10-CM

## 2016-02-26 DIAGNOSIS — F411 Generalized anxiety disorder: Secondary | ICD-10-CM

## 2016-02-26 DIAGNOSIS — I6783 Posterior reversible encephalopathy syndrome: Secondary | ICD-10-CM

## 2016-02-26 DIAGNOSIS — F0281 Dementia in other diseases classified elsewhere with behavioral disturbance: Secondary | ICD-10-CM

## 2016-02-26 MED ORDER — DONEPEZIL HCL 10 MG PO TABS: 10.0000 mg | ORAL_TABLET | Freq: Every day | ORAL | 5 refills | Status: AC

## 2016-02-26 MED ORDER — DIVALPROEX SODIUM 250 MG PO DR TAB: DELAYED_RELEASE_TABLET | ORAL | 5 refills | Status: DC

## 2016-02-26 MED ORDER — PAROXETINE HCL 20 MG PO TABS: 20.0000 mg | ORAL_TABLET | Freq: Every day | ORAL | 5 refills | Status: DC

## 2016-02-26 MED ORDER — CICLOPIROX 1 % EX SHAM: MEDICATED_SHAMPOO | CUTANEOUS | 0 refills | Status: AC

## 2016-02-26 MED ORDER — ALBUTEROL SULFATE HFA 108 (90 BASE) MCG/ACT IN AERS: 2.0000 | INHALATION_SPRAY | Freq: Four times a day (QID) | RESPIRATORY_TRACT | 2 refills | Status: AC | PRN

## 2016-02-26 NOTE — Progress Notes (Signed)
Patient ID: Amy Bates, female    DOB: 10/20/1945  Age: 70 y.o. MRN: [id number]    Subjective:  Subjective  HPI Amy Bates presents for f/u pres, memory loss    She is now living with her sister --- her husband tried to have her committed and lost patience with her.    Review of Systems  Constitutional: Positive for fatigue. Negative for activity change, appetite change and unexpected weight change.  Respiratory: Negative for cough and shortness of breath.   Cardiovascular: Negative for chest pain and palpitations.  Psychiatric/Behavioral: Positive for behavioral problems, confusion, decreased concentration, dysphoric mood and sleep disturbance. Negative for hallucinations and self-injury. The patient is nervous/anxious.     History Past Medical History:  Diagnosis Date  . Allergic rhinitis   . Arthritis   . Asthma   . Chest pain    [facility]  . Chronic kidney disease   . Constipation   . COPD (chronic obstructive pulmonary disease) (HCC)   . Depression   . GERD (gastroesophageal reflux disease)   . History of chicken pox   . Hyperlipidemia   . Hypothyroid   . Peripheral vascular disease (HCC)   . Pneumonia   . PRES (posterior reversible encephalopathy syndrome)   . Urinary incontinence     She has a past surgical history that includes Tubal ligation; Tonsillectomy; Knee surgery; Endovascular stent insertion (N/A, 01/11/2013); Abdominal aortic aneurysm repair; and Joint replacement.   Her family history includes AAA (abdominal aortic aneurysm) in her sister; Coronary artery disease in her mother; Heart disease in her father and mother; Multiple sclerosis in her sister; Stroke in her mother.She reports that she has quit smoking. She started smoking about 3 years ago. She has never used smokeless tobacco. She reports that she does not drink alcohol or use drugs.  Current Outpatient Prescriptions on File Prior to Visit  Medication Sig Dispense Refill  .  acetaminophen (TYLENOL) 500 MG tablet Take 500 mg by mouth every 6 (six) hours as needed.    Marland Kitchen. alendronate (FOSAMAX) 70 MG tablet TAKE 1 TABLET BY MOUTH WITH A FULL GLASS OF WATER ON AN EMPTY STOMACH EVERY 7 DAYS. 12 tablet 3  . atorvastatin (LIPITOR) 40 MG tablet TAKE 1 TABLET EVERY DAY 90 tablet 1  . Calcium Carbonate-Vitamin D (CALCIUM-D PO) Take 600-800 mg by mouth daily.    . fenofibrate 160 MG tablet TAKE 1 TABLET EVERY DAY 90 tablet 1  . levothyroxine (SYNTHROID, LEVOTHROID) 75 MCG tablet Take 1 tablet (75 mcg total) by mouth daily. 90 tablet 3  . loratadine (ALLERGY RELIEF) 10 MG tablet Take 10 mg by mouth daily as needed for allergies.     . methylcellulose (CITRUCEL) oral powder Take 1 packet by mouth daily. Dissolve 1 heaping tablespoon  in 8 oz of cold water (Patient taking differently: Take 1 packet by mouth daily as needed (fiber supplement). Dissolve 1 heaping tablespoon  in 8 oz of cold water) 454 tablet 0  . pantoprazole (PROTONIX) 40 MG tablet TAKE 1 TABLET EVERY DAY 90 tablet 1  . PARoxetine (PAXIL) 20 MG tablet Take 1 tablet (20 mg total) by mouth daily. 90 tablet 1  . psyllium (METAMUCIL) 58.6 % powder Take 1 packet by mouth daily as needed (constipation).     No current facility-administered medications on file prior to visit.      Objective:  Objective  Physical Exam  Constitutional: She is oriented to person, place, and time. She appears  well-developed and well-nourished.  HENT:  Head: Normocephalic and atraumatic.  Eyes: Conjunctivae and EOM are normal.  Neck: Normal range of motion. Neck supple. No JVD present. Carotid bruit is not present. No thyromegaly present.  Cardiovascular: Normal rate, regular rhythm and normal heart sounds.   No murmur heard. Pulmonary/Chest: Effort normal and breath sounds normal. No respiratory distress. She has no wheezes. She has no rales. She exhibits no tenderness.  Musculoskeletal: She exhibits no edema.  Neurological: She is alert  and oriented to person, place, and time.  Skin:     Psychiatric: Her mood appears anxious. Her affect is inappropriate. She is agitated. Thought content is not paranoid and not delusional. Cognition and memory are impaired. She expresses inappropriate judgment. She expresses no homicidal and no suicidal ideation. She expresses no suicidal plans and no homicidal plans. She exhibits abnormal recent memory.  Pt with poor memory , concentration Answers questions inappropriately  Nursing note and vitals reviewed.  BP 100/62 (BP Location: Left Arm, Patient Position: Sitting, Cuff Size: Normal)   Pulse 69   Temp 98.1 F (36.7 C) (Oral)   Resp 16   Ht 5\' 6"  (1.676 m)   Wt 132 lb 6.4 oz (60.1 kg)   SpO2 94%   BMI 21.37 kg/m  Wt Readings from Last 3 Encounters:  02/26/16 132 lb 6.4 oz (60.1 kg)  10/22/15 133 lb 6.4 oz (60.5 kg)  10/18/15 135 lb (61.2 kg)     Lab Results  Component Value Date   WBC 5.5 10/18/2015   HGB 13.7 10/18/2015   HCT 39.9 10/18/2015   PLT  10/18/2015    PLATELET CLUMPS NOTED ON SMEAR, COUNT APPEARS ADEQUATE   GLUCOSE 81 10/18/2015   CHOL 123 04/12/2015   TRIG 85.0 04/12/2015   HDL 43.70 04/12/2015   LDLDIRECT 153.9 01/31/2013   LDLCALC 62 04/12/2015   ALT 21 10/18/2015   AST 54 (H) 10/18/2015   NA 140 10/18/2015   K 3.8 10/18/2015   CL 106 10/18/2015   CREATININE 1.26 (H) 10/18/2015   BUN 17 10/18/2015   CO2 23 10/18/2015   TSH 2.43 04/12/2015   INR 1.03 10/13/2015   MICROALBUR 1.1 04/12/2015    No results found.   Assessment & Plan:  Plan  I have discontinued Amy Bates's donepezil. I am also having her start on divalproex, PARoxetine, donepezil, and Ciclopirox. Additionally, I am having her maintain her Calcium Carbonate-Vitamin D (CALCIUM-D PO), loratadine, acetaminophen, alendronate, levothyroxine, methylcellulose, PARoxetine, psyllium, atorvastatin, fenofibrate, pantoprazole, and albuterol.  Meds ordered this encounter  Medications  .  albuterol (PROVENTIL HFA;VENTOLIN HFA) 108 (90 Base) MCG/ACT inhaler    Sig: Inhale 2 puffs into the lungs every 6 (six) hours as needed for wheezing or shortness of breath.    Dispense:  1 Inhaler    Refill:  2  . divalproex (DEPAKOTE) 250 MG DR tablet    Sig: 1 po qhs prn    Dispense:  30 tablet    Refill:  5  . PARoxetine (PAXIL) 20 MG tablet    Sig: Take 1 tablet (20 mg total) by mouth daily.    Dispense:  30 tablet    Refill:  5  . donepezil (ARICEPT) 10 MG tablet    Sig: Take 1 tablet (10 mg total) by mouth at bedtime.    Dispense:  30 tablet    Refill:  5  . Ciclopirox 1 % shampoo    Sig: Massage shampoo in scalp and let sit 5  min and rinse    Dispense:  120 mL    Refill:  0    Problem List Items Addressed This Visit      Unprioritized   COPD exacerbation (HCC) - Primary   Relevant Medications   albuterol (PROVENTIL HFA;VENTOLIN HFA) 108 (90 Base) MCG/ACT inhaler   Insomnia   Relevant Medications   divalproex (DEPAKOTE) 250 MG DR tablet   PRES (posterior reversible encephalopathy syndrome)   Relevant Orders   Ambulatory referral to Hospice    Other Visit Diagnoses    Generalized anxiety disorder       Relevant Medications   PARoxetine (PAXIL) 20 MG tablet   Other Relevant Orders   Ambulatory referral to Hospice   Lewy body dementia with behavioral disturbance       Relevant Medications   divalproex (DEPAKOTE) 250 MG DR tablet   PARoxetine (PAXIL) 20 MG tablet   donepezil (ARICEPT) 10 MG tablet   Other Relevant Orders   Ambulatory referral to Hospice   Seborrheic dermatitis of scalp       Relevant Medications   Ciclopirox 1 % shampoo      Follow-up: Return in about 3 months (around 05/26/2016).  Donato Schultz, DO

## 2016-02-26 NOTE — Progress Notes (Signed)
Pre visit review using our clinic review tool, if applicable. No additional management support is needed unless otherwise documented below in the visit note. 

## 2016-02-28 ENCOUNTER — Encounter: Payer: Self-pay | Admitting: Family Medicine

## 2016-03-03 ENCOUNTER — Ambulatory Visit: Payer: Commercial Managed Care - HMO | Admitting: Family

## 2016-03-03 ENCOUNTER — Telehealth: Payer: Self-pay

## 2016-03-03 ENCOUNTER — Other Ambulatory Visit (HOSPITAL_COMMUNITY): Payer: Commercial Managed Care - HMO

## 2016-03-03 NOTE — Telephone Encounter (Signed)
Dr. Barbee ShropshireHertweck called to inform provider that he does not think patient qualify for Hospice care due to high functional activity. He states that he think patient should continue and remain on palliative care. If provider disagree or have any questions, he can reached at [phone]. LB

## 2016-03-04 ENCOUNTER — Telehealth: Payer: Self-pay | Admitting: Emergency Medicine

## 2016-03-04 NOTE — Telephone Encounter (Signed)
Received Health Care Power of Attorney. Sent for scanning. 

## 2016-03-21 ENCOUNTER — Encounter (HOSPITAL_COMMUNITY): Payer: Self-pay | Admitting: Emergency Medicine

## 2016-03-21 ENCOUNTER — Emergency Department (HOSPITAL_COMMUNITY): Payer: Medicare HMO

## 2016-03-21 ENCOUNTER — Emergency Department (HOSPITAL_COMMUNITY)
Admission: EM | Admit: 2016-03-21 | Discharge: 2016-03-21 | Disposition: A | Discharge status: Home / Self Care | Payer: Medicare HMO | Attending: Emergency Medicine | Admitting: Emergency Medicine

## 2016-03-21 DIAGNOSIS — R4182 Altered mental status, unspecified: Secondary | ICD-10-CM | POA: Diagnosis not present

## 2016-03-21 DIAGNOSIS — F0391 Unspecified dementia with behavioral disturbance: Secondary | ICD-10-CM

## 2016-03-21 DIAGNOSIS — Z9689 Presence of other specified functional implants: Secondary | ICD-10-CM | POA: Insufficient documentation

## 2016-03-21 DIAGNOSIS — N189 Chronic kidney disease, unspecified: Secondary | ICD-10-CM | POA: Diagnosis not present

## 2016-03-21 DIAGNOSIS — Z87891 Personal history of nicotine dependence: Secondary | ICD-10-CM | POA: Diagnosis not present

## 2016-03-21 DIAGNOSIS — E039 Hypothyroidism, unspecified: Secondary | ICD-10-CM | POA: Diagnosis not present

## 2016-03-21 DIAGNOSIS — F0390 Unspecified dementia without behavioral disturbance: Secondary | ICD-10-CM | POA: Diagnosis not present

## 2016-03-21 DIAGNOSIS — J449 Chronic obstructive pulmonary disease, unspecified: Secondary | ICD-10-CM | POA: Insufficient documentation

## 2016-03-21 LAB — URINALYSIS, ROUTINE W REFLEX MICROSCOPIC
Bilirubin Urine: NEGATIVE
GLUCOSE, UA: NEGATIVE mg/dL
HGB URINE DIPSTICK: NEGATIVE
KETONES UR: NEGATIVE mg/dL
LEUKOCYTES UA: NEGATIVE
NITRITE: NEGATIVE
PROTEIN: 30 mg/dL — AB
Specific Gravity, Urine: 1.026 (ref 1.005–1.030)
pH: 5 (ref 5.0–8.0)

## 2016-03-21 LAB — COMPREHENSIVE METABOLIC PANEL
ALBUMIN: 3.7 g/dL (ref 3.5–5.0)
ALT: 28 U/L (ref 14–54)
AST: 54 U/L — ABNORMAL HIGH (ref 15–41)
Alkaline Phosphatase: 36 U/L — ABNORMAL LOW (ref 38–126)
Anion gap: 6 (ref 5–15)
BILIRUBIN TOTAL: 0.6 mg/dL (ref 0.3–1.2)
BUN: 15 mg/dL (ref 6–20)
CHLORIDE: 105 mmol/L (ref 101–111)
CO2: 27 mmol/L (ref 22–32)
Calcium: 9.3 mg/dL (ref 8.9–10.3)
Creatinine, Ser: 1.21 mg/dL — ABNORMAL HIGH (ref 0.44–1.00)
GFR calc Af Amer: 52 mL/min — ABNORMAL LOW (ref >60)
GFR calc non Af Amer: 45 mL/min — ABNORMAL LOW (ref >60)
GLUCOSE: 97 mg/dL (ref 65–99)
POTASSIUM: 3.5 mmol/L (ref 3.5–5.1)
Sodium: 138 mmol/L (ref 135–145)
Total Protein: 6.9 g/dL (ref 6.5–8.1)

## 2016-03-21 LAB — I-STAT TROPONIN, ED: Troponin i, poc: 0 ng/mL (ref 0.00–0.08)

## 2016-03-21 LAB — CBC WITH DIFFERENTIAL/PLATELET
Basophils Absolute: 0 10*3/uL (ref 0.0–0.1)
Basophils Relative: 0 %
Eosinophils Absolute: 0.1 10*3/uL (ref 0.0–0.7)
Eosinophils Relative: 1 %
HEMATOCRIT: 44.9 % (ref 36.0–46.0)
Hemoglobin: 15.2 g/dL — ABNORMAL HIGH (ref 12.0–15.0)
Lymphocytes Relative: 12 %
Lymphs Abs: 0.6 10*3/uL — ABNORMAL LOW (ref 0.7–4.0)
MCH: 30.6 pg (ref 26.0–34.0)
MCHC: 33.9 g/dL (ref 30.0–36.0)
MCV: 90.3 fL (ref 78.0–100.0)
MONO ABS: 0.3 10*3/uL (ref 0.1–1.0)
Monocytes Relative: 6 %
NEUTROS ABS: 4.3 10*3/uL (ref 1.7–7.7)
NEUTROS PCT: 81 %
Platelets: 201 10*3/uL (ref 150–400)
RBC: 4.97 MIL/uL (ref 3.87–5.11)
RDW: 13.2 % (ref 11.5–15.5)
WBC: 5.3 10*3/uL (ref 4.0–10.5)

## 2016-03-21 MED ORDER — ONDANSETRON 4 MG PO TBDP: 4.0000 mg | ORAL_TABLET | Freq: Three times a day (TID) | ORAL | 0 refills | Status: DC | PRN

## 2016-03-21 NOTE — ED Provider Notes (Signed)
[facility]-EMER[facility]Y DEPT Provider Note   CSN: [id number] Arrival date & time: 03/21/16  4098     History   Chief Complaint Chief Complaint  Patient presents with  . Diarrhea  . Emesis    HPI Amy Bates is a 71 y.o. female.  HPI Amy Bates is a 71 y.o. female with history of dementia, COPD, chronic kidney disease, asthma, posterior reversible encephalopathy syndrome, peripheral vascular disease, hypothyroidism, presents emergency department with complaint of altered mental status. Most of history is provided by patient's daughter and sister. Patient lives at home with her daughter. Patient has had nausea, vomiting, diarrhea over the last 2 days. She has been incontinent of stool and urine. She has been confused, "talking out of her head." She has been found taking her depends often smearing her feces all over the bathroom walls and floor. Patient's family is unable to take care of her at home, and has been looking into other options and currently looking for a skilled nursing facility. Today, patient is more confused than usual, so they brought her to the ED for evaluation. Patient denies any complaints at this time.  Past Medical History:  Diagnosis Date  . Allergic rhinitis   . Arthritis   . Asthma   . Chest pain    [facility]  . Chronic kidney disease   . Constipation   . COPD (chronic obstructive pulmonary disease) (HCC)   . Depression   . GERD (gastroesophageal reflux disease)   . History of chicken pox   . Hyperlipidemia   . Hypothyroid   . Peripheral vascular disease (HCC)   . Pneumonia   . PRES (posterior reversible encephalopathy syndrome)   . Urinary incontinence     Patient Active Problem List   Diagnosis Date Noted  . Balloon like swelling of an artery of the brain 07/19/2015  . Gingival and periodontal disease 08/02/2014  . 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 Anoxic brain damage (HCC) 11/07/2013  . Elevated BP 10/24/2013  . Slow transit constipation  09/12/2013  . Protein calorie malnutrition (HCC) 09/12/2013  . Dysmetria 08/11/2013  . Other reduced mobility 08/11/2013  . Bad memory 08/11/2013  . Injury brain, traumatic (HCC) 06/21/2013  . PRES (posterior reversible encephalopathy syndrome) 05/28/2013  . Seizures (HCC) 05/28/2013  . Fall at home 05/27/2013  . Altered mental status 05/27/2013  . 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 Headache(784.0) 03/01/2013  . Family history of brain aneurysm 03/01/2013  . Abdominal aneurysm without mention of rupture 02/07/2013  . AAA (abdominal aortic aneurysm) (HCC) 01/11/2013  . Abdominal pain, diffuse 01/10/2013  . Memory loss 01/10/2013  . COPD exacerbation (HCC) 06/01/2012  . Bronchitis 12/17/2010  . Insomnia 10/27/2010  . Tobacco use disorder 07/30/2010  . General medical examination 07/22/2010  . Weight loss 07/22/2010  . Hypothyroidism 03/26/2010  . Hyperlipidemia 03/21/2010  . Depression 03/21/2010  . ALLERGIC RHINITIS 03/21/2010  . ASTHMA 03/21/2010  . COPD (chronic obstructive pulmonary disease) with chronic bronchitis (HCC) 03/21/2010  . GERD 03/21/2010  . URINARY INCONTINENCE 03/21/2010    Past Surgical History:  Procedure Laterality Date  . ABDOMINAL AORTIC ANEURYSM REPAIR    . ENDOVASCULAR STENT INSERTION N/A 01/11/2013   Procedure: ENDOVASCULAR STENT GRAFT INSERTION; ULTRASOUND GUIDED, Renal Stent left renal artery , open left brachial artery closure.;  Surgeon: Nada Libman, MD;  Location: [facility] OR;  Service: Vascular;  Laterality: N/A;  . JOINT REPLACEMENT    . KNEE SURGERY     right  .  TONSILLECTOMY    . TUBAL LIGATION      OB History    Gravida Para Term Preterm AB Living   3 3           SAB TAB Ectopic Multiple Live Births                   Home Medications    Prior to Admission medications   Medication Sig Start Date End Date Taking? Authorizing Provider  acetaminophen (TYLENOL) 500 MG tablet Take 500 mg by mouth every 6 (six) hours as  needed.    Historical Provider, MD  albuterol (PROVENTIL HFA;VENTOLIN HFA) 108 (90 Base) MCG/ACT inhaler Inhale 2 puffs into the lungs every 6 (six) hours as needed for wheezing or shortness of breath. 02/26/16   Grayling CongressYvonne R Lowne Chase, DO  alendronate (FOSAMAX) 70 MG tablet TAKE 1 TABLET BY MOUTH WITH A FULL GLASS OF WATER ON AN EMPTY STOMACH EVERY 7 DAYS. 04/12/15   Lelon PerlaYvonne R Lowne Chase, DO  atorvastatin (LIPITOR) 40 MG tablet TAKE 1 TABLET EVERY DAY 12/12/15   Lelon PerlaYvonne R Lowne Chase, DO  Calcium Carbonate-Vitamin D (CALCIUM-D PO) Take 600-800 mg by mouth daily.    Historical Provider, MD  Ciclopirox 1 % shampoo Massage shampoo in scalp and let sit 5 min and rinse 02/26/16   Grayling CongressYvonne R Lowne Chase, DO  divalproex (DEPAKOTE) 250 MG DR tablet 1 po qhs prn 02/26/16   Grayling CongressYvonne R Lowne Chase, DO  donepezil (ARICEPT) 10 MG tablet Take 1 tablet (10 mg total) by mouth at bedtime. 02/26/16   Lelon PerlaYvonne R Lowne Chase, DO  fenofibrate 160 MG tablet TAKE 1 TABLET EVERY DAY 12/19/15   Lelon PerlaYvonne R Lowne Chase, DO  levothyroxine (SYNTHROID, LEVOTHROID) 75 MCG tablet Take 1 tablet (75 mcg total) by mouth daily. 05/21/15   Lelon PerlaYvonne R Lowne Chase, DO  loratadine (ALLERGY RELIEF) 10 MG tablet Take 10 mg by mouth daily as needed for allergies.     Historical Provider, MD  methylcellulose (CITRUCEL) oral powder Take 1 packet by mouth daily. Dissolve 1 heaping tablespoon  in 8 oz of cold water Patient taking differently: Take 1 packet by mouth daily as needed (fiber supplement). Dissolve 1 heaping tablespoon  in 8 oz of cold water 07/06/15   Linwood DibblesJon Knapp, MD  pantoprazole (PROTONIX) 40 MG tablet TAKE 1 TABLET EVERY DAY 01/14/16   Lelon PerlaYvonne R Lowne Chase, DO  PARoxetine (PAXIL) 20 MG tablet Take 1 tablet (20 mg total) by mouth daily. 07/23/15   Lelon PerlaYvonne R Lowne Chase, DO  PARoxetine (PAXIL) 20 MG tablet Take 1 tablet (20 mg total) by mouth daily. 02/26/16   Grayling CongressYvonne R Lowne Chase, DO  psyllium (METAMUCIL) 58.6 % powder Take 1 packet by mouth daily as  needed (constipation).    Historical Provider, MD    Family History Family History  Problem Relation Age of Onset  . Coronary artery disease Mother   . Stroke Mother   . Heart disease Mother     before age 70  . Multiple sclerosis Sister   . AAA (abdominal aortic aneurysm) Sister   . Heart disease Father   . Hypertension Neg Hx   . Diabetes Neg Hx   . Cancer Neg Hx     Social History Social History  Substance Use Topics  . Smoking status: Former Smoker    Start date: 01/11/2013  . Smokeless tobacco: Never Used  . Alcohol use No     Allergies   Ibuprofen  Review of Systems Review of Systems  Unable to perform ROS: Mental status change  Psychiatric/Behavioral: Positive for confusion.     Physical Exam Updated Vital Signs BP 117/75   Temp 97.3 F (36.3 C) (Oral)   Resp 12   Wt 59 kg   SpO2 98%   BMI 20.98 kg/m   Physical Exam  Constitutional: She appears well-developed and well-nourished. No distress.  HENT:  Head: Normocephalic.  Oral mucosa dry  Eyes: Conjunctivae and EOM are normal. Pupils are equal, round, and reactive to light.  Neck: Neck supple.  Cardiovascular: Normal rate, regular rhythm and normal heart sounds.   Pulmonary/Chest: Effort normal and breath sounds normal. No respiratory distress. She has no wheezes. She has no rales.  Abdominal: Soft. Bowel sounds are normal. She exhibits no distension. There is no tenderness. There is no rebound.  Musculoskeletal: She exhibits no edema.  Neurological: She is alert. She displays normal reflexes. No cranial nerve deficit. She exhibits normal muscle tone.  Disoriented. Unable to recall location, her name or her birthday, today's date. Moving all extremities.5/5 and equal strength in upper extremities and lower extremities bilaterally.  Skin: Skin is warm and dry.  Psychiatric:  Patient is very tearful 1 minute, laughing inappropriately the next minute.  Nursing note and vitals reviewed.    ED  Treatments / Results  Labs (all labs ordered are listed, but only abnormal results are displayed) Labs Reviewed  CBC WITH DIFFERENTIAL/PLATELET - Abnormal; Notable for the following:       Result Value   Hemoglobin 15.2 (*)    Lymphs Abs 0.6 (*)    All other components within normal limits  COMPREHENSIVE METABOLIC PANEL - Abnormal; Notable for the following:    Creatinine, Ser 1.21 (*)    AST 54 (*)    Alkaline Phosphatase 36 (*)    GFR calc non Af Amer 45 (*)    GFR calc Af Amer 52 (*)    All other components within normal limits  URINALYSIS, ROUTINE W REFLEX MICROSCOPIC - Abnormal; Notable for the following:    Protein, ur 30 (*)    Bacteria, UA RARE (*)    Squamous Epithelial / LPF 0-5 (*)    All other components within normal limits  I-STAT TROPOININ, ED    EKG  EKG Interpretation  Date/Time:  Friday March 21 2016 11:28:08 EST Ventricular Rate:  58 PR Interval:    QRS Duration: 96 QT Interval:  417 QTC Calculation: 410 R Axis:   83 Text Interpretation:  Sinus rhythm Borderline right axis deviation Minimal ST depression, inferior leads Baseline wander in lead(s) I III aVL V1 no sig change from previous Confirmed by Donnald Garre, MD, Lebron Conners [phone]) on 03/21/2016 3:07:47 PM       Radiology Ct Head Wo Contrast  Result Date: 03/21/2016 CLINICAL DATA:  Altered mental status.  Vomiting and diarrhea EXAM: CT HEAD WITHOUT CONTRAST TECHNIQUE: Contiguous axial images were obtained from the base of the skull through the vertex without intravenous contrast. COMPARISON:  Head CT 10/16/2015 FINDINGS: Brain: No acute intracranial hemorrhage. No focal mass lesion. No CT evidence of acute infarction. No midline shift or mass effect. No hydrocephalus. Basilar cisterns are patent. There are periventricular and subcortical white matter hypodensities. Generalized cortical atrophy. Vascular: No hyperdense vessel or unexpected calcification. Skull: Normal. Negative for fracture or focal lesion.  Sinuses/Orbits: Paranasal sinuses and mastoid air cells are clear. Orbits are clear. Other: None IMPRESSION: 1. No acute intracranial findings. 2. Extensive periventricular white  matter microvascular disease 3. Moderate atrophy for age. 4. No change from prior Electronically Signed   By: Genevive Bi M.D.   On: 03/21/2016 11:09    Procedures Procedures (including critical care time)  Medications Ordered in ED Medications - No data to display   Initial Impression / Assessment and Plan / ED Course  I have reviewed the triage vital signs and the nursing notes.  Pertinent labs & imaging results that were available during my care of the patient were reviewed by me and considered in my medical decision making (see chart for details).  Clinical Course     Patient with history of dementia, here with progression of symptoms, and significantly worsened symptoms over the last several days. Patient has also apparently had nausea, vomiting, diarrhea for the last 2 days. Patient is alert, in no acute distress, vital signs normal, however is confused. Family states that patient is more confused than baseline. Will check labs, CT head, urinalysis.  CT, UA, Labs all at baseline. Pt was seen by Morrie Sheldon, Child psychotherapist. Pt will have PT/OT consult to evaluate for placement.   4:38 PM I got called into the room because pt's daughter wants pt to be discharged home. Pt is becoming more agitated, pulling leads off. Family stated they are tired of waiting and will pursue placement on their own.   Vitals:   03/21/16 1315 03/21/16 1330 03/21/16 1345 03/21/16 1400  BP: 127/56 117/60 118/60 126/57  Pulse: (!) 51 62 66 62  Resp:  16 17 17   Temp:      TempSrc:      SpO2: (!) 87% 99% 100% 99%  Weight:         Final Clinical Impressions(s) / ED Diagnoses   Final diagnoses:  Dementia with behavioral disturbance, unspecified dementia type    New Prescriptions Discharge Medication List as of 03/21/2016  4:52  PM    START taking these medications   Details  ondansetron (ZOFRAN-ODT) 4 MG disintegrating tablet Take 1 tablet (4 mg total) by mouth every 8 (eight) hours as needed for nausea or vomiting., Starting Fri 03/21/2016, Print         [employer], PA-C 03/22/16 1513    Arby Barrette, MD 03/24/16 1621

## 2016-03-21 NOTE — ED Notes (Signed)
Pt verbalized understanding discharge instructions and denies any further needs or questions at this time. VS stable, ambulatory and steady gait.   

## 2016-03-21 NOTE — ED Notes (Signed)
Patient transported to CT 

## 2016-03-21 NOTE — ED Notes (Signed)
PA Kirchenko advised this RN that patient has home meds at bedside and that she told patient and family at bedside it was fine for patient to take her daily meds at this time

## 2016-03-21 NOTE — ED Notes (Signed)
Pt's family just told this RN they would like to talk to social work again about placement for the pt.  Pt has a consult to social work ordered, this RN will follow up.

## 2016-03-21 NOTE — ED Triage Notes (Addendum)
Pt is here with granddaughter who helps care for pt, grand daughter reports pt had some diarrhea yesterday and when she woke up this morning she found pt in bathroom with vomit and diarrhea on her. Pt has hx of dementia but pts family member reports pt has been "talking out of her head." pt denies pain or nausea at this time. Family member reports pt was able to eat bagel and drink gatorade this am with no issue

## 2016-03-21 NOTE — Progress Notes (Signed)
PATIENT'S ESTRANGED HUSBAND IS NOT TO HAVE ANY CONTACT WITH PATIENT.   CSW engaged with Patient and Patient's family at bedside. Patient's daughter is HCPOA. Per Patient's family, Patient has a hx of worsening dementia. Patient's family reports wanting facility placement for Patient as they are unable to care for her in the home. CSW discussed the SNF placement process with family. CSW explained to family that after discussion with Attending MD, it is very likely that Patient's insurance will not cover nursing home placement as Patient does not appear to have any skillable needs. Patient's family is unable to pay privately for a facility. CSW explained the process of applying for LTC Medicaid for ALF/Memory Care facility. Patient's family agreeable to start Medicaid application. Patient's family has been working with [facility] for potential placement. CSW has requested PT consult for recommendation. CSW provided Patient's family with information about local skilled nursing facilities, assisted living facilities, and applying for LTC Medicaid.  Patient's family also requesting if [facility] as they were instructed by Patient's primary care physician that if they brought her to the ED, [facility] at which time she could then be placed at a facility. CSW explained the inpatient psychiatric referral process to Patient and Patient's family.   Disposition pending PT consult. CSW continues to follow.    Enos FlingAshley Malaysha Arlen, MSW, LCSW [facility] ED/[facility]M Clinical Social Worker [phone]

## 2016-03-21 NOTE — Discharge Instructions (Signed)
Please follow up with family doctor for assisted living facility placement.

## 2016-03-24 ENCOUNTER — Telehealth: Payer: Self-pay | Admitting: Family Medicine

## 2016-03-24 NOTE — Telephone Encounter (Signed)
Spoke with pt's daughter to inform pt we received the Fl2 forms and it's being processed. Informed pt's daughter she will receive a call when paperwork is complete. LB

## 2016-03-24 NOTE — Telephone Encounter (Signed)
Informed pt's daughter F L 2 forms were received and are being processed. LB

## 2016-03-24 NOTE — Telephone Encounter (Signed)
Caller name:Lewis,Stephanie Relation to pt: daughter  Call back number: [phone]    Reason for call:  Patient was seen in the ED over the weekend and  [facility] [place]  advised daughter to retrieve a FL2 form from PCP, please advise

## 2016-03-24 NOTE — Telephone Encounter (Signed)
Pt's daughter dropped off document to be filled out (Yellow folder with documents [facility] [employer]) Pt will pick up when ready  [facility] [phone] and if possible to have it ready before Friday 03-28-2016. Document put at front office tray.

## 2016-03-24 NOTE — Telephone Encounter (Signed)
Patient's daughter is calling to follow up on these forms. She states that she doesn't have anyone to watch her mother anymore while she works. She is trying to get this moving so she can get her into the nursing facility. Please advise.   Phone: [phone]

## 2016-03-26 NOTE — Telephone Encounter (Signed)
Pt's daughter called in to follow up FL2 form. Informed of current status due to nurse last note. She says that she was just inquiring.

## 2016-04-01 ENCOUNTER — Encounter: Payer: Self-pay | Admitting: Family Medicine

## 2016-04-01 ENCOUNTER — Ambulatory Visit (INDEPENDENT_AMBULATORY_CARE_PROVIDER_SITE_OTHER): Payer: Medicare HMO | Admitting: Family Medicine

## 2016-04-01 VITALS — BP 108/62 | HR 74 | Temp 97.6°F | Ht 66.0 in | Wt 131.5 lb

## 2016-04-01 DIAGNOSIS — G4700 Insomnia, unspecified: Secondary | ICD-10-CM | POA: Diagnosis not present

## 2016-04-01 DIAGNOSIS — K219 Gastro-esophageal reflux disease without esophagitis: Secondary | ICD-10-CM

## 2016-04-01 DIAGNOSIS — Z111 Encounter for screening for respiratory tuberculosis: Secondary | ICD-10-CM

## 2016-04-01 DIAGNOSIS — F32A Depression, unspecified: Secondary | ICD-10-CM

## 2016-04-01 DIAGNOSIS — F329 Major depressive disorder, single episode, unspecified: Secondary | ICD-10-CM

## 2016-04-01 DIAGNOSIS — E785 Hyperlipidemia, unspecified: Secondary | ICD-10-CM

## 2016-04-01 DIAGNOSIS — E039 Hypothyroidism, unspecified: Secondary | ICD-10-CM

## 2016-04-01 DIAGNOSIS — M810 Age-related osteoporosis without current pathological fracture: Secondary | ICD-10-CM

## 2016-04-01 NOTE — Progress Notes (Signed)
Pre visit review using our clinic review tool, if applicable. No additional management support is needed unless otherwise documented below in the visit note. 

## 2016-04-01 NOTE — Progress Notes (Signed)
PPD placed---Left forearm.  [facility] to read and place patient's second PPD.

## 2016-04-03 MED ORDER — PAROXETINE HCL 20 MG PO TABS: 20.0000 mg | ORAL_TABLET | Freq: Every day | ORAL | 1 refills | Status: AC

## 2016-04-03 MED ORDER — FENOFIBRATE 160 MG PO TABS: 160.0000 mg | ORAL_TABLET | Freq: Every day | ORAL | 1 refills | Status: AC

## 2016-04-03 MED ORDER — ONDANSETRON 4 MG PO TBDP: 4.0000 mg | ORAL_TABLET | Freq: Three times a day (TID) | ORAL | 0 refills | Status: AC | PRN

## 2016-04-03 MED ORDER — LEVOTHYROXINE SODIUM 75 MCG PO TABS: 75.0000 ug | ORAL_TABLET | Freq: Every day | ORAL | 3 refills | Status: AC

## 2016-04-03 MED ORDER — ALENDRONATE SODIUM 70 MG PO TABS: ORAL_TABLET | ORAL | 3 refills | Status: AC

## 2016-04-03 MED ORDER — DIVALPROEX SODIUM 250 MG PO DR TAB: DELAYED_RELEASE_TABLET | ORAL | 5 refills | Status: AC

## 2016-04-03 MED ORDER — PANTOPRAZOLE SODIUM 40 MG PO TBEC: 40.0000 mg | DELAYED_RELEASE_TABLET | Freq: Every day | ORAL | 1 refills | Status: AC

## 2016-04-03 MED ORDER — ATORVASTATIN CALCIUM 40 MG PO TABS: 40.0000 mg | ORAL_TABLET | Freq: Every day | ORAL | 1 refills | Status: AC

## 2016-04-03 NOTE — Progress Notes (Signed)
Patient ID: Amy Bates, female    DOB: 10/17/1945  Age: 71 y.o. MRN: [id number]    Subjective:  Subjective  HPI Amy Bates presents with family to have FL2 filled out so she can be admitted to assisted living.    Review of Systems  Constitutional: Negative for appetite change, diaphoresis, fatigue and unexpected weight change.  Eyes: Negative for pain, redness and visual disturbance.  Respiratory: Negative for cough, chest tightness, shortness of breath and wheezing.   Cardiovascular: Negative for chest pain, palpitations and leg swelling.  Endocrine: Negative for cold intolerance, heat intolerance, polydipsia, polyphagia and polyuria.  Genitourinary: Negative for difficulty urinating, dysuria and frequency.  Neurological: Negative for dizziness, light-headedness, numbness and headaches.  Psychiatric/Behavioral: Positive for agitation, behavioral problems, confusion, decreased concentration, dysphoric mood and sleep disturbance. The patient is nervous/anxious.     History Past Medical History:  Diagnosis Date  . Allergic rhinitis   . Arthritis   . Asthma   . Chest pain    [facility]  . Chronic kidney disease   . Constipation   . COPD (chronic obstructive pulmonary disease) (HCC)   . Depression   . GERD (gastroesophageal reflux disease)   . History of chicken pox   . Hyperlipidemia   . Hypothyroid   . Peripheral vascular disease (HCC)   . Pneumonia   . PRES (posterior reversible encephalopathy syndrome)   . Urinary incontinence     She has a past surgical history that includes Tubal ligation; Tonsillectomy; Knee surgery; Endovascular stent insertion (N/A, 01/11/2013); Abdominal aortic aneurysm repair; and Joint replacement.   Her family history includes AAA (abdominal aortic aneurysm) in her sister; Coronary artery disease in her mother; Heart disease in her father and mother; Multiple sclerosis in her sister; Stroke in her mother.She reports that she has quit  smoking. She started smoking about 3 years ago. She has never used smokeless tobacco. She reports that she does not drink alcohol or use drugs.  Current Outpatient Prescriptions on File Prior to Visit  Medication Sig Dispense Refill  . acetaminophen (TYLENOL) 500 MG tablet Take 500 mg by mouth every 6 (six) hours as needed.    Marland Kitchen. albuterol (PROVENTIL HFA;VENTOLIN HFA) 108 (90 Base) MCG/ACT inhaler Inhale 2 puffs into the lungs every 6 (six) hours as needed for wheezing or shortness of breath. 1 Inhaler 2  . Calcium Carbonate-Vitamin D (CALCIUM-D PO) Take 600-800 mg by mouth daily.    . Ciclopirox 1 % shampoo Massage shampoo in scalp and let sit 5 min and rinse 120 mL 0  . donepezil (ARICEPT) 10 MG tablet Take 1 tablet (10 mg total) by mouth at bedtime. 30 tablet 5  . methylcellulose (CITRUCEL) oral powder Take 1 packet by mouth daily. Dissolve 1 heaping tablespoon  in 8 oz of cold water 454 tablet 0  . psyllium (METAMUCIL) 58.6 % powder Take 1 packet by mouth daily as needed (constipation).     No current facility-administered medications on file prior to visit.      Objective:  Objective  Physical Exam  Constitutional: She is oriented to person, place, and time. She appears well-developed and well-nourished.  HENT:  Head: Normocephalic and atraumatic.  Eyes: Conjunctivae and EOM are normal.  Neck: Normal range of motion. Neck supple. No JVD present. Carotid bruit is not present. No thyromegaly present.  Cardiovascular: Normal rate, regular rhythm and normal heart sounds.   No murmur heard. Pulmonary/Chest: Effort normal and breath sounds normal. No respiratory distress. She  has no wheezes. She has no rales. She exhibits no tenderness.  Musculoskeletal: She exhibits no edema.  Neurological: She is alert and oriented to person, place, and time.  Psychiatric: She has a normal mood and affect.   BP 108/62 (BP Location: Left Arm, Patient Position: Sitting, Cuff Size: Normal)   Pulse 74    Temp 97.6 F (36.4 C) (Oral)   Ht 5\' 6"  (1.676 m)   Wt 131 lb 8 oz (59.6 kg)   SpO2 94%   BMI 21.22 kg/m  Wt Readings from Last 3 Encounters:  04/01/16 131 lb 8 oz (59.6 kg)  03/21/16 130 lb (59 kg)  02/26/16 132 lb 6.4 oz (60.1 kg)     Lab Results  Component Value Date   WBC 5.3 03/21/2016   HGB 15.2 (H) 03/21/2016   HCT 44.9 03/21/2016   PLT 201 03/21/2016   GLUCOSE 97 03/21/2016   CHOL 123 04/12/2015   TRIG 85.0 04/12/2015   HDL 43.70 04/12/2015   LDLDIRECT 153.9 01/31/2013   LDLCALC 62 04/12/2015   ALT 28 03/21/2016   AST 54 (H) 03/21/2016   NA 138 03/21/2016   K 3.5 03/21/2016   CL 105 03/21/2016   CREATININE 1.21 (H) 03/21/2016   BUN 15 03/21/2016   CO2 27 03/21/2016   TSH 2.43 04/12/2015   INR 1.03 10/13/2015   MICROALBUR 1.1 04/12/2015    Ct Head Wo Contrast  Result Date: 03/21/2016 CLINICAL DATA:  Altered mental status.  Vomiting and diarrhea EXAM: CT HEAD WITHOUT CONTRAST TECHNIQUE: Contiguous axial images were obtained from the base of the skull through the vertex without intravenous contrast. COMPARISON:  Head CT 10/16/2015 FINDINGS: Brain: No acute intracranial hemorrhage. No focal mass lesion. No CT evidence of acute infarction. No midline shift or mass effect. No hydrocephalus. Basilar cisterns are patent. There are periventricular and subcortical white matter hypodensities. Generalized cortical atrophy. Vascular: No hyperdense vessel or unexpected calcification. Skull: Normal. Negative for fracture or focal lesion. Sinuses/Orbits: Paranasal sinuses and mastoid air cells are clear. Orbits are clear. Other: None IMPRESSION: 1. No acute intracranial findings. 2. Extensive periventricular white matter microvascular disease 3. Moderate atrophy for age. 4. No change from prior Electronically Signed   By: Genevive Bi M.D.   On: 03/21/2016 11:09     Assessment & Plan:  Plan  I have changed Amy Bates's atorvastatin, fenofibrate, and pantoprazole. I am also  having her maintain her Calcium Carbonate-Vitamin D (CALCIUM-D PO), acetaminophen, methylcellulose, psyllium, albuterol, donepezil, Ciclopirox, alendronate, divalproex, levothyroxine, ondansetron, and PARoxetine.  Meds ordered this encounter  Medications  . alendronate (FOSAMAX) 70 MG tablet    Sig: TAKE 1 TABLET BY MOUTH WITH A FULL GLASS OF WATER ON AN EMPTY STOMACH EVERY 7 DAYS.    Dispense:  12 tablet    Refill:  3    #12 is a 90 day supply, pt will take 4 a month.  Marland Kitchen atorvastatin (LIPITOR) 40 MG tablet    Sig: Take 1 tablet (40 mg total) by mouth daily.    Dispense:  90 tablet    Refill:  1  . divalproex (DEPAKOTE) 250 MG DR tablet    Sig: 1 po qhs prn    Dispense:  30 tablet    Refill:  5  . fenofibrate 160 MG tablet    Sig: Take 1 tablet (160 mg total) by mouth daily.    Dispense:  90 tablet    Refill:  1  . levothyroxine (SYNTHROID, LEVOTHROID) 75 MCG  tablet    Sig: Take 1 tablet (75 mcg total) by mouth daily.    Dispense:  90 tablet    Refill:  3  . ondansetron (ZOFRAN-ODT) 4 MG disintegrating tablet    Sig: Take 1 tablet (4 mg total) by mouth every 8 (eight) hours as needed for nausea or vomiting.    Dispense:  15 tablet    Refill:  0  . pantoprazole (PROTONIX) 40 MG tablet    Sig: Take 1 tablet (40 mg total) by mouth daily.    Dispense:  90 tablet    Refill:  1  . PARoxetine (PAXIL) 20 MG tablet    Sig: Take 1 tablet (20 mg total) by mouth daily.    Dispense:  90 tablet    Refill:  1    Problem List Items Addressed This Visit      Unprioritized   Hypothyroidism   Relevant Medications   levothyroxine (SYNTHROID, LEVOTHROID) 75 MCG tablet   Depression   Relevant Medications   PARoxetine (PAXIL) 20 MG tablet   Hyperlipidemia   Relevant Medications   atorvastatin (LIPITOR) 40 MG tablet   fenofibrate 160 MG tablet   Insomnia   Relevant Medications   divalproex (DEPAKOTE) 250 MG DR tablet    Other Visit Diagnoses    Screening-pulmonary TB    -  Primary    Relevant Orders   PPD (Completed)   Osteoporosis, unspecified osteoporosis type, unspecified pathological fracture presence       Relevant Medications   alendronate (FOSAMAX) 70 MG tablet   Gastroesophageal reflux disease, esophagitis presence not specified       Relevant Medications   ondansetron (ZOFRAN-ODT) 4 MG disintegrating tablet   pantoprazole (PROTONIX) 40 MG tablet    fl2 filled out  Ppd placed  Follow-up: No Follow-up on file.  Donato Schultz, DO

## 2016-04-03 NOTE — Patient Instructions (Signed)
Tuberculin purified protein derivative, PPD injection What is this medicine? TUBERCULIN PURIFIED PROTEIN DERIVATIVE, PPD is a test used to detect if you have a tuberculosis infection. It will not cause tuberculosis infection. COMMON BRAND NAME(S): Aplisol, Tubersol What should I tell my health care provider before I take this medicine? They need to know if you have any of these conditions: -diabetes -HIV or AIDS -immune system problems -infection (especially a virus infection such as chickenpox, cold sores, or herpes) -kidney disease -malnutrition -an unusual or allergic reaction to tuberculin purified protein derivative, PPD, other medicines, foods, dyes, or preservatives -pregnant or trying to get pregnant -breast-feeding How should I use this medicine? This medicine is for injection in the skin. It is given by a health care professional in a hospital or clinic setting. Talk to your pediatrician regarding the use of this medicine in children. While this drug may be prescribed in children, precautions do apply. What if I miss a dose? It is important not to miss your dose. Call your doctor or health care professional if you are unable to keep an appointment. What may interact with this medicine? -adalimumab -anakinra -etanercept -infliximab -live virus vaccines -medicines to treat cancer -steroid medicines like prednisone or cortisone What should I watch for while using this medicine? See your health care provider as directed. This medicine does not protect against tuberculosis. What side effects may I notice from receiving this medicine? Side effects that you should report to your doctor or health care professional as soon as possible: -allergic reactions like skin rash, itching or hives, swelling of the face, lips, or tongue -breathing problems Side effects that usually do not require medical attention (report to your doctor or health care professional if they continue or are  bothersome): -bruising -pain, redness, or irritation at site where injected Where should I keep my medicine? This drug is given in a hospital or clinic and will not be stored at home.  2017 Elsevier/Gold Standard (2015-04-05 11:42:34)  

## 2016-04-04 ENCOUNTER — Other Ambulatory Visit: Payer: Self-pay | Admitting: Family Medicine

## 2016-04-04 ENCOUNTER — Telehealth: Payer: Self-pay | Admitting: Family Medicine

## 2016-04-04 DIAGNOSIS — R4182 Altered mental status, unspecified: Secondary | ICD-10-CM

## 2016-04-04 NOTE — Telephone Encounter (Signed)
Referral done as requested

## 2016-04-04 NOTE — Telephone Encounter (Signed)
Patients daughter states she needs a psychiatric evaluation referral [facility]  [facility] [place]

## 2016-04-04 NOTE — Telephone Encounter (Signed)
Ok to put referral in--- also remind them about [facility] if things get out of hand over weekend--

## 2016-04-04 NOTE — Telephone Encounter (Signed)
Caller name: Stephenie AcresStephanie Louis Relation to pt: daughter Call back number: [phone] Pharmacy:  Reason for call: Pt's daughter called to inform that pt had filled out FL2 paperwork on 04-01-2016 with Dr Laury AxonLowne at her appt, Pt was denied for the living assistance and was recommended [facility], daughter states pt is needing referral to [facility] at [address] [place] [phone]. [facility]. Please advise ASAP.

## 2016-04-04 NOTE — Telephone Encounter (Signed)
I needs to know what kind of referral it is -----as soon as we know we can put it in

## 2016-04-05 DIAGNOSIS — E7800 Pure hypercholesterolemia, unspecified: Secondary | ICD-10-CM | POA: Diagnosis not present

## 2016-04-05 DIAGNOSIS — R4182 Altered mental status, unspecified: Secondary | ICD-10-CM | POA: Diagnosis not present

## 2016-04-05 DIAGNOSIS — R451 Restlessness and agitation: Secondary | ICD-10-CM | POA: Diagnosis not present

## 2016-04-05 DIAGNOSIS — F0391 Unspecified dementia with behavioral disturbance: Secondary | ICD-10-CM | POA: Diagnosis not present

## 2016-04-05 DIAGNOSIS — J302 Other seasonal allergic rhinitis: Secondary | ICD-10-CM | POA: Diagnosis not present

## 2016-04-05 DIAGNOSIS — Z886 Allergy status to analgesic agent status: Secondary | ICD-10-CM | POA: Diagnosis not present

## 2016-04-05 DIAGNOSIS — J449 Chronic obstructive pulmonary disease, unspecified: Secondary | ICD-10-CM | POA: Diagnosis not present

## 2016-04-05 DIAGNOSIS — K5901 Slow transit constipation: Secondary | ICD-10-CM | POA: Diagnosis not present

## 2016-04-05 DIAGNOSIS — E039 Hypothyroidism, unspecified: Secondary | ICD-10-CM | POA: Diagnosis not present

## 2016-04-05 DIAGNOSIS — M810 Age-related osteoporosis without current pathological fracture: Secondary | ICD-10-CM | POA: Diagnosis not present

## 2016-04-05 DIAGNOSIS — Z66 Do not resuscitate: Secondary | ICD-10-CM | POA: Diagnosis not present

## 2016-04-05 DIAGNOSIS — K5900 Constipation, unspecified: Secondary | ICD-10-CM | POA: Diagnosis not present

## 2016-04-05 DIAGNOSIS — E785 Hyperlipidemia, unspecified: Secondary | ICD-10-CM | POA: Diagnosis not present

## 2016-04-05 DIAGNOSIS — Z87891 Personal history of nicotine dependence: Secondary | ICD-10-CM | POA: Diagnosis not present

## 2016-04-05 DIAGNOSIS — N179 Acute kidney failure, unspecified: Secondary | ICD-10-CM | POA: Diagnosis not present

## 2016-04-05 DIAGNOSIS — K219 Gastro-esophageal reflux disease without esophagitis: Secondary | ICD-10-CM | POA: Diagnosis not present

## 2016-04-05 DIAGNOSIS — N183 Chronic kidney disease, stage 3 (moderate): Secondary | ICD-10-CM | POA: Diagnosis not present

## 2016-04-05 DIAGNOSIS — E876 Hypokalemia: Secondary | ICD-10-CM | POA: Diagnosis not present

## 2016-04-05 DIAGNOSIS — Z87820 Personal history of traumatic brain injury: Secondary | ICD-10-CM | POA: Diagnosis not present

## 2016-04-05 DIAGNOSIS — F99 Mental disorder, not otherwise specified: Secondary | ICD-10-CM | POA: Diagnosis not present

## 2016-04-09 ENCOUNTER — Telehealth: Payer: Self-pay | Admitting: Family Medicine

## 2016-04-09 NOTE — Telephone Encounter (Signed)
Called patient to schedule awv. Left msg for patient to call office to schedule appt.  °

## 2016-04-12 DIAGNOSIS — F0391 Unspecified dementia with behavioral disturbance: Secondary | ICD-10-CM | POA: Diagnosis not present

## 2016-04-13 DIAGNOSIS — F0391 Unspecified dementia with behavioral disturbance: Secondary | ICD-10-CM | POA: Diagnosis not present

## 2016-04-14 DIAGNOSIS — F0391 Unspecified dementia with behavioral disturbance: Secondary | ICD-10-CM | POA: Diagnosis not present

## 2016-04-15 DIAGNOSIS — K219 Gastro-esophageal reflux disease without esophagitis: Secondary | ICD-10-CM | POA: Diagnosis not present

## 2016-04-15 DIAGNOSIS — E785 Hyperlipidemia, unspecified: Secondary | ICD-10-CM | POA: Diagnosis not present

## 2016-04-15 DIAGNOSIS — E039 Hypothyroidism, unspecified: Secondary | ICD-10-CM | POA: Diagnosis not present

## 2016-04-15 DIAGNOSIS — M810 Age-related osteoporosis without current pathological fracture: Secondary | ICD-10-CM | POA: Diagnosis not present

## 2016-04-15 DIAGNOSIS — F0391 Unspecified dementia with behavioral disturbance: Secondary | ICD-10-CM | POA: Diagnosis not present

## 2016-04-16 DIAGNOSIS — F0391 Unspecified dementia with behavioral disturbance: Secondary | ICD-10-CM | POA: Diagnosis not present

## 2016-04-21 ENCOUNTER — Ambulatory Visit: Payer: Commercial Managed Care - HMO | Admitting: Family Medicine

## 2017-09-14 DEATH — deceased

## 2021-09-14 DEATH — deceased
